# Patient Record
Sex: Female | Born: 1935 | Race: Black or African American | Hispanic: No | Smoking: Never smoker
Health system: Southern US, Community
[De-identification: ages and names within clinical notes are randomized; demographics above are authoritative.]

## PROBLEM LIST (undated history)

## (undated) DIAGNOSIS — G8929 Other chronic pain: Secondary | ICD-10-CM

## (undated) DIAGNOSIS — Z0000 Encounter for general adult medical examination without abnormal findings: Secondary | ICD-10-CM

## (undated) DIAGNOSIS — N189 Chronic kidney disease, unspecified: Secondary | ICD-10-CM

## (undated) DIAGNOSIS — F5104 Psychophysiologic insomnia: Secondary | ICD-10-CM

## (undated) DIAGNOSIS — M25569 Pain in unspecified knee: Secondary | ICD-10-CM

## (undated) DIAGNOSIS — E1142 Type 2 diabetes mellitus with diabetic polyneuropathy: Secondary | ICD-10-CM

## (undated) DIAGNOSIS — M79642 Pain in left hand: Secondary | ICD-10-CM

## (undated) DIAGNOSIS — I10 Essential (primary) hypertension: Secondary | ICD-10-CM

## (undated) DIAGNOSIS — K2970 Gastritis, unspecified, without bleeding: Secondary | ICD-10-CM

## (undated) DIAGNOSIS — R42 Dizziness and giddiness: Secondary | ICD-10-CM

## (undated) DIAGNOSIS — E785 Hyperlipidemia, unspecified: Secondary | ICD-10-CM

## (undated) DIAGNOSIS — M79641 Pain in right hand: Secondary | ICD-10-CM

## (undated) DIAGNOSIS — M25562 Pain in left knee: Secondary | ICD-10-CM

## (undated) DIAGNOSIS — D649 Anemia, unspecified: Secondary | ICD-10-CM

## (undated) DIAGNOSIS — Z8639 Personal history of other endocrine, nutritional and metabolic disease: Principal | ICD-10-CM

## (undated) DIAGNOSIS — M1990 Unspecified osteoarthritis, unspecified site: Secondary | ICD-10-CM

## (undated) HISTORY — DX: Essential (primary) hypertension: I10

## (undated) HISTORY — DX: Hyperlipidemia, unspecified: E78.5

## (undated) HISTORY — DX: Pain in left hand: M79.642

## (undated) HISTORY — DX: Anemia, unspecified: D64.9

## (undated) HISTORY — DX: Psychophysiologic insomnia: F51.04

## (undated) HISTORY — DX: Gastritis, unspecified, without bleeding: K29.70

## (undated) HISTORY — DX: Unspecified osteoarthritis, unspecified site: M19.90

## (undated) HISTORY — DX: Type 2 diabetes mellitus with diabetic polyneuropathy: E11.42

## (undated) HISTORY — DX: Personal history of other endocrine, nutritional and metabolic disease: Z86.39

## (undated) HISTORY — DX: Pain in unspecified knee: M25.569

## (undated) HISTORY — DX: Pain in left knee: M25.562

## (undated) HISTORY — DX: Pain in right hand: M79.641

## (undated) HISTORY — PX: COLONOSCOPY: SHX174

## (undated) HISTORY — DX: Dizziness and giddiness: R42

## (undated) HISTORY — DX: Chronic kidney disease, unspecified: N18.9

## (undated) HISTORY — DX: Other chronic pain: G89.29

## (undated) HISTORY — DX: Encounter for general adult medical examination without abnormal findings: Z00.00

## (undated) HISTORY — PX: ABDOMINAL HYSTERECTOMY: SHX81

---

## 1997-12-09 ENCOUNTER — Ambulatory Visit (HOSPITAL_BASED_OUTPATIENT_CLINIC_OR_DEPARTMENT_OTHER): Admission: RE | Admit: 1997-12-09 | Discharge: 1997-12-09 | Payer: Self-pay | Admitting: Orthopedic Surgery

## 1998-01-31 ENCOUNTER — Encounter: Admission: RE | Admit: 1998-01-31 | Discharge: 1998-01-31 | Payer: Self-pay | Admitting: Internal Medicine

## 1998-04-12 ENCOUNTER — Encounter: Admission: RE | Admit: 1998-04-12 | Discharge: 1998-04-12 | Payer: Self-pay | Admitting: Hematology and Oncology

## 1998-05-10 ENCOUNTER — Encounter: Admission: RE | Admit: 1998-05-10 | Discharge: 1998-05-10 | Payer: Self-pay | Admitting: Hematology and Oncology

## 1998-06-23 ENCOUNTER — Encounter: Admission: RE | Admit: 1998-06-23 | Discharge: 1998-06-23 | Payer: Self-pay | Admitting: Internal Medicine

## 1998-07-18 ENCOUNTER — Ambulatory Visit (HOSPITAL_COMMUNITY): Admission: RE | Admit: 1998-07-18 | Discharge: 1998-07-18 | Payer: Self-pay | Admitting: *Deleted

## 1998-07-24 ENCOUNTER — Ambulatory Visit (HOSPITAL_COMMUNITY): Admission: RE | Admit: 1998-07-24 | Discharge: 1998-07-24 | Payer: Self-pay

## 1998-11-21 ENCOUNTER — Emergency Department (HOSPITAL_COMMUNITY): Admission: EM | Admit: 1998-11-21 | Discharge: 1998-11-21 | Payer: Self-pay | Admitting: Emergency Medicine

## 1998-11-22 ENCOUNTER — Emergency Department (HOSPITAL_COMMUNITY): Admission: EM | Admit: 1998-11-22 | Discharge: 1998-11-22 | Payer: Self-pay | Admitting: Emergency Medicine

## 1999-07-24 ENCOUNTER — Encounter: Admission: RE | Admit: 1999-07-24 | Discharge: 1999-07-24 | Payer: Self-pay | Admitting: Internal Medicine

## 1999-07-27 ENCOUNTER — Encounter: Payer: Self-pay | Admitting: *Deleted

## 1999-07-27 ENCOUNTER — Ambulatory Visit (HOSPITAL_COMMUNITY): Admission: RE | Admit: 1999-07-27 | Discharge: 1999-07-27 | Payer: Self-pay | Admitting: *Deleted

## 1999-08-07 ENCOUNTER — Encounter: Admission: RE | Admit: 1999-08-07 | Discharge: 1999-08-07 | Payer: Self-pay | Admitting: Hematology and Oncology

## 2000-01-14 ENCOUNTER — Encounter: Admission: RE | Admit: 2000-01-14 | Discharge: 2000-01-14 | Payer: Self-pay | Admitting: Internal Medicine

## 2000-07-22 ENCOUNTER — Encounter: Admission: RE | Admit: 2000-07-22 | Discharge: 2000-07-22 | Payer: Self-pay | Admitting: Internal Medicine

## 2000-07-22 ENCOUNTER — Encounter: Payer: Self-pay | Admitting: Internal Medicine

## 2000-07-22 ENCOUNTER — Ambulatory Visit (HOSPITAL_COMMUNITY): Admission: RE | Admit: 2000-07-22 | Discharge: 2000-07-22 | Payer: Self-pay | Admitting: Internal Medicine

## 2000-08-04 ENCOUNTER — Encounter: Admission: RE | Admit: 2000-08-04 | Discharge: 2000-08-04 | Payer: Self-pay | Admitting: Internal Medicine

## 2001-01-05 ENCOUNTER — Emergency Department (HOSPITAL_COMMUNITY): Admission: EM | Admit: 2001-01-05 | Discharge: 2001-01-05 | Payer: Self-pay | Admitting: Emergency Medicine

## 2001-04-17 ENCOUNTER — Encounter: Admission: RE | Admit: 2001-04-17 | Discharge: 2001-04-17 | Payer: Self-pay | Admitting: Internal Medicine

## 2002-05-12 ENCOUNTER — Encounter: Admission: RE | Admit: 2002-05-12 | Discharge: 2002-05-12 | Payer: Self-pay | Admitting: Internal Medicine

## 2002-05-12 ENCOUNTER — Ambulatory Visit (HOSPITAL_COMMUNITY): Admission: RE | Admit: 2002-05-12 | Discharge: 2002-05-12 | Payer: Self-pay | Admitting: Internal Medicine

## 2002-05-31 ENCOUNTER — Encounter: Admission: RE | Admit: 2002-05-31 | Discharge: 2002-05-31 | Payer: Self-pay | Admitting: Internal Medicine

## 2002-06-21 ENCOUNTER — Encounter: Admission: RE | Admit: 2002-06-21 | Discharge: 2002-06-21 | Payer: Self-pay | Admitting: Internal Medicine

## 2002-07-16 ENCOUNTER — Encounter: Admission: RE | Admit: 2002-07-16 | Discharge: 2002-07-16 | Payer: Self-pay | Admitting: Internal Medicine

## 2002-11-15 ENCOUNTER — Encounter: Admission: RE | Admit: 2002-11-15 | Discharge: 2002-11-15 | Payer: Self-pay | Admitting: Internal Medicine

## 2003-01-24 ENCOUNTER — Encounter: Admission: RE | Admit: 2003-01-24 | Discharge: 2003-01-24 | Payer: Self-pay | Admitting: Internal Medicine

## 2003-02-07 ENCOUNTER — Encounter: Admission: RE | Admit: 2003-02-07 | Discharge: 2003-02-07 | Payer: Self-pay | Admitting: Internal Medicine

## 2003-02-17 ENCOUNTER — Encounter: Admission: RE | Admit: 2003-02-17 | Discharge: 2003-02-17 | Payer: Self-pay | Admitting: Internal Medicine

## 2004-02-07 ENCOUNTER — Ambulatory Visit: Payer: Self-pay | Admitting: Internal Medicine

## 2004-02-20 ENCOUNTER — Ambulatory Visit: Payer: Self-pay | Admitting: Internal Medicine

## 2004-02-20 ENCOUNTER — Ambulatory Visit (HOSPITAL_COMMUNITY): Admission: RE | Admit: 2004-02-20 | Discharge: 2004-02-20 | Payer: Self-pay | Admitting: Internal Medicine

## 2004-02-21 ENCOUNTER — Ambulatory Visit: Payer: Self-pay | Admitting: Internal Medicine

## 2004-03-05 ENCOUNTER — Ambulatory Visit: Payer: Self-pay | Admitting: Internal Medicine

## 2004-04-07 ENCOUNTER — Emergency Department (HOSPITAL_COMMUNITY): Admission: EM | Admit: 2004-04-07 | Discharge: 2004-04-08 | Payer: Self-pay

## 2004-07-09 ENCOUNTER — Ambulatory Visit: Payer: Self-pay | Admitting: Internal Medicine

## 2004-07-12 ENCOUNTER — Ambulatory Visit: Payer: Self-pay | Admitting: Internal Medicine

## 2004-10-11 ENCOUNTER — Ambulatory Visit: Payer: Self-pay | Admitting: Internal Medicine

## 2004-10-11 ENCOUNTER — Ambulatory Visit (HOSPITAL_COMMUNITY): Admission: RE | Admit: 2004-10-11 | Discharge: 2004-10-11 | Payer: Self-pay | Admitting: Internal Medicine

## 2004-10-22 ENCOUNTER — Ambulatory Visit: Payer: Self-pay | Admitting: Internal Medicine

## 2004-10-25 ENCOUNTER — Ambulatory Visit: Payer: Self-pay | Admitting: Internal Medicine

## 2004-11-12 ENCOUNTER — Ambulatory Visit: Payer: Self-pay | Admitting: Internal Medicine

## 2004-11-14 ENCOUNTER — Ambulatory Visit (HOSPITAL_COMMUNITY): Admission: RE | Admit: 2004-11-14 | Discharge: 2004-11-14 | Payer: Self-pay | Admitting: Internal Medicine

## 2004-11-20 ENCOUNTER — Ambulatory Visit: Payer: Self-pay | Admitting: Gastroenterology

## 2004-11-20 ENCOUNTER — Encounter (INDEPENDENT_AMBULATORY_CARE_PROVIDER_SITE_OTHER): Payer: Self-pay | Admitting: *Deleted

## 2004-11-26 ENCOUNTER — Ambulatory Visit: Payer: Self-pay | Admitting: Internal Medicine

## 2004-12-06 ENCOUNTER — Encounter (INDEPENDENT_AMBULATORY_CARE_PROVIDER_SITE_OTHER): Payer: Self-pay | Admitting: *Deleted

## 2004-12-06 ENCOUNTER — Ambulatory Visit: Payer: Self-pay | Admitting: Gastroenterology

## 2004-12-13 ENCOUNTER — Ambulatory Visit: Payer: Self-pay | Admitting: Internal Medicine

## 2004-12-27 ENCOUNTER — Ambulatory Visit (HOSPITAL_COMMUNITY): Admission: RE | Admit: 2004-12-27 | Discharge: 2004-12-27 | Payer: Self-pay | Admitting: Gastroenterology

## 2005-01-01 ENCOUNTER — Encounter (INDEPENDENT_AMBULATORY_CARE_PROVIDER_SITE_OTHER): Payer: Self-pay | Admitting: *Deleted

## 2005-05-23 ENCOUNTER — Ambulatory Visit: Payer: Self-pay | Admitting: Internal Medicine

## 2005-05-31 ENCOUNTER — Ambulatory Visit: Payer: Self-pay | Admitting: Internal Medicine

## 2005-09-22 ENCOUNTER — Emergency Department (HOSPITAL_COMMUNITY): Admission: EM | Admit: 2005-09-22 | Discharge: 2005-09-22 | Payer: Self-pay | Admitting: Emergency Medicine

## 2006-02-13 DIAGNOSIS — E785 Hyperlipidemia, unspecified: Secondary | ICD-10-CM | POA: Insufficient documentation

## 2006-02-13 DIAGNOSIS — R42 Dizziness and giddiness: Secondary | ICD-10-CM

## 2006-02-13 DIAGNOSIS — E1140 Type 2 diabetes mellitus with diabetic neuropathy, unspecified: Secondary | ICD-10-CM | POA: Insufficient documentation

## 2006-02-13 DIAGNOSIS — I10 Essential (primary) hypertension: Secondary | ICD-10-CM | POA: Insufficient documentation

## 2006-02-13 DIAGNOSIS — K2990 Gastroduodenitis, unspecified, without bleeding: Secondary | ICD-10-CM

## 2006-02-13 DIAGNOSIS — M1990 Unspecified osteoarthritis, unspecified site: Secondary | ICD-10-CM | POA: Insufficient documentation

## 2006-02-13 DIAGNOSIS — K2970 Gastritis, unspecified, without bleeding: Secondary | ICD-10-CM | POA: Insufficient documentation

## 2006-03-07 ENCOUNTER — Encounter (INDEPENDENT_AMBULATORY_CARE_PROVIDER_SITE_OTHER): Payer: Self-pay | Admitting: *Deleted

## 2006-03-07 ENCOUNTER — Ambulatory Visit: Payer: Self-pay | Admitting: Internal Medicine

## 2006-03-07 LAB — CONVERTED CEMR LAB
Cholesterol: 162 mg/dL (ref 0–200)
Microalb Creat Ratio: 7.1 mg/g (ref 0.0–30.0)
Total CHOL/HDL Ratio: 2.7
Urine Glucose: NEGATIVE mg/dL
WBC, UA: NONE SEEN cells/hpf (ref <3)
pH: 6.5 (ref 5.0–8.0)

## 2006-03-21 ENCOUNTER — Ambulatory Visit (HOSPITAL_COMMUNITY): Admission: RE | Admit: 2006-03-21 | Discharge: 2006-03-21 | Payer: Self-pay | Admitting: Internal Medicine

## 2006-03-24 ENCOUNTER — Ambulatory Visit: Payer: Self-pay | Admitting: Hospitalist

## 2006-03-24 ENCOUNTER — Encounter (INDEPENDENT_AMBULATORY_CARE_PROVIDER_SITE_OTHER): Payer: Self-pay | Admitting: *Deleted

## 2006-03-24 LAB — CONVERTED CEMR LAB
Platelets: 223 10*3/uL (ref 152–374)
WBC: 5.3 10*3/uL (ref 3.7–10.0)

## 2006-07-28 ENCOUNTER — Telehealth (INDEPENDENT_AMBULATORY_CARE_PROVIDER_SITE_OTHER): Payer: Self-pay | Admitting: *Deleted

## 2006-10-20 ENCOUNTER — Telehealth: Payer: Self-pay | Admitting: *Deleted

## 2006-10-20 DIAGNOSIS — D649 Anemia, unspecified: Secondary | ICD-10-CM | POA: Insufficient documentation

## 2006-11-14 ENCOUNTER — Ambulatory Visit: Payer: Self-pay | Admitting: Hospitalist

## 2006-11-14 ENCOUNTER — Encounter (INDEPENDENT_AMBULATORY_CARE_PROVIDER_SITE_OTHER): Payer: Self-pay | Admitting: *Deleted

## 2006-11-14 LAB — CONVERTED CEMR LAB
Bilirubin Urine: NEGATIVE
Glucose, Urine, Semiquant: NEGATIVE
Hgb A1c MFr Bld: 6.1 %
Specific Gravity, Urine: <1.005

## 2007-03-03 ENCOUNTER — Encounter (INDEPENDENT_AMBULATORY_CARE_PROVIDER_SITE_OTHER): Payer: Self-pay | Admitting: *Deleted

## 2007-03-03 ENCOUNTER — Ambulatory Visit: Payer: Self-pay | Admitting: Hospitalist

## 2007-03-03 LAB — CONVERTED CEMR LAB
ALT: 29 units/L (ref 0–35)
BUN: 20 mg/dL (ref 6–23)
Basophils Absolute: 0 10*3/uL (ref 0.0–0.1)
Basophils Relative: 1 % (ref 0–1)
Bilirubin Urine: NEGATIVE
CO2: 22 meq/L (ref 19–32)
Cholesterol: 151 mg/dL (ref 0–200)
Creatinine, Ser: 1.52 mg/dL — ABNORMAL HIGH (ref 0.40–1.20)
Eosinophils Relative: 5 % (ref 0–5)
HCT: 33.3 % — ABNORMAL LOW (ref 36.0–46.0)
HDL: 53 mg/dL (ref >39)
Hemoglobin, Urine: NEGATIVE
Hemoglobin: 10.9 g/dL — ABNORMAL LOW (ref 12.0–15.0)
Hgb A1c MFr Bld: 5.9 %
Ketones, ur: NEGATIVE mg/dL
Lymphocytes Relative: 29 % (ref 12–46)
MCHC: 32.7 g/dL (ref 30.0–36.0)
Monocytes Absolute: 0.5 10*3/uL (ref 0.2–0.7)
Protein, ur: NEGATIVE mg/dL
RDW: 13.7 % (ref 11.5–14.0)
Total Bilirubin: 0.5 mg/dL (ref 0.3–1.2)
Total CHOL/HDL Ratio: 2.8
Urine Glucose: NEGATIVE mg/dL
VLDL: 17 mg/dL (ref 0–40)

## 2007-03-06 ENCOUNTER — Ambulatory Visit (HOSPITAL_COMMUNITY): Admission: RE | Admit: 2007-03-06 | Discharge: 2007-03-06 | Payer: Self-pay | Admitting: *Deleted

## 2007-03-06 ENCOUNTER — Ambulatory Visit: Payer: Self-pay | Admitting: *Deleted

## 2007-03-06 LAB — CONVERTED CEMR LAB
Blood Glucose, Fingerstick: 103
Blood Glucose, Home Monitor: 1 mg/dL

## 2007-05-14 ENCOUNTER — Telehealth (INDEPENDENT_AMBULATORY_CARE_PROVIDER_SITE_OTHER): Payer: Self-pay | Admitting: *Deleted

## 2007-06-10 ENCOUNTER — Telehealth (INDEPENDENT_AMBULATORY_CARE_PROVIDER_SITE_OTHER): Payer: Self-pay | Admitting: *Deleted

## 2007-06-15 ENCOUNTER — Telehealth (INDEPENDENT_AMBULATORY_CARE_PROVIDER_SITE_OTHER): Payer: Self-pay | Admitting: *Deleted

## 2007-06-18 ENCOUNTER — Ambulatory Visit: Payer: Self-pay | Admitting: Internal Medicine

## 2007-06-18 ENCOUNTER — Encounter (INDEPENDENT_AMBULATORY_CARE_PROVIDER_SITE_OTHER): Payer: Self-pay | Admitting: *Deleted

## 2007-06-18 LAB — CONVERTED CEMR LAB
Blood Glucose, Fingerstick: 105
Hgb A1c MFr Bld: 5.6 %

## 2007-08-15 ENCOUNTER — Emergency Department (HOSPITAL_COMMUNITY): Admission: EM | Admit: 2007-08-15 | Discharge: 2007-08-15 | Payer: Self-pay | Admitting: Emergency Medicine

## 2007-10-12 ENCOUNTER — Telehealth (INDEPENDENT_AMBULATORY_CARE_PROVIDER_SITE_OTHER): Payer: Self-pay | Admitting: *Deleted

## 2007-11-30 ENCOUNTER — Ambulatory Visit (HOSPITAL_COMMUNITY): Admission: RE | Admit: 2007-11-30 | Discharge: 2007-11-30 | Payer: Self-pay | Admitting: Infectious Diseases

## 2007-11-30 ENCOUNTER — Ambulatory Visit: Payer: Self-pay | Admitting: Infectious Diseases

## 2007-11-30 ENCOUNTER — Encounter: Payer: Self-pay | Admitting: Internal Medicine

## 2007-11-30 DIAGNOSIS — M25569 Pain in unspecified knee: Secondary | ICD-10-CM | POA: Insufficient documentation

## 2007-11-30 LAB — CONVERTED CEMR LAB
Alkaline Phosphatase: 83 units/L (ref 39–117)
BUN: 28 mg/dL — ABNORMAL HIGH (ref 6–23)
CO2: 21 meq/L (ref 19–32)
Glucose, Bld: 99 mg/dL (ref 70–99)
Total Bilirubin: 0.4 mg/dL (ref 0.3–1.2)

## 2007-12-11 ENCOUNTER — Ambulatory Visit (HOSPITAL_COMMUNITY): Admission: RE | Admit: 2007-12-11 | Discharge: 2007-12-11 | Payer: Self-pay | Admitting: Internal Medicine

## 2008-03-17 ENCOUNTER — Telehealth (INDEPENDENT_AMBULATORY_CARE_PROVIDER_SITE_OTHER): Payer: Self-pay | Admitting: Internal Medicine

## 2008-07-18 ENCOUNTER — Telehealth: Payer: Self-pay | Admitting: Internal Medicine

## 2008-08-09 ENCOUNTER — Encounter: Payer: Self-pay | Admitting: Internal Medicine

## 2008-08-09 ENCOUNTER — Ambulatory Visit: Payer: Self-pay | Admitting: Internal Medicine

## 2008-08-09 LAB — CONVERTED CEMR LAB
Blood Glucose, AC Bkfst: 85 mg/dL
Hgb A1c MFr Bld: 5.8 %

## 2008-08-12 LAB — CONVERTED CEMR LAB
CO2: 22 meq/L (ref 19–32)
Calcium: 9.6 mg/dL (ref 8.4–10.5)
Chloride: 105 meq/L (ref 96–112)
Cholesterol: 167 mg/dL (ref 0–200)
Creatinine, Ser: 1.23 mg/dL — ABNORMAL HIGH (ref 0.40–1.20)
Creatinine, Urine: 82.4 mg/dL
GFR calc non Af Amer: 42 mL/min — ABNORMAL LOW (ref >60)
LDL Cholesterol: 89 mg/dL (ref 0–99)
Microalb, Ur: 0.5 mg/dL (ref 0.00–1.89)
Sodium: 140 meq/L (ref 135–145)
Triglycerides: 87 mg/dL (ref <150)
VLDL: 17 mg/dL (ref 0–40)

## 2008-08-26 ENCOUNTER — Ambulatory Visit: Payer: Self-pay | Admitting: Internal Medicine

## 2008-08-26 ENCOUNTER — Encounter (INDEPENDENT_AMBULATORY_CARE_PROVIDER_SITE_OTHER): Payer: Self-pay | Admitting: Internal Medicine

## 2008-08-29 DIAGNOSIS — N182 Chronic kidney disease, stage 2 (mild): Secondary | ICD-10-CM | POA: Insufficient documentation

## 2008-08-29 LAB — CONVERTED CEMR LAB
CO2: 25 meq/L (ref 19–32)
Chloride: 108 meq/L (ref 96–112)
Creatinine, Ser: 1.39 mg/dL — ABNORMAL HIGH (ref 0.40–1.20)
GFR calc non Af Amer: 37 mL/min — ABNORMAL LOW (ref >60)
Potassium: 4.7 meq/L (ref 3.5–5.3)

## 2008-08-30 ENCOUNTER — Encounter: Payer: Self-pay | Admitting: Internal Medicine

## 2008-08-30 ENCOUNTER — Telehealth (INDEPENDENT_AMBULATORY_CARE_PROVIDER_SITE_OTHER): Payer: Self-pay | Admitting: Internal Medicine

## 2008-09-02 ENCOUNTER — Ambulatory Visit: Payer: Self-pay | Admitting: Sports Medicine

## 2008-09-02 ENCOUNTER — Encounter (INDEPENDENT_AMBULATORY_CARE_PROVIDER_SITE_OTHER): Payer: Self-pay | Admitting: *Deleted

## 2008-09-28 ENCOUNTER — Telehealth (INDEPENDENT_AMBULATORY_CARE_PROVIDER_SITE_OTHER): Payer: Self-pay | Admitting: Internal Medicine

## 2008-10-03 ENCOUNTER — Encounter (INDEPENDENT_AMBULATORY_CARE_PROVIDER_SITE_OTHER): Payer: Self-pay | Admitting: Internal Medicine

## 2008-10-03 ENCOUNTER — Encounter: Payer: Self-pay | Admitting: Internal Medicine

## 2008-10-03 ENCOUNTER — Ambulatory Visit: Payer: Self-pay | Admitting: Internal Medicine

## 2008-10-04 LAB — CONVERTED CEMR LAB
BUN: 27 mg/dL — ABNORMAL HIGH
CO2: 22 meq/L
Calcium: 9.3 mg/dL
Chloride: 109 meq/L
Creatinine, Ser: 1.53 mg/dL — ABNORMAL HIGH
GFR calc Af Amer: 40 mL/min — ABNORMAL LOW
GFR calc non Af Amer: 33 mL/min — ABNORMAL LOW
Glucose, Bld: 99 mg/dL
Potassium: 4.4 meq/L
Sodium: 143 meq/L

## 2008-10-05 ENCOUNTER — Telehealth: Payer: Self-pay | Admitting: Internal Medicine

## 2008-10-06 ENCOUNTER — Telehealth: Payer: Self-pay | Admitting: *Deleted

## 2008-10-17 ENCOUNTER — Ambulatory Visit: Payer: Self-pay | Admitting: Internal Medicine

## 2008-10-17 ENCOUNTER — Encounter: Payer: Self-pay | Admitting: Internal Medicine

## 2008-10-17 LAB — CONVERTED CEMR LAB
BUN: 16 mg/dL (ref 6–23)
CO2: 26 meq/L (ref 19–32)
Chloride: 110 meq/L (ref 96–112)
Glucose, Bld: 98 mg/dL (ref 70–99)
Potassium: 3.8 meq/L (ref 3.5–5.3)
Sodium: 142 meq/L (ref 135–145)

## 2008-11-23 ENCOUNTER — Telehealth: Payer: Self-pay | Admitting: Internal Medicine

## 2008-11-25 ENCOUNTER — Ambulatory Visit (HOSPITAL_COMMUNITY): Admission: RE | Admit: 2008-11-25 | Discharge: 2008-11-25 | Payer: Self-pay | Admitting: Internal Medicine

## 2008-11-25 ENCOUNTER — Ambulatory Visit: Payer: Self-pay | Admitting: Internal Medicine

## 2008-11-25 ENCOUNTER — Ambulatory Visit: Payer: Self-pay | Admitting: *Deleted

## 2008-11-25 ENCOUNTER — Encounter: Payer: Self-pay | Admitting: Internal Medicine

## 2008-11-25 DIAGNOSIS — H60339 Swimmer's ear, unspecified ear: Secondary | ICD-10-CM | POA: Insufficient documentation

## 2008-11-25 DIAGNOSIS — R609 Edema, unspecified: Secondary | ICD-10-CM

## 2008-11-25 LAB — CONVERTED CEMR LAB: Blood Glucose, Fingerstick: 81

## 2008-11-28 ENCOUNTER — Ambulatory Visit: Payer: Self-pay | Admitting: Internal Medicine

## 2008-11-28 LAB — CONVERTED CEMR LAB
Blood Glucose, Fingerstick: 75
Hgb A1c MFr Bld: 5.9 %

## 2008-11-29 ENCOUNTER — Telehealth: Payer: Self-pay | Admitting: Internal Medicine

## 2008-11-30 ENCOUNTER — Encounter: Payer: Self-pay | Admitting: Internal Medicine

## 2009-01-05 ENCOUNTER — Ambulatory Visit (HOSPITAL_COMMUNITY): Admission: RE | Admit: 2009-01-05 | Discharge: 2009-01-05 | Payer: Self-pay | Admitting: Internal Medicine

## 2009-01-13 ENCOUNTER — Encounter: Admission: RE | Admit: 2009-01-13 | Discharge: 2009-01-13 | Payer: Self-pay | Admitting: Internal Medicine

## 2009-02-13 ENCOUNTER — Telehealth: Payer: Self-pay | Admitting: Internal Medicine

## 2009-02-17 ENCOUNTER — Encounter: Payer: Self-pay | Admitting: Internal Medicine

## 2009-03-13 ENCOUNTER — Telehealth: Payer: Self-pay | Admitting: Internal Medicine

## 2009-03-14 ENCOUNTER — Telehealth: Payer: Self-pay | Admitting: Internal Medicine

## 2009-04-17 ENCOUNTER — Telehealth: Payer: Self-pay | Admitting: Internal Medicine

## 2009-04-19 ENCOUNTER — Telehealth: Payer: Self-pay | Admitting: Internal Medicine

## 2009-05-17 ENCOUNTER — Telehealth: Payer: Self-pay | Admitting: Internal Medicine

## 2009-06-07 ENCOUNTER — Telehealth: Payer: Self-pay | Admitting: *Deleted

## 2009-06-13 ENCOUNTER — Ambulatory Visit: Payer: Self-pay | Admitting: Internal Medicine

## 2009-06-13 LAB — CONVERTED CEMR LAB
Bilirubin Urine: NEGATIVE
Glucose, Urine, Semiquant: NEGATIVE
Hgb A1c MFr Bld: 6.3 %
Protein, U semiquant: NEGATIVE
Specific Gravity, Urine: 1.005
WBC Urine, dipstick: NEGATIVE
pH: 5

## 2009-06-14 LAB — CONVERTED CEMR LAB
ALT: 13 units/L (ref 0–35)
AST: 18 units/L (ref 0–37)
Calcium: 9.5 mg/dL (ref 8.4–10.5)
Chloride: 104 meq/L (ref 96–112)
Creatinine, Ser: 1.33 mg/dL — ABNORMAL HIGH (ref 0.40–1.20)
HCT: 34.4 % — ABNORMAL LOW (ref 36.0–46.0)
Platelets: 254 10*3/uL (ref 150–400)
RDW: 13.9 % (ref 11.5–15.5)
Sodium: 141 meq/L (ref 135–145)
TSH: 2.611 microintl units/mL (ref 0.350–4.5)
Total Bilirubin: 0.4 mg/dL (ref 0.3–1.2)
Total CHOL/HDL Ratio: 2.7
Total Protein: 7.7 g/dL (ref 6.0–8.3)
VLDL: 22 mg/dL (ref 0–40)
WBC: 4.9 10*3/uL (ref 4.0–10.5)

## 2009-07-20 ENCOUNTER — Telehealth: Payer: Self-pay | Admitting: Internal Medicine

## 2009-08-14 ENCOUNTER — Telehealth: Payer: Self-pay | Admitting: Internal Medicine

## 2009-09-14 ENCOUNTER — Telehealth: Payer: Self-pay | Admitting: Internal Medicine

## 2009-09-15 ENCOUNTER — Telehealth: Payer: Self-pay | Admitting: Internal Medicine

## 2009-09-20 ENCOUNTER — Ambulatory Visit: Payer: Self-pay | Admitting: Internal Medicine

## 2009-09-20 LAB — CONVERTED CEMR LAB
Blood Glucose, AC Bkfst: 105 mg/dL
Hgb A1c MFr Bld: 6.3 %

## 2009-09-21 ENCOUNTER — Encounter: Payer: Self-pay | Admitting: Internal Medicine

## 2009-09-27 ENCOUNTER — Ambulatory Visit (HOSPITAL_COMMUNITY): Admission: RE | Admit: 2009-09-27 | Discharge: 2009-09-27 | Payer: Self-pay | Admitting: Internal Medicine

## 2009-09-28 ENCOUNTER — Encounter: Payer: Self-pay | Admitting: Internal Medicine

## 2009-09-29 ENCOUNTER — Telehealth: Payer: Self-pay | Admitting: *Deleted

## 2009-09-29 DIAGNOSIS — M810 Age-related osteoporosis without current pathological fracture: Secondary | ICD-10-CM | POA: Insufficient documentation

## 2009-10-06 ENCOUNTER — Telehealth: Payer: Self-pay | Admitting: Internal Medicine

## 2009-10-19 ENCOUNTER — Ambulatory Visit: Payer: Self-pay | Admitting: Internal Medicine

## 2009-10-19 DIAGNOSIS — E1149 Type 2 diabetes mellitus with other diabetic neurological complication: Secondary | ICD-10-CM | POA: Insufficient documentation

## 2009-10-25 ENCOUNTER — Telehealth: Payer: Self-pay | Admitting: Internal Medicine

## 2009-10-25 ENCOUNTER — Encounter (INDEPENDENT_AMBULATORY_CARE_PROVIDER_SITE_OTHER): Payer: Self-pay | Admitting: *Deleted

## 2009-10-26 ENCOUNTER — Ambulatory Visit: Payer: Self-pay | Admitting: Internal Medicine

## 2009-10-27 ENCOUNTER — Telehealth: Payer: Self-pay | Admitting: Internal Medicine

## 2009-10-27 LAB — CONVERTED CEMR LAB
Albumin: 4.3 g/dL (ref 3.5–5.2)
BUN: 23 mg/dL (ref 6–23)
Calcium: 9.5 mg/dL (ref 8.4–10.5)
Chloride: 104 meq/L (ref 96–112)
Glucose, Bld: 105 mg/dL — ABNORMAL HIGH (ref 70–99)
HDL: 48 mg/dL (ref >39)
Hemoglobin: 11.1 g/dL — ABNORMAL LOW (ref 12.0–15.0)
LDL Cholesterol: 73 mg/dL (ref 0–99)
Potassium: 4.3 meq/L (ref 3.5–5.3)
RBC: 3.55 M/uL — ABNORMAL LOW (ref 3.87–5.11)
Total CHOL/HDL Ratio: 3
Triglycerides: 111 mg/dL (ref <150)
VLDL: 22 mg/dL (ref 0–40)
WBC: 4.4 10*3/uL (ref 4.0–10.5)

## 2009-10-31 ENCOUNTER — Encounter: Payer: Self-pay | Admitting: Internal Medicine

## 2009-11-03 ENCOUNTER — Telehealth: Payer: Self-pay | Admitting: Internal Medicine

## 2009-11-08 ENCOUNTER — Telehealth: Payer: Self-pay | Admitting: *Deleted

## 2009-11-09 ENCOUNTER — Telehealth: Payer: Self-pay | Admitting: *Deleted

## 2009-11-13 ENCOUNTER — Encounter (INDEPENDENT_AMBULATORY_CARE_PROVIDER_SITE_OTHER): Payer: Self-pay | Admitting: *Deleted

## 2009-11-15 ENCOUNTER — Ambulatory Visit: Payer: Self-pay | Admitting: Gastroenterology

## 2009-11-15 ENCOUNTER — Telehealth: Payer: Self-pay | Admitting: Internal Medicine

## 2009-11-15 ENCOUNTER — Encounter (INDEPENDENT_AMBULATORY_CARE_PROVIDER_SITE_OTHER): Payer: Self-pay | Admitting: *Deleted

## 2009-11-29 ENCOUNTER — Ambulatory Visit: Payer: Self-pay | Admitting: Gastroenterology

## 2009-12-29 ENCOUNTER — Telehealth: Payer: Self-pay | Admitting: Internal Medicine

## 2010-02-20 ENCOUNTER — Ambulatory Visit (HOSPITAL_COMMUNITY): Admission: RE | Admit: 2010-02-20 | Discharge: 2010-02-20 | Payer: Self-pay | Admitting: Internal Medicine

## 2010-03-05 ENCOUNTER — Encounter: Payer: Self-pay | Admitting: Internal Medicine

## 2010-03-06 ENCOUNTER — Encounter: Admission: RE | Admit: 2010-03-06 | Discharge: 2010-03-06 | Payer: Self-pay | Admitting: Internal Medicine

## 2010-03-12 ENCOUNTER — Telehealth: Payer: Self-pay | Admitting: Internal Medicine

## 2010-03-12 ENCOUNTER — Encounter: Admission: RE | Admit: 2010-03-12 | Discharge: 2010-03-12 | Payer: Self-pay | Admitting: Internal Medicine

## 2010-03-12 ENCOUNTER — Encounter: Payer: Self-pay | Admitting: Internal Medicine

## 2010-03-13 ENCOUNTER — Telehealth: Payer: Self-pay | Admitting: Internal Medicine

## 2010-03-29 HISTORY — PX: OTHER SURGICAL HISTORY: SHX169

## 2010-04-16 ENCOUNTER — Encounter
Admission: RE | Admit: 2010-04-16 | Discharge: 2010-04-16 | Payer: Self-pay | Source: Home / Self Care | Attending: Surgery | Admitting: Surgery

## 2010-04-16 ENCOUNTER — Ambulatory Visit (HOSPITAL_COMMUNITY)
Admission: RE | Admit: 2010-04-16 | Discharge: 2010-04-16 | Payer: Self-pay | Source: Home / Self Care | Attending: Surgery | Admitting: Surgery

## 2010-05-03 ENCOUNTER — Ambulatory Visit: Admission: RE | Admit: 2010-05-03 | Discharge: 2010-05-03 | Payer: Self-pay | Source: Home / Self Care

## 2010-05-03 LAB — GLUCOSE, CAPILLARY: Glucose-Capillary: 112 mg/dL — ABNORMAL HIGH (ref 70–99)

## 2010-05-04 LAB — CONVERTED CEMR LAB
Alkaline Phosphatase: 54 units/L (ref 39–117)
BUN: 29 mg/dL — ABNORMAL HIGH (ref 6–23)
CO2: 25 meq/L (ref 19–32)
Cholesterol: 163 mg/dL (ref 0–200)
Creatinine, Ser: 1.36 mg/dL — ABNORMAL HIGH (ref 0.40–1.20)
Glucose, Bld: 114 mg/dL — ABNORMAL HIGH (ref 70–99)
HDL: 51 mg/dL (ref >39)
LDL Cholesterol: 92 mg/dL (ref 0–99)
Sodium: 142 meq/L (ref 135–145)
Total Bilirubin: 0.5 mg/dL (ref 0.3–1.2)
Total CHOL/HDL Ratio: 3.2
Triglycerides: 100 mg/dL (ref <150)
VLDL: 20 mg/dL (ref 0–40)

## 2010-05-23 ENCOUNTER — Telehealth: Payer: Self-pay | Admitting: Internal Medicine

## 2010-05-29 NOTE — Progress Notes (Signed)
Summary: med refill/gp  Phone Note Refill Request Message from:  Fax from Pharmacy on November 15, 2009 10:58 AM  Refills Requested: Medication #1:  GLUCOPHAGE 1000 MG  TABS Take 1 tablet by mouth two times a day   Last Refilled: 10/16/2009 Last appt. 6/23. Labsdone 6/30.   Method Requested: Electronic Initial call taken by: Chinita Pester RN,  November 15, 2009 10:58 AM    Prescriptions: GLUCOPHAGE 1000 MG  TABS (METFORMIN HCL) Take 1 tablet by mouth two times a day  #60 x 3   Entered and Authorized by:   Laren Everts MD   Signed by:   Laren Everts MD on 11/15/2009   Method used:   [facility]        [facility] [phone]* (retail)       [address]       [place], [place]  [address]       Ph: [phone] or [phone]       Fax: [phone]   RxID:   [id number]

## 2010-05-29 NOTE — Assessment & Plan Note (Signed)
Summary: (VEGA) ARM/SB.   Vital Signs:  Patient profile:   77 year old female Height:      63 inches (160.02 cm) Weight:      189.2 pounds (86.00 kg) BMI:     33.64 Temp:     97.7 degrees F oral Pulse rate:   70 / minute BP sitting:   166 / 80  (right arm)  Vitals Entered By: Chinita Pester RN (Sep 20, 2009 9:11 AM) CC: Right arm tingling also down right leg. Is Patient Diabetic? Yes Did you bring your meter with you today? No Pain Assessment Patient in pain? no      Nutritional Status BMI of > 30 = obese  Have you ever been in a relationship where you felt threatened, hurt or afraid?Unable to ask; someone w/pt.   Does patient need assistance? Functional Status Self care Ambulation Impaired:Risk for fall Comments uses a cane   Diabetic Foot Exam Last Podiatry Exam Date: 09/20/2009  Foot Inspection Is there a history of a foot ulcer?              No Is there a foot ulcer now?              No Can the patient see the bottom of their feet?          No Are the shoes appropriate in style and fit?          Yes Is there swelling or an abnormal foot shape?          No Are the toenails long?                No Are the toenails thick?                No Are the toenails ingrown?              No Is there heavy callous build-up?              No Is there pain in the calf muscle (Intermittent claudication) when walking?    No Diabetic Foot Care Education Patient educated on appropriate care of diabetic feet.  Pulse Check          Right Foot          Left Foot Dorsalis Pedis:        normal            normal    10-g (5.07) Semmes-Weinstein Monofilament Test Performed by: Chinita Pester RN          Right Foot          Left Foot Visual Inspection     normal         normal Test Control      normal         normal Site 1         normal         normal Site 2         normal         normal Site 3         normal         normal Site 4         normal         normal Site 5         normal          normal Site 6         normal  normal Site 7         normal         normal Site 8         normal         normal Site 9         normal         normal   Primary Care Provider:  Laren Everts MD  CC:  Right arm tingling also down right leg.Marland Kitchen  History of Present Illness: Mrs Bigos is a 77 yo woman with PMH as outlined in chart.  She is here for tingling in right arm and leg.  states they are both present simultaneously.  no weakness, weight loss, back/neck pain or trouble with urine/bowel movments.  Depression History:      The patient denies a depressed mood most of the day and a diminished interest in her usual daily activities.         Preventive Screening-Counseling & Management  Alcohol-Tobacco     Alcohol drinks/day: 0     Smoking Status: never  Caffeine-Diet-Exercise     Does Patient Exercise: yes     Type of exercise: ROM  / WALKING     Exercise (avg: min/session): VARIES     Times/week: 5-7  Current Medications (verified): 1)  Lipitor 10 Mg Tabs (Atorvastatin Calcium) .... Take 1 Tablet By Mouth Once Daily 2)  Glucophage 1000 Mg  Tabs (Metformin Hcl) .... Take 1 Tablet By Mouth Two Times A Day 3)  Maxzide-25 37.5-25 Mg Tabs (Triamterene-Hctz) .... Take 1 Tablet By Mouth Once Daily 4)  Omeprazole 20 Mg Cpdr (Omeprazole) .... Take One Capsue By Mouth Once Daily 5)  Fexofenadine Hcl 180 Mg Tabs (Fexofenadine Hcl) .... Take 1 Tablet By Mouth Once Daily 6)  Atenolol 50 Mg Tabs (Atenolol) .... Take 1 Tablet By Mouth Once Daily 7)  Ferrous Sulfate 324 Mg  Tbec (Ferrous Sulfate) .... Take 1 Tablet By Mouth Once A Day 8)  Neurontin 400 Mg Caps (Gabapentin) .... Take 1 Tablet By Mouth Three Times A Day. 9)  Vicodin 5-500 Mg Tabs (Hydrocodone-Acetaminophen) .... Every Six Hours As Needed. 10)  Onetouch Ultra Test  Strp (Glucose Blood) .... Test 3 Times Daily As Directed  Allergies (verified): 1)  ! Lisinopril (Lisinopril)  Past History:  Past Medical  History: Last updated: 03/03/2007 Diabetes mellitus, type II - dx 11/05 Hyperlipidemia Hypertension Anemia      Normal EGG, colonoscopy, and Sm. 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 10th grade level education  Review of Systems      See HPI  Physical Exam  General:  alert, well-developed, cooperative to examination, and overweight-appearing.   Neck:  supple and no carotid bruits.   Lungs:  normal respiratory effort, no accessory muscle use, normal breath sounds, no crackles, and no wheezes.   Heart:  normal rate, regular rhythm, no gallop, and no rub.    Normal S1 and S2.  Grade 2/6 systolic ejection murmur over outflow tract c/w aortic sclerosis/stenosis but concerning features. Abdomen:  soft and normal bowel sounds.   Extremities:  trace edema Neurologic:  alert & oriented X3, cranial nerves II-XII intact, and strength  normal in all extremities.    reports subjective change in right side to light touch vibratory sensation normal bilateral upper ext vibratory sensation decreased bilateral lower ext Psych:  Oriented X3, memory intact for recent and remote, and normally interactive.     Impression & Recommendations:  Problem # 1:  PARESTHESIA (ICD-782.0)  no concerning symptoms or signs to suggest need for imaging, especailly with pt's age and lack for need for intervention. based on history and findings, likely peripheral neuropathy. already on neurontin, adequate dose.  electrolytes recently wnl as was TSH Will check B12  Consider changing to lyrica if symptoms persist and insurance covers.  Orders: T-Vitamin B12  [phone]) T-Magnesium [phone]) T-Phosphorus [phone])  Problem # 2:  PREVENTIVE HEALTH CARE (ICD-V70.0) no need to PAP....s/p hysterectomy mammo up to date will check DEXA colon due later this year  Problem # 3:  HYPERTENSION (ICD-401.9) elevated today, but priors well controlled (goal < 160 due to age) no changes today unless it persists in futre Of note, pt reports possible angioedema to lisinopril (no ACE-I or ARB).  Her updated medication list for this problem includes:    Maxzide-25 37.5-25 Mg Tabs (Triamterene-hctz) .Marland Kitchen... Take 1 tablet by mouth once daily    Atenolol 50 Mg Tabs (Atenolol) .Marland Kitchen... Take 1 tablet by mouth once daily  BP today: 166/80 Prior BP: 144/83 (06/13/2009)  Labs Reviewed: K+: 4.2 (06/13/2009) Creat: : 1.33 (06/13/2009)   Chol: 155 (06/13/2009)   HDL: 58 (06/13/2009)   LDL: 75 (06/13/2009)   TG: 108 (06/13/2009)  Problem # 4:  HYPERLIPIDEMIA (ICD-272.4) at goal  Her updated medication list for this problem includes:    Lipitor 10 Mg Tabs (Atorvastatin calcium) .Marland Kitchen... Take 1 tablet by mouth once daily  Labs Reviewed: SGOT: 18 (06/13/2009)   SGPT: 13 (06/13/2009)   HDL:58 (06/13/2009), 61 (08/09/2008)  LDL:75 (06/13/2009), 89 (08/09/2008)  Chol:155 (06/13/2009), 167 (08/09/2008)  Trig:108 (06/13/2009), 87 (08/09/2008)  Problem # 5:  DIABETES MELLITUS, TYPE II (ICD-250.00)  at goal need to track GFR as pt's Cr slightly elevated but GFR likely less than normal due to age.  Her updated medication list for this problem includes:    Glucophage 1000 Mg Tabs (Metformin hcl) .Marland Kitchen... Take 1 tablet by mouth two times a day  Orders: T- Capillary Blood Glucose (57846) T-Hgb A1C (in-house) (96295MW)  Labs Reviewed: Creat: 1.33 (06/13/2009)    Reviewed HgBA1c results: 6.3 (09/20/2009)  6.3 (06/13/2009)  Complete Medication List: 1)  Lipitor 10 Mg Tabs (Atorvastatin calcium) .... Take 1 tablet by mouth once daily 2)  Glucophage 1000 Mg Tabs  (Metformin hcl) .... Take 1 tablet by mouth two times a day 3)  Maxzide-25 37.5-25 Mg Tabs (Triamterene-hctz) .... Take 1 tablet by mouth once daily 4)  Omeprazole 20 Mg Cpdr (Omeprazole) .... Take one capsue by mouth once daily 5)  Fexofenadine Hcl 180 Mg Tabs (Fexofenadine hcl) .... Take 1 tablet by mouth once daily 6)  Atenolol 50 Mg Tabs (Atenolol) .... Take 1 tablet by mouth once daily 7)  Ferrous Sulfate 324 Mg Tbec (Ferrous sulfate) .... Take 1 tablet by mouth once a day 8)  Neurontin 400 Mg Caps (Gabapentin) .... Take 1 tablet by mouth three times a day. 9)  Vicodin 5-500 Mg Tabs (Hydrocodone-acetaminophen) .... Every six hours as needed. 10)  Onetouch Ultra Test Strp (Glucose blood) .... Test 3 times daily as directed  Other Orders: Dexa scan (Dexa scan)  Prevention & Chronic Care Immunizations   Influenza vaccine: Fluvax  MCR  (06/13/2009)   Influenza vaccine deferral: Refused  (11/28/2008)    Tetanus booster: 11/28/2008: Td   Tetanus booster due: 11/26/2018    Pneumococcal vaccine: Pneumovax  (11/28/2008)   Pneumococcal vaccine due: 11/25/2013    H. zoster vaccine: Not documented   H. zoster vaccine deferral: Deferred  (11/25/2008)  Colorectal Screening   Hemoccult: Not documented    Colonoscopy: 5mm Sessile ascending colon polyp; internal hemmorhoids. Pathology report: Adenomatosis Exam by Dr. Rachael Fee   (12/06/2004)   Colonoscopy due: 11/2009  Other Screening   Pap smear: Not documented   Pap smear action/deferral: Not indicated S/P hysterectomy  (09/20/2009)    Mammogram: BI-RADS CATEGORY 1:  Negative.^MM DIGITAL DIAG LTD R  (01/13/2009)   Mammogram due: 11/2008    DXA bone density scan: Not documented   DXA bone density action/deferral: Ordered  (09/20/2009)  Reports requested:  Smoking status: never  (09/20/2009)  Diabetes Mellitus   HgbA1C: 6.3  (09/20/2009)   HgbA1C action/deferral: Ordered  (06/13/2009)   Hemoglobin A1C due: 06/03/2007     Eye exam: Not documented   Last eye exam report requested.    Foot exam: yes  (08/09/2008)   Foot exam action/deferral: Do today   High risk foot: No  (08/09/2008)   Foot care education: Done  (09/20/2009)    Urine microalbumin/creatinine ratio: 6.1  (08/09/2008)   Urine microalbumin action/deferral: Not indicated   Urine microalbumin/cr due: 03/02/2008    Diabetes flowsheet reviewed?: Yes   Progress toward A1C goal: At goal  Lipids   Total Cholesterol: 155  (06/13/2009)   Lipid panel action/deferral: Lipid Panel ordered   LDL: 75  (06/13/2009)   LDL Direct: Not documented   HDL: 58  (06/13/2009)   Triglycerides: 108  (06/13/2009)   Lipid panel due: 03/02/2008    SGOT (AST): 18  (06/13/2009)   BMP action: Ordered   SGPT (ALT): 13  (06/13/2009)   Alkaline phosphatase: 88  (06/13/2009)   Total bilirubin: 0.4  (06/13/2009)    Lipid flowsheet reviewed?: Yes   Progress toward LDL goal: At goal  Hypertension   Last Blood Pressure: 166 / 80  (09/20/2009)   Serum creatinine: 1.33  (06/13/2009)   BMP action: Ordered   Serum potassium 4.2  (06/13/2009)    Hypertension flowsheet reviewed?: Yes   Progress toward BP goal: Deteriorated  Self-Management Support :   Personal Goals (by the next clinic visit) :     Personal A1C goal: 6  (06/13/2009)     Personal blood pressure goal: 130/80  (06/13/2009)     Personal LDL goal: 100  (06/13/2009)    Patient will work on the following items until the next clinic visit to reach self-care goals:     Medications and monitoring: bring all of my medications to every visit, examine my feet every day  (09/20/2009)     Eating: drink diet soda or water instead of juice or soda, eat more vegetables, use fresh or frozen vegetables, eat foods that are low in salt, eat baked foods instead of fried foods, eat fruit for snacks and desserts  (09/20/2009)     Activity: take a 30 minute walk every day  (06/13/2009)    Diabetes self-management support:  Resources for patients handout, Written self-care plan  (09/20/2009)   Diabetes care plan printed   Last diabetes self-management training by diabetes educator: 03/06/2007    Hypertension self-management support: Resources for patients handout, Written self-care plan  (09/20/2009)   Hypertension self-care  plan printed.    Lipid self-management support: Resources for patients handout, Written self-care plan  (09/20/2009)   Lipid self-care plan printed.      Resource handout printed.   Nursing Instructions: Diabetic foot exam today Schedule screening DXA bone density scan (see order) Request report of last diabetic eye exam   Process Orders Check Orders Results:     [facility]: Check successful Tests Sent for requisitioning (Sep 20, 2009 10:16 AM):     09/20/2009: [facility] -- T-Vitamin B12 [[id number]] (signed)     09/20/2009: [facility] -- T-Magnesium [[id number]] (signed)     09/20/2009: [facility] -- T-Phosphorus [[id number]] (signed)    Laboratory Results   Blood Tests   Date/Time Received: Sep 20, 2009 9:27 AM  Date/Time Reported: Burke Keels  Sep 20, 2009 9:27 AM   HGBA1C: 6.3%   (Normal Range: Non-Diabetic - 3-6%   Control Diabetic - 6-8%) CBG Fasting:: 105mg /dL     Appended Document: (VEGA) ARM/SB.    Clinical Lists Changes  Allergies: Changed allergy or adverse reaction from LISINOPRIL (LISINOPRIL) to LISINOPRIL (LISINOPRIL) Observations: Added new observation of INSTRUCTIONS: Please schedule an appointment with your primary doctor in next available. Will check some basic blood tests as discussed and call in any problem. Will check bone density scan to look for osteoporosis. I do not think this is anything serious as we discussed but if you have any new symptoms or worsening symptoms, call clinic. (09/20/2009 10:17)       Patient Instructions: 1)  Please schedule an  appointment with your primary doctor in next available. 2)  Will check some basic blood tests as discussed and call in any problem. 3)  Will check bone density scan to look for osteoporosis. 4)  I do not think this is anything serious as we discussed but if you have any new symptoms or worsening symptoms, call clinic.

## 2010-05-29 NOTE — Progress Notes (Signed)
Summary: med refill/gp  Phone Note Refill Request Message from:  Fax from Pharmacy on Sep 15, 2009 2:14 PM  Refills Requested: Medication #1:  LIPITOR 10 MG TABS Take 1 tablet by mouth once daily   Last Refilled: 08/13/2009  Medication #2:  MAXZIDE-25 37.5-25 MG TABS Take 1 tablet by mouth once daily   Last Refilled: 08/13/2009  Medication #3:  OMEPRAZOLE 20 MG CPDR Take one capsue by mouth once daily   Last Refilled: 08/13/2009  Medication #4:  ATENOLOL 50 MG TABS Take 1 tablet by mouth once daily   Last Refilled: 08/13/2009                      #5 [facility] Iron 65mg  tablet - take 1 tablet by mouth once a day. Qty. 30 Last refilled 08/13/09.   Method Requested: Electronic Initial call taken by: Chinita Pester RN,  Sep 15, 2009 2:14 PM    Prescriptions already refilled.

## 2010-05-29 NOTE — Progress Notes (Signed)
Summary: Page 2 med refill/gp  Phone Note Refill Request Message from:  Fax from Pharmacy on March 13, 2010 11:23 AM  Refills Requested: Medication #1:  ATENOLOL 50 MG TABS Take 1 tablet by mouth once daily   Last Refilled: 02/13/2010  Medication #2:  FERROUS SULFATE 324 MG  TBEC Take 1 tablet by mouth once a day   Last Refilled: 02/13/2010  Method Requested: Electronic Initial call taken by: Chinita Pester RN,  March 13, 2010 11:23 AM

## 2010-05-29 NOTE — Letter (Signed)
[facility]   Imported By: Margie Billet 03/26/2010 11:00:07  _____________________________________________________________________  External Attachment:    Type:   Image     Comment:   External Document

## 2010-05-29 NOTE — Progress Notes (Signed)
**Note De-Identified  Obfuscation** Summary: refill/ hla  Phone Note Refill Request Message from:  Fax from Pharmacy on March 12, 2010 1:53 PM  Refills Requested: Medication #1:  VICODIN 5-500 MG TABS Every six hours as needed.   Dosage confirmed as above?Dosage Confirmed   Last Refilled: 9/6 last visit 6/23  Initial call taken by: Marin Roberts RN,  March 12, 2010 1:53 PM  Follow-up for Phone Call        No narc contract but was able to determine that narc is 2/2 chronic L knee pain. Was last seen 6/11 and no RTC was sch. Sent flag to Ms Lissa Hoard to sch appt. Will refill.\par  Needs contract and assessment of pain. Follow-up by: Blanch Media MD,  March 12, 2010 3:10 PM  Additional Follow-up for Phone Call Additional follow up Details #1::        Rx faxed to pharmacy Additional Follow-up by: Angelina Ok RN,  March 19, 2010 9:45 AM    Prescriptions: VICODIN 5-500 MG TABS (HYDROCODONE-ACETAMINOPHEN) Every six hours as needed.  #90 x 0   Entered and Authorized by:   Blanch Media MD   Signed by:   Blanch Media MD on 03/12/2010   Method used:   Telephoned to ...       [facility] [place] [phone]* (retail)       [phone] [address]       [place], [place]  [address]       Ph: [phone] or [phone]       Fax: [phone]   RxID:   [id number]

## 2010-05-29 NOTE — Procedures (Signed)
Summary: Colonoscopy  Patient: Lennyn Gange Note: All result statuses are Final unless otherwise noted.  Tests: (1) Colonoscopy (COL)   COL Colonoscopy           DONE     [facility]     [address]     [place], [place]  [address]           COLONOSCOPY PROCEDURE REPORT           PATIENT:  Wanda Mclaughlin, Wanda Mclaughlin  MR#:  [id number]     BIRTHDATE:  Jan 15, 1929, 80 yrs. old  GENDER:  female     ENDOSCOPIST:  Rachael Fee, MD     PROCEDURE DATE:  11/29/2009     PROCEDURE:  Diagnostic Colonoscopy     ASA CLASS:  Class II     INDICATIONS:  history of pre-cancerous (adenomatous) colon polyps           MEDICATIONS:   Fentanyl 25 mcg IV, Versed 4 mg IV           DESCRIPTION OF PROCEDURE:   After the risks benefits and     alternatives of the procedure were thoroughly explained, informed     consent was obtained.  Digital rectal exam was performed and     revealed no rectal masses.   The LB [id number]-[id number] endoscope     was introduced through the anus and advanced to the cecum, which     was identified by both the appendix and ileocecal valve, without     limitations.  The quality of the prep was adequate, using     MoviPrep.  The instrument was then slowly withdrawn as the colon     was fully examined.     <<PROCEDUREIMAGES>>           FINDINGS:  A normal appearing cecum, ileocecal valve, and     appendiceal orifice were identified. The ascending, hepatic     flexure, transverse, splenic flexure, descending, sigmoid colon,     and rectum appeared unremarkable (see image2, image3, and image4).     Retroflexed views in the rectum revealed no abnormalities.    The     scope was then withdrawn from the patient and the procedure     completed.           COMPLICATIONS:  None           ENDOSCOPIC IMPRESSION:     1) Normal colon     2) No polyps or cancers           RECOMMENDATIONS:     1) Given your age, you will not need another colonoscopy for     colon cancer screening or  polyp surveillance. These types of tests     usually stop around the age 79.           ______________________________     Rachael Fee, MD           n.     eSIGNED:   Rachael Fee at 11/29/2009 09:50 AM           Winfred Burn, [id number]  Note: An exclamation mark (!) indicates a result that was not dispersed into the flowsheet. Document Creation Date: 11/29/2009 9:51 AM _______________________________________________________________________  (1) Order result status: Final Collection or observation date-time: 11/29/2009 09:48 Requested date-time:  Receipt date-time:  Reported date-time:  Referring Physician:   Ordering Physician: Rob Bunting [id number]) Specimen Source:  Source: Launa Grill Order  Number: [id number] Lab site:

## 2010-05-29 NOTE — Progress Notes (Signed)
Summary: Page 1  med refills/gp  Phone Note Refill Request Message from:  Fax from Pharmacy on March 13, 2010 11:20 AM  Refills Requested: Medication #1:  LIPITOR 10 MG TABS Take 1 tablet by mouth once daily   Last Refilled: 02/13/2010  Medication #2:  GLUCOPHAGE 1000 MG  TABS Take 1 tablet by mouth two times a day   Last Refilled: 02/13/2010  Medication #3:  MAXZIDE-25 37.5-25 MG TABS Take 1 tablet by mouth once daily   Last Refilled: 02/13/2010  Medication #4:  OMEPRAZOLE 20 MG CPDR Take one capsue by mouth once daily   Last Refilled: 02/13/2010 Last appt. 10/19/09; labs 10/26/09.   Method Requested: Electronic Initial call taken by: Chinita Pester RN,  March 13, 2010 11:22 AM  Follow-up for Phone Call        Gave three month supply. Have already sent flag Ms Lissa Hoard to sch appt. Follow-up by: Blanch Media MD,  March 13, 2010 2:02 PM    Prescriptions: FERROUS SULFATE 324 MG  TBEC (FERROUS SULFATE) Take 1 tablet by mouth once a day  #30 x 2   Entered and Authorized by:   Blanch Media MD   Signed by:   Blanch Media MD on 03/13/2010   Method used:   [facility] to        [facility] [phone]* (retail)       [address] [place]  [address]       Ph: [phone] or [phone]       Fax: [phone]   RxID:   [id number] ATENOLOL 50 MG TABS (ATENOLOL) Take 1 tablet by mouth once daily  #30 x 2   Entered and Authorized by:   Blanch Media MD   Signed by:   Blanch Media MD on 03/13/2010   Method used:   Electronically to        [facility]. [phone]* (retail)       [address] [place]  [address]       Ph: [phone] or [phone]       Fax: [phone]   RxID:   [id number] OMEPRAZOLE 20 MG CPDR (OMEPRAZOLE) Take one capsue by mouth once daily  #30 x 2   Entered and Authorized by:   Blanch Media MD   Signed by:   Blanch Media MD on 03/13/2010   Method used:   Electronically to   [facility] [phone]* (retail)       [address] [place]  [address]       Ph: [phone] or [phone]       Fax: [phone]   RxID:   [id number] VELFYBO-17 37.5-25 MG TABS (TRIAMTERENE-HCTZ) Take 1 tablet by mouth once daily  #30 x 2   Entered and Authorized by:   Blanch Media MD   Signed by:   Blanch Media MD on 03/13/2010   Method used:   Electronically to        [facility]. [phone]* (retail)       [address]       [place], [place]  [address]       Ph: [phone] or [phone]       Fax: [phone]   RxID:   [id number] GLUCOPHAGE 1000 MG  TABS (METFORMIN HCL) Take 1 tablet by mouth two times  a day  #60 x 2   Entered and Authorized by:   Blanch Media MD   Signed by:   Blanch Media MD on 03/13/2010   Method used:   Electronically to        [facility]. [phone]* (retail)       [address], [place]  [address]       Ph: [phone] or [phone]       Fax: [phone]   RxID:   [id number] LIPITOR 10 MG TABS (ATORVASTATIN CALCIUM) Take 1 tablet by mouth once daily  #30 x 2   Entered and Authorized by:   Blanch Media MD   Signed by:   Blanch Media MD on 03/13/2010   Method used:   Electronically to        [facility]. [phone]* (retail)       [address]       [place], [place]  [address]       Ph: [phone] or [phone]       Fax: [phone]   RxID:   [id number]

## 2010-05-29 NOTE — Progress Notes (Signed)
Summary: refill/gg  Phone Note Refill Request  on Sep 14, 2009 4:41 PM  Refills Requested: Medication #1:  FERROUS SULFATE 324 MG  TBEC Take 1 tablet by mouth once a day   Last Refilled: 08/13/2009  Medication #2:  OMEPRAZOLE 20 MG CPDR Take one capsue by mouth once daily   Last Refilled: 08/13/2009  Medication #3:  ATENOLOL 50 MG TABS Take 1 tablet by mouth once daily   Last Refilled: 08/13/2009  Medication #4:  LIPITOR 10 MG TABS Take 1 tablet by mouth once daily   Last Refilled: 08/13/2009  Method Requested: Electronic Initial call taken by: Merrie Roof RN,  Sep 14, 2009 4:41 PM    Prescriptions: ATENOLOL 50 MG TABS (ATENOLOL) Take 1 tablet by mouth once daily  #30 x 5   Entered and Authorized by:   Laren Everts MD   Signed by:   Laren Everts MD on 09/16/2009   Method used:   [facility]        [facility] [phone]* (retail)       [address] [place]  [address]       Ph: [phone] or [phone]       Fax: [phone]   RxID:   [id number] LIPITOR 10 MG TABS (ATORVASTATIN CALCIUM) Take 1 tablet by mouth once daily  #30 x 5   Entered and Authorized by:   Laren Everts MD   Signed by:   Laren Everts MD on 09/16/2009   Method used:   [facility] to        [facility] [phone]* (retail)       [address], [place]  [address]       Ph: [phone] or [phone]       Fax: [phone]   RxID:   [id number] OMEPRAZOLE 20 MG CPDR (OMEPRAZOLE) Take one capsue by mouth once daily  #30 x 5   Entered and Authorized by:   Laren Everts MD   Signed by:   Laren Everts MD on 09/16/2009   Method used:   [facility] to        [facility] [phone]* (retail)       [address] [place]  [address]       Ph: [phone] or [phone]       Fax: [phone]   RxID:   [id number] FERROUS SULFATE 324 MG  TBEC (FERROUS SULFATE) Take 1 tablet by  mouth once a day  #30 x 5   Entered and Authorized by:   Laren Everts MD   Signed by:   Laren Everts MD on 09/16/2009   Method used:   [facility] to        [facility] [phone]* (retail)       [address]       [place], [place]  [address]       Ph: [phone] or [phone]       Fax: [phone]   RxID:   [id number]

## 2010-05-29 NOTE — Progress Notes (Signed)
Summary: Bone density results and rxs//kg  Phone Note Outgoing Call   Call placed by: Cynda Familia Duncan Dull),  September 29, 2009 11:03 AM Call placed to: Patient Summary of Call: Message left on pt's recorder to contact the clinic regarding new prescriptions written by Dr Onalee Hua.Cynda Familia [facility])  September 29, 2009 11:04 AM Message left on pt's recorder to contact the clinic.Cynda Familia [facility])  October 03, 2009 11:42 AM  Message left on pt's recorder and letter mailed.Cynda Familia [facility])  October 06, 2009 4:36 PM

## 2010-05-29 NOTE — Assessment & Plan Note (Signed)
Summary: EST-CK-FU/MEDS/CFB   Vital Signs:  Patient profile:   77 year old female Height:      63 inches (160.02 cm) Weight:      180.0 pounds (81.82 kg) BMI:     32.00 Temp:     98.3 degrees F (36.83 degrees C) oral Pulse rate:   68 / minute BP sitting:   144 / 83  (right arm)  Vitals Entered By: Blenda Mounts (June 13, 2009 11:01 AM) CC: check up, both feet and hand numbness Is Patient Diabetic? Yes Did you bring your meter with you today? Yes Pain Assessment Patient in pain? no      Nutritional Status BMI of > 30 = obese  Have you ever been in a relationship where you felt threatened, hurt or afraid?No   Does patient need assistance? Functional Status Self care Ambulation Normal   Primary Care Provider:  Laren Everts MD  CC:  check up and both feet and hand numbness.  History of Present Illness: 77 yr old woman with pmhx as described below comes to the clinic for regular check up. Patient reports to have pain in her feet in the plantar aspect. Experienced especially first thing in the morning. Feels sharp. Denies focal weakness.  Patient reports that she continues to have knee pain which is chronic and has not changed in intensity.   Preventive Screening-Counseling & Management  Alcohol-Tobacco     Alcohol drinks/day: 0     Smoking Status: never  Caffeine-Diet-Exercise     Does Patient Exercise: yes     Type of exercise: ROM  / WALKING     Exercise (avg: min/session): VARIES     Times/week: 5-7  Problems Prior to Update: 1)  Preventive Health Care  (ICD-V70.0) 2)  Otitis Externa, Acute, Right  (ICD-380.12) 3)  Ankle Edema  (ICD-782.3) 4)  Renal Disease, Chronic, Mild  (ICD-585.2) 5)  Systolic Murmur  (ICD-785.2) 6)  Knee Pain, Left, Chronic  (ICD-719.46) 7)  Thigh Pain  (ICD-729.5) 8)  Paresthesia  (ICD-782.0) 9)  Encounter For Therapeutic Drug Monitoring  (ICD-V58.83) 10)  Aftercare, Long-term Use, Medications Nec  (ICD-V58.69) 11)   Anemia Nos  (ICD-285.9) 12)  Degenerative Joint Disease  (ICD-715.90) 13)  Weight Loss, Abnormal  (ICD-783.21) 14)  Duodenitis  (ICD-535.60) 15)  Gastritis  (ICD-535.50) 16)  Vertigo  (ICD-780.4) 17)  Hypertension  (ICD-401.9) 18)  Hyperlipidemia  (ICD-272.4) 19)  Diabetes Mellitus, Type II  (ICD-250.00)  Medications Prior to Update: 1)  Lipitor 10 Mg Tabs (Atorvastatin Calcium) .... Take 1 Tablet By Mouth Once Daily 2)  Glucophage 1000 Mg  Tabs (Metformin Hcl) .... Take 1 Tablet By Mouth Two Times A Day 3)  Maxzide-25 37.5-25 Mg Tabs (Triamterene-Hctz) .... Take 1 Tablet By Mouth Once Daily 4)  Omeprazole 20 Mg Cpdr (Omeprazole) .... Take One Capsue By Mouth Once Daily 5)  Fexofenadine Hcl 180 Mg Tabs (Fexofenadine Hcl) .... Take 1 Tablet By Mouth Once Daily 6)  Atenolol 50 Mg Tabs (Atenolol) .... Take 1 Tablet By Mouth Once Daily 7)  Ferrous Sulfate 324 Mg  Tbec (Ferrous Sulfate) .... Take 1 Tablet By Mouth Once A Day 8)  Neurontin 400 Mg Caps (Gabapentin) .... Take 1 Tablet By Mouth Three Times A Day. 9)  Vicodin 5-500 Mg Tabs (Hydrocodone-Acetaminophen) .... Every Six Hours As Needed. 10)  Neomycin-Polymyxin-Hc 3.5-10000-1 Soln (Neomycin-Polymyxin-Hc) .... Apply 1 Drop To Left Ear Three Times A Day For 1 Week.  Current Medications (verified): 1)  Lipitor 10  Mg Tabs (Atorvastatin Calcium) .... Take 1 Tablet By Mouth Once Daily 2)  Glucophage 1000 Mg  Tabs (Metformin Hcl) .... Take 1 Tablet By Mouth Two Times A Day 3)  Maxzide-25 37.5-25 Mg Tabs (Triamterene-Hctz) .... Take 1 Tablet By Mouth Once Daily 4)  Omeprazole 20 Mg Cpdr (Omeprazole) .... Take One Capsue By Mouth Once Daily 5)  Fexofenadine Hcl 180 Mg Tabs (Fexofenadine Hcl) .... Take 1 Tablet By Mouth Once Daily 6)  Atenolol 50 Mg Tabs (Atenolol) .... Take 1 Tablet By Mouth Once Daily 7)  Ferrous Sulfate 324 Mg  Tbec (Ferrous Sulfate) .... Take 1 Tablet By Mouth Once A Day 8)  Neurontin 400 Mg Caps (Gabapentin) .... Take 1  Tablet By Mouth Three Times A Day. 9)  Vicodin 5-500 Mg Tabs (Hydrocodone-Acetaminophen) .... Every Six Hours As Needed.  Allergies: 1)  ! Lisinopril (Lisinopril)  Past History:  Past Medical History: Last updated: 03/03/2007 Diabetes mellitus, type II - dx 11/05 Hyperlipidemia Hypertension Anemia      Normal EGG, colonoscopy, and Sm. 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 10th grade level education  Risk Factors: Alcohol Use: 0 (06/13/2009) Exercise: yes (06/13/2009)  Risk Factors: Smoking Status: never (06/13/2009)  Family History: Reviewed history from 11/25/2008 and no changes required. 2 Sister in [place] 2 Brothers in [place] 1 sister is [place] --DM  Social History: Reviewed history from 11/25/2008 and no changes required. 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  The patient denies fever, chest pain, dyspnea on exertion, peripheral edema, headaches, hemoptysis, abdominal pain, melena, hematochezia, hematuria, and muscle weakness.    Physical Exam  General:  alert and cooperative to examination.   Mouth:  pharynx pink and moist.  No lesions; no petechiae. Uvula midline. Neck:  supple and no masses.   Lungs:  normal respiratory effort and no accessory muscle use.   Heart:  normal rate and regular rhythm.  Systolic murmur at Right upper sternal border --consistent with AS; no palsus tardus; no JVD bilateraly  Abdomen:  soft and normal bowel sounds.   Msk:   crepitation noted left knee, ttp medial aspect, no significant swelling. no joint swelling, no joint warmth, no redness over joints, no joint deformities, and no joint instability.   Pulses:  2+ dp pulses Extremities:  ttp plantar aspect of feet bilaterally Neurologic:  Nonfocal Psych:  normally interactive.     Impression & Recommendations:  Problem # 1:  FOOT PAIN, BILATERAL (ICD-729.5) Symptoms suggestive of plantar fasciitis. Educated on exercises patient can do in the morning to alleviate some pain. Instructed to always wear comfortable shoes.  Problem # 2:  KNEE PAIN, LEFT, CHRONIC (ICD-719.46) Stable. Continue current regimen.  Her updated medication list for this problem includes:    Vicodin 5-500 Mg Tabs (Hydrocodone-acetaminophen) ..... Every six hours as needed.  Problem # 3:  HYPERTENSION (ICD-401.9) Borderline elevated. Instructed to work on diet, limit salt intake. Recheck on follow up.  Her updated medication list for this  problem includes:    Maxzide-25 37.5-25 Mg Tabs (Triamterene-hctz) .Marland Kitchen... Take 1 tablet by mouth once daily    Atenolol 50 Mg Tabs (Atenolol) .Marland Kitchen... Take 1 tablet by mouth once daily  BP today: 144/83 Prior BP: 143/78 (11/28/2008)  Labs Reviewed: K+: 3.8 (10/17/2008) Creat: : 1.21 (10/17/2008)   Chol: 167 (08/09/2008)   HDL: 61 (08/09/2008)   LDL: 89 (08/09/2008)   TG: 87 (08/09/2008)  Problem # 4:  RENAL DISEASE, CHRONIC, MILD (ICD-585.2) Review labs and reasses.   Problem # 5:  HYPERLIPIDEMIA (ICD-272.4) Recheck FLP, cmet and reasses.   Her updated medication list for this problem includes:    Lipitor 10 Mg Tabs (Atorvastatin calcium) .Marland Kitchen... Take 1 tablet by mouth once daily  Orders: T-Lipid Profile [phone] [id number])  Labs Reviewed: SGOT: 16 (11/30/2007)   SGPT: 15 (11/30/2007)   HDL:61 (08/09/2008), 53 (03/03/2007)  LDL:89 (08/09/2008), 81 (03/03/2007)  Chol:167 (08/09/2008), 151 (03/03/2007)  Trig:87 (08/09/2008), 85  (03/03/2007)  Problem # 6:  DIABETES MELLITUS, TYPE II (ICD-250.00) At goal. Continue current regimen. Hands and feet pain may be due to diabetic neuropathy. Will consider increasing neurontin on follow up.  Her updated medication list for this problem includes:    Glucophage 1000 Mg Tabs (Metformin hcl) .Marland Kitchen... Take 1 tablet by mouth two times a day  Orders: T-Hgb A1C (in-house) ([id number]) T-Urinalysis Dipstick only ([id number])  Labs Reviewed: Creat: 1.21 (10/17/2008)    Reviewed HgBA1c results: 6.3 (06/13/2009)  5.9 (11/28/2008)  Problem # 7:  ANEMIA NOS (ICD-285.9) check labs and reasses.  Her updated medication list for this problem includes:    Ferrous Sulfate 324 Mg Tbec (Ferrous sulfate) .Marland Kitchen... Take 1 tablet by mouth once a day  Hgb: 10.9 (03/03/2007)   Hct: 33.3 (03/03/2007)   Platelets: 233 (03/03/2007) RBC: 3.41 (03/03/2007)   RDW: 13.7 (03/03/2007)   WBC: 5.4 (03/03/2007) MCV: 97.7 (03/03/2007)   MCHC: 32.7 (03/03/2007) Ferritin: 489 (03/03/2007) TSH: 3.176 (03/07/2006)  Problem # 8:  Preventive Health Care (ICD-V70.0) Flu shot given today. Will screen for thyroid disorder.  Orders: Influenza Vaccine MCR ([id number])  Complete Medication List: 1)  Lipitor 10 Mg Tabs (Atorvastatin calcium) .... Take 1 tablet by mouth once daily 2)  Glucophage 1000 Mg Tabs (Metformin hcl) .... Take 1 tablet by mouth two times a day 3)  Maxzide-25 37.5-25 Mg Tabs (Triamterene-hctz) .... Take 1 tablet by mouth once daily 4)  Omeprazole 20 Mg Cpdr (Omeprazole) .... Take one capsue by mouth once daily 5)  Fexofenadine Hcl 180 Mg Tabs (Fexofenadine hcl) .... Take 1 tablet by mouth once daily 6)  Atenolol 50 Mg Tabs (Atenolol) .... Take 1 tablet by mouth once daily 7)  Ferrous Sulfate 324 Mg Tbec (Ferrous sulfate) .... Take 1 tablet by mouth once a day 8)  Neurontin 400 Mg Caps (Gabapentin) .... Take 1 tablet by mouth three times a day. 9)  Vicodin 5-500 Mg Tabs (Hydrocodone-acetaminophen) ....  Every six hours as needed. 10)  Onetouch Ultra Test Strp (Glucose blood) .... Test 3 times daily as directed  Other Orders: T-Comprehensive Metabolic Panel ([id number]) T-CBC No Diff ([id number]) T-TSH ([id number])  Patient Instructions: 1)  Please schedule a follow-up appointment in 3 months. 2)  Do stretching exercises as discussed in clinic for foot pain. 3)  Continue to take all medication as prescribed.  4)  You will be called with any abnormalities in the tests scheduled or performed today.  If you don't hear from [place] within a week from when the test was performed,  you can assume that your test was normal.  Prescriptions: VICODIN 5-500 MG TABS (HYDROCODONE-ACETAMINOPHEN) Every six hours as needed.  #90 x 0   Entered and Authorized by:   Laren Everts MD   Signed by:   Laren Everts MD on 06/13/2009   Method used:   Print then Give to Patient   RxID:   [id number] ONETOUCH ULTRA TEST  STRP (GLUCOSE BLOOD) test 3 times daily as directed  #90 x 11   Entered and Authorized by:   Laren Everts MD   Signed by:   Laren Everts MD on 06/13/2009   Method used:   Print then Give to Patient   RxID:   [id number]   Prevention & Chronic Care Immunizations   Influenza vaccine: Fluvax MCR  (06/13/2009)   Influenza vaccine deferral: Refused  (11/28/2008)    Tetanus booster: 11/28/2008: Td   Tetanus booster due: 11/26/2018    Pneumococcal vaccine: Pneumovax  (11/28/2008)   Pneumococcal vaccine due: 11/25/2013    H. zoster vaccine: Not documented   H. zoster vaccine deferral: Deferred  (11/25/2008)  Colorectal Screening   Hemoccult: Not documented    Colonoscopy: 5mm Sessile ascending colon polyp; internal hemmorhoids. Pathology report: Adenomatosis Exam by Dr. Rachael Fee   (12/06/2004)   Colonoscopy due: 11/2009  Other Screening   Pap smear: Not documented    Mammogram: BI-RADS CATEGORY 1:  Negative.^MM DIGITAL DIAG LTD R   (01/13/2009)   Mammogram due: 11/2008    DXA bone density scan: Not documented   Smoking status: never  (06/13/2009)  Diabetes Mellitus   HgbA1C: 6.3  (06/13/2009)   HgbA1C action/deferral: Ordered  (06/13/2009)   Hemoglobin A1C due: 06/03/2007    Eye exam: Not documented    Foot exam: yes  (08/09/2008)   High risk foot: No  (08/09/2008)   Foot care education: Not documented    Urine microalbumin/creatinine ratio: 6.1  (08/09/2008)   Urine microalbumin/cr due: 03/02/2008    Diabetes flowsheet reviewed?: Yes   Progress toward A1C goal: At goal  Lipids   Total Cholesterol: 167  (08/09/2008)   Lipid panel action/deferral: Lipid Panel ordered   LDL: 89  (08/09/2008)   LDL Direct: Not documented   HDL: 61  (08/09/2008)   Triglycerides: 87  (08/09/2008)   Lipid panel due: 03/02/2008    SGOT (AST): 16  (11/30/2007)   BMP action: Ordered   SGPT (ALT): 15  (11/30/2007) CMP ordered    Alkaline phosphatase: 83  (11/30/2007)   Total bilirubin: 0.4  (11/30/2007)    Lipid flowsheet reviewed?: Yes   Progress toward LDL goal: At goal  Hypertension   Last Blood Pressure: 144 / 83  (06/13/2009)   Serum creatinine: 1.21  (10/17/2008)   BMP action: Ordered   Serum potassium 3.8  (10/17/2008) CMP ordered     Hypertension flowsheet reviewed?: Yes   Progress toward BP goal: Deteriorated  Self-Management Support :   Personal Goals (by the next clinic visit) :     Personal A1C goal: 6  (06/13/2009)     Personal blood pressure goal: 130/80  (06/13/2009)     Personal LDL goal: 100  (06/13/2009)    Patient will work on the following items until the next clinic visit to reach self-care goals:     Medications and monitoring: take my medicines every day, check my blood sugar, bring all of my medications to every visit, examine my feet every day  (06/13/2009)     Eating: drink diet soda  or water instead of juice or soda, eat more vegetables, eat foods that are low in salt, eat baked  foods instead of fried foods, eat fruit for snacks and desserts  (06/13/2009)     Activity: take a 30 minute walk every day  (06/13/2009)    Diabetes self-management support: Copy of home glucose meter record, Written self-care plan  (06/13/2009)   Diabetes care plan printed   Last diabetes self-management training by diabetes educator: 03/06/2007    Hypertension self-management support: Written self-care plan  (06/13/2009)   Hypertension self-care plan printed.    Lipid self-management support: Written self-care plan  (06/13/2009)   Lipid self-care plan printed.   Nursing Instructions: HgbA1C today (see order) Give Flu vaccine today   Process Orders Check Orders Results:     [facility]: Check successful Tests Sent for requisitioning (June 13, 2009 11:16 PM):     06/13/2009: [facility] -- T-Lipid Profile [id number] (signed)     06/13/2009: [facility] -- T-Comprehensive Metabolic Panel [[id number]] (signed)     06/13/2009: [facility] -- T-CBC No Diff [[id number]] (signed)     06/13/2009: [facility] -- T-TSH [phone] (signed)    Laboratory Results   Urine Tests  Date/Time Received: 06/13/2009 11:00am Date/Time Reported: 06/13/2009 12:32pm Shapale Watlington  June 13, 2009 1:41 PM   Routine Urinalysis   Color: yellow Appearance: Hazy Glucose: negative   (Normal Range: Negative) Bilirubin: negative   (Normal Range: Negative) Ketone: negative   (Normal Range: Negative) Spec. Gravity: 1.005   (Normal Range: 1.003-1.035) Blood: trace-intact   (Normal Range: Negative) pH: 5.0   (Normal Range: 5.0-8.0) Protein: negative   (Normal Range: Negative) Urobilinogen: 0.2   (Normal Range: 0-1) Nitrite: negative   (Normal Range: Negative) Leukocyte Esterace: negative   (Normal Range: Negative)     Blood Tests   Date/Time Received: June 13, 2009 12:44 PM  Date/Time Reported:  Burke Keels  June 13, 2009 12:44 PM   HGBA1C: 6.3%   (Normal Range: Non-Diabetic - 3-6%   Control Diabetic - 6-8%)       Immunizations Administered:  Influenza Vaccine # 1:    Vaccine Type: Fluvax MCR    Site: left deltoid    Mfr: novartis    Dose: 0.5 ml    Route: IM    Given by: Angelina Ok RN    Exp. Date: 07/2009    Lot #: [id number]  Flu Vaccine Consent Questions:    Do you have a history of severe allergic reactions to this vaccine? no    Any prior history of allergic reactions to egg and/or gelatin? no    Do you have a sensitivity to the preservative Thimersol? no    Do you have a past history of Guillan-Barre Syndrome? no    Do you currently have an acute febrile illness? no    Have you ever had a severe reaction to latex? no    Vaccine information given and explained to patient? yes    Are you currently pregnant? no

## 2010-05-29 NOTE — Progress Notes (Signed)
Summary: Refill/gh  Phone Note Refill Request Message from:  Fax from Pharmacy on October 25, 2009 3:28 PM  Refills Requested: Medication #1:  FEXOFENADINE HCL 180 MG TABS Take 1 tablet by mouth once daily   Last Refilled: 09/22/2009  Method Requested: Electronic Initial call taken by: Angelina Ok RN,  October 25, 2009 3:32 PM    Prescriptions: FEXOFENADINE HCL 180 MG TABS (FEXOFENADINE HCL) Take 1 tablet by mouth once daily  #30 Tablet x 4   Entered and Authorized by:   Laren Everts MD   Signed by:   Laren Everts MD on 10/27/2009   Method used:   [facility]        [facility] [phone]* (retail)       [address]       [place], [place]  [address]       Ph: [phone] or [phone]       Fax: [phone]   RxID:   [id number]

## 2010-05-29 NOTE — Progress Notes (Signed)
  Phone Note Outgoing Call   Call placed by: Theotis Barrio NT II,  November 08, 2009 3:03 PM Call placed to: Patient Details for Reason: WHERE DID SHE HAVE HER EYE EXAM DONE. Summary of Call: CALLED PTIENT AND LEFT VOICE MESSAGE FOR PATIENT TO CALL AND LET ME KNOW WHERE SHE HAD HER EYE EXAM  /  HER APPT WAS ON JULY 5, 011.

## 2010-05-29 NOTE — Progress Notes (Signed)
Summary: med refill/gp  Phone Note Refill Request Message from:  Fax from Pharmacy on August 14, 2009 4:13 PM  Refills Requested: Medication #1:  VICODIN 5-500 MG TABS Every six hours as needed.   Last Refilled: 06/17/2009  Method Requested: Telephone to Pharmacy Initial call taken by: Chinita Pester RN,  August 14, 2009 4:13 PM    Prescriptions: VICODIN 5-500 MG TABS (HYDROCODONE-ACETAMINOPHEN) Every six hours as needed.  #90 x 0   Entered and Authorized by:   Laren Everts MD   Signed by:   Laren Everts MD on 08/15/2009   Method used:   [facility]phoned to ...       [facility] [place] [phone]* (retail)       [phone] [address]       [place], [place]  [address]       Ph: [phone] or [phone]       Fax: [phone]   RxID:   [id number]   Appended Document: med refill/gp [facility]in [facility] [address]

## 2010-05-29 NOTE — Progress Notes (Signed)
Summary: refill/ hla  Phone Note Refill Request Message from:  Fax from Pharmacy on June 07, 2009 8:54 AM  Refills Requested: Medication #1:  GLUCOPHAGE 1000 MG  TABS Take 1 tablet by mouth two times a day   Dosage confirmed as above?Dosage Confirmed   Last Refilled: 1/10 Initial call taken by: Marin Roberts RN,  June 07, 2009 8:55 AM    Prescriptions: GLUCOPHAGE 1000 MG  TABS (METFORMIN HCL) Take 1 tablet by mouth two times a day  #60 x 0   Entered and Authorized by:   Zoila Shutter MD   Signed by:   Zoila Shutter MD on 06/07/2009   Method used:   [facility] to        [facility] [phone]* (retail)       [address]       [place], [place]  [address]       Ph: [phone] or [phone]       Fax: [phone]   RxID:   [id number]

## 2010-05-29 NOTE — Assessment & Plan Note (Signed)
Summary: NOT HFU/F/U VISIT PER ALV[facility]Z/[facility]   Vital Signs:  Patient profile:   77 year old female Height:      63 inches (160.02 cm) Weight:      189.2 pounds (86.00 kg) BMI:     33.64 Temp:     98.1 degrees F (36.72 degrees C) oral Pulse rate:   63 / minute BP sitting:   143 / 87  (left arm) Cuff size:   large  Vitals Entered By: Theotis Barrio NT II (October 19, 2009 1:50 PM) CC: MEDICATION REFILL /  DM /   LEFT LEG PAIN Is Patient Diabetic? Yes Did you bring your meter with you today? Yes Pain Assessment Patient in pain? yes     Location: LEFT LEG Intensity:   10 Type: DULL / ACHE Onset of pain  Chronic Nutritional Status BMI of > 30 = obese CBG Result 92  Have you ever been in a relationship where you felt threatened, hurt or afraid?No   Does patient need assistance? Functional Status Self care Ambulation Normal Comments WALKS WITH THE ASSIST OF A CANE   Primary Care Provider:  Laren Everts MD  CC:  MEDICATION REFILL /  DM /   LEFT LEG PAIN.  History of Present Illness: 77 yr old woman with pmhx as described below comes to the clinic for follow up to discuss results of Bone Scan and new medications she was started on. Reports that tingling/pain in legs are a little better since starting neurontin but would like more relief.  No new weakness, bowel or bladder incontinence, or focal deficit. Denies chest pain, palpitations, diaphoresis, shortness of breath, abdominal pain, or fever/chills.  Preventive Screening-Counseling & Management  Alcohol-Tobacco     Alcohol drinks/day: 0     Smoking Status: never  Caffeine-Diet-Exercise     Does Patient Exercise: yes     Type of exercise: ROM  / WALKING     Exercise (avg: min/session): VARIES     Times/week: 5-7  Problems Prior to Update: 1)  Osteoporosis  (ICD-733.00) 2)  Preventive Health Care  (ICD-V70.0) 3)  Otitis Externa, Acute, Right  (ICD-380.12) 4)  Ankle Edema  (ICD-782.3) 5)  Renal Disease, Chronic,  Mild  (ICD-585.2) 6)  Systolic Murmur  (ICD-785.2) 7)  Knee Pain, Left, Chronic  (ICD-719.46) 8)  Thigh Pain  (ICD-729.5) 9)  Paresthesia  (ICD-782.0) 10)  Encounter For Therapeutic Drug Monitoring  (ICD-V58.83) 11)  Aftercare, Long-term Use, Medications Nec  (ICD-V58.69) 12)  Anemia Nos  (ICD-285.9) 13)  Degenerative Joint Disease  (ICD-715.90) 14)  Weight Loss, Abnormal  (ICD-783.21) 15)  Duodenitis  (ICD-535.60) 16)  Gastritis  (ICD-535.50) 17)  Vertigo  (ICD-780.4) 18)  Hypertension  (ICD-401.9) 19)  Hyperlipidemia  (ICD-272.4) 20)  Diabetes Mellitus, Type II  (ICD-250.00)  Medications Prior to Update: 1)  Lipitor 10 Mg Tabs (Atorvastatin Calcium) .... Take 1 Tablet By Mouth Once Daily 2)  Glucophage 1000 Mg  Tabs (Metformin Hcl) .... Take 1 Tablet By Mouth Two Times A Day 3)  Maxzide-25 37.5-25 Mg Tabs (Triamterene-Hctz) .... Take 1 Tablet By Mouth Once Daily 4)  Omeprazole 20 Mg Cpdr (Omeprazole) .... Take One Capsue By Mouth Once Daily 5)  Fexofenadine Hcl 180 Mg Tabs (Fexofenadine Hcl) .... Take 1 Tablet By Mouth Once Daily 6)  Atenolol 50 Mg Tabs (Atenolol) .... Take 1 Tablet By Mouth Once Daily 7)  Ferrous Sulfate 324 Mg  Tbec (Ferrous Sulfate) .... Take 1 Tablet By Mouth Once A Day 8)  Neurontin 400 Mg Caps (Gabapentin) .... Take 1 Tablet By Mouth Three Times A Day. 9)  Vicodin 5-500 Mg Tabs (Hydrocodone-Acetaminophen) .... Every Six Hours As Needed. 10)  Onetouch Ultra Test  Strp (Glucose Blood) .... Test 3 Times Daily As Directed 11)  Oscal 500/200 D-3 500-200 Mg-Unit Tabs (Calcium-Vitamin D) .... Take 1 Tablet By Mouth Three Times A Day 12)  Alendronate Sodium 70 Mg Tabs (Alendronate Sodium) .... Take 1 Tablet Weekly.  Take On An Empty Stomach and Stay Sitting or Standing For At Least 30 Minutes After  Current Medications (verified): 1)  Lipitor 10 Mg Tabs (Atorvastatin Calcium) .... Take 1 Tablet By Mouth Once Daily 2)  Glucophage 1000 Mg  Tabs (Metformin Hcl) ....  Take 1 Tablet By Mouth Two Times A Day 3)  Maxzide-25 37.5-25 Mg Tabs (Triamterene-Hctz) .... Take 1 Tablet By Mouth Once Daily 4)  Omeprazole 20 Mg Cpdr (Omeprazole) .... Take One Capsue By Mouth Once Daily 5)  Fexofenadine Hcl 180 Mg Tabs (Fexofenadine Hcl) .... Take 1 Tablet By Mouth Once Daily 6)  Atenolol 50 Mg Tabs (Atenolol) .... Take 1 Tablet By Mouth Once Daily 7)  Ferrous Sulfate 324 Mg  Tbec (Ferrous Sulfate) .... Take 1 Tablet By Mouth Once A Day 8)  Neurontin 400 Mg Caps (Gabapentin) .... Take 1 Tablet By Mouth Three Times A Day. 9)  Vicodin 5-500 Mg Tabs (Hydrocodone-Acetaminophen) .... Every Six Hours As Needed. 10)  Onetouch Ultra Test  Strp (Glucose Blood) .... Test 3 Times Daily As Directed 11)  Oscal 500/200 D-3 500-200 Mg-Unit Tabs (Calcium-Vitamin D) .... Take 1 Tablet By Mouth Three Times A Day 12)  Alendronate Sodium 70 Mg Tabs (Alendronate Sodium) .... Take 1 Tablet Weekly.  Take On An Empty Stomach and Stay Sitting or Standing For At Least 30 Minutes After  Allergies: 1)  ! Lisinopril (Lisinopril)  Past History:  Past Medical History: Last updated: 09/29/2009 Diabetes mellitus, type II - dx 11/05 Hyperlipidemia Hypertension Anemia      Normal EGG, colonoscopy, and Sm. Bowel F/T 2006 Osteoporosis      09/2009:  T score -2.8  Past Surgical History: Last updated: 11/25/2008 Hysterctomy in 1980's (Fibroids)  Family History: Last updated: 11/25/2008 2 Sister in [place] 2 Brothers in 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 10th grade level education  Risk Factors: Alcohol Use: 0 (10/19/2009) Exercise: yes (10/19/2009)  Risk Factors: Smoking Status: never (10/19/2009)  Family History: Reviewed history from 11/25/2008 and no changes required. 2 Sister in [place] 2 Brothers in [place] 1 sister [place] --[place]  Social History: Reviewed history from 11/25/2008 and no changes required. 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    Physical Exam  General:  alert, well-developed, cooperative to examination, and overweight-appearing.   Mouth:  MMM Neck:  supple and no carotid bruits.   Lungs:  normal respiratory effort, no accessory muscle use, normal breath sounds, no crackles, and no wheezes.   Heart:  Normal S1 and S2.  Grade 2/6 systolic ejection murmur over outflow tract  Abdomen:  soft  and normal bowel sounds.   Msk:   no joint swelling, no joint warmth, no redness over joints, no joint deformities, and no joint instability.   Pulses:  2+ dp pulses Extremities:  trace edema Neurologic:  alert & oriented X3, cranial nerves II-XII intact, and strength normal in all extremities.   vibratory sensation decreased bilateral lower ext    Impression & Recommendations:  Problem # 1:  OSTEOPOROSIS (ICD-733.00) Explained to patient results of Dexa Scan and why it is important for her to take Alendronate and Oscal. Will consider repeat Dexa Scan in two years to reasses.    Her updated medication list for this problem includes:    Alendronate Sodium 70 Mg Tabs (Alendronate sodium) .Marland Kitchen... Take 1 tablet weekly.  take on an empty stomach and stay sitting or standing for at least 30 minutes after  Problem # 2:  HYPERTENSION (ICD-401.9) Controlled for age. Continue current regimen.  Her updated medication list for this problem includes:    Maxzide-25 37.5-25 Mg Tabs (Triamterene-hctz) .Marland Kitchen... Take 1 tablet by mouth once daily    Atenolol 50 Mg Tabs (Atenolol) .Marland Kitchen... Take 1 tablet by  mouth once daily  BP today: 143/87 Prior BP: 166/80 (09/20/2009)  Labs Reviewed: K+: 4.2 (06/13/2009) Creat: : 1.33 (06/13/2009)   Chol: 155 (06/13/2009)   HDL: 58 (06/13/2009)   LDL: 75 (06/13/2009)   TG: 108 (06/13/2009)  Problem # 3:  HYPERLIPIDEMIA (ICD-272.4) Recheck FLP and reasses.  Her updated medication list for this problem includes:    Lipitor 10 Mg Tabs (Atorvastatin calcium) .Marland Kitchen... Take 1 tablet by mouth once daily  Future Orders: T-Lipid Profile ([id number]) ... 10/20/2009  Problem # 4:  DIABETES MELLITUS, TYPE II (ICD-250.00) At goal. Continue current regimen. Diabetic Eye exam ordered.  Her updated medication list for this problem includes:    Glucophage 1000 Mg Tabs (Metformin hcl) .Marland Kitchen... Take 1 tablet by mouth two times a day  Orders: Capillary Blood Glucose/CBG [phone]) Ophthalmology Referral (Ophthalmology)  Labs Reviewed: Creat: 1.33 (06/13/2009)    Reviewed HgBA1c results: 6.3 (09/20/2009)  6.3 (06/13/2009)  Problem # 5:  DIABETIC PERIPHERAL NEUROPATHY (ICD-250.60) Neurontin was increased to 600mg  po tid. Will reasses on follow up.   Her updated medication list for this problem includes:    Glucophage 1000 Mg Tabs (Metformin hcl) .Marland Kitchen... Take 1 tablet by mouth two times a day  Problem # 6:  Preventive Health Care (ICD-V70.0) Patient due for colonoscopy. Will order referral. Will screen for lipoid disorder.   Complete Medication List: 1)  Lipitor 10 Mg Tabs (Atorvastatin calcium) .... Take 1 tablet by mouth once daily 2)  Glucophage 1000 Mg Tabs (Metformin hcl) .... Take 1 tablet by mouth two times a day 3)  Maxzide-25 37.5-25 Mg Tabs (Triamterene-hctz) .... Take 1 tablet by mouth once daily 4)  Omeprazole 20 Mg Cpdr (Omeprazole) .... Take one capsue by mouth once daily 5)  Fexofenadine Hcl 180 Mg Tabs (Fexofenadine hcl) .... Take 1 tablet by mouth once daily 6)  Atenolol 50 Mg Tabs (Atenolol) .... Take 1 tablet by mouth once daily 7)  Ferrous Sulfate  324 Mg Tbec (Ferrous sulfate) .... Take 1 tablet by mouth once a day 8)  Neurontin 400 Mg Caps (Gabapentin) .... Take 1 tablet and a half by mouth three times a day. 9)  Vicodin 5-500 Mg Tabs (Hydrocodone-acetaminophen) .... Every six hours as needed. 10)  Onetouch Ultra Test Strp (Glucose blood) .... Test 3 times daily as directed 11)  Oscal 500/200  D-3 500-200 Mg-unit Tabs (Calcium-vitamin d) .... Take 1 tablet by mouth three times a day 12)  Alendronate Sodium 70 Mg Tabs (Alendronate sodium) .... Take 1 tablet weekly.  take on an empty stomach and stay sitting or standing for at least 30 minutes after  Other Orders: Gastroenterology Referral (GI) Future Orders: T-CMP with Estimated GFR ([id number]) ... 10/20/2009 T-CBC No Diff ([id number]) ... 10/20/2009 T-TSH [phone]) ... 10/20/2009  Patient Instructions: 1)  Please schedule a follow-up appointment in 3 months. 2)  Take all medication as directed. 3)  Go get Colonoscopy. 4)  Return in the morning while fasting to get blood work.  5)  You will be called with any abnormalities in the tests scheduled or performed today.  If you don't hear from [place] within a week from when the test was performed, you can assume that your test was normal.  Process Orders Check Orders Results:     [facility]: Check successful Tests Sent for requisitioning (October 24, 2009 4:47 PM):     10/20/2009: [facility] -- T-CMP with Estimated GFR [[id number]] (signed)     10/20/2009: [facility] -- T-Lipid Profile [phone] (signed)     10/20/2009: [facility] -- T-CBC No Diff [[id number]] (signed)     10/20/2009: [facility] -- T-TSH [phone] (signed)     Prevention & Chronic Care Immunizations   Influenza vaccine: Fluvax MCR  (06/13/2009)   Influenza vaccine deferral: Refused  (11/28/2008)    Tetanus booster: 11/28/2008: Td   Tetanus booster due: 11/26/2018     Pneumococcal vaccine: Pneumovax  (11/28/2008)   Pneumococcal vaccine due: 11/25/2013    H. zoster vaccine: Not documented   H. zoster vaccine deferral: Deferred  (11/25/2008)  Colorectal Screening   Hemoccult: Not documented    Colonoscopy: 5mm Sessile ascending colon polyp; internal hemmorhoids. Pathology report: Adenomatosis Exam by Dr. Rachael Fee   (12/06/2004)   Colonoscopy due: 11/2009  Other Screening   Pap smear: Not documented   Pap smear action/deferral: Not indicated S/P hysterectomy  (09/20/2009)    Mammogram: BI-RADS CATEGORY 1:  Negative.^MM DIGITAL DIAG LTD R  (01/13/2009)   Mammogram due: 11/2008    DXA bone density scan: Hip Total: T Score < -2.5 Hip.  (-2.8)   (09/27/2009)   DXA bone density action/deferral: Ordered  (09/20/2009)   DXA scan due: 09/2010    Smoking status: never  (10/19/2009)  Diabetes Mellitus   HgbA1C: 6.3  (09/20/2009)   HgbA1C action/deferral: Ordered  (06/13/2009)   Hemoglobin A1C due: 06/03/2007    Eye exam: Not documented   Diabetic eye exam action/deferral: Ophthalmology referral  (10/19/2009)    Foot exam: yes  (08/09/2008)   Foot exam action/deferral: Do today   High risk foot: No  (08/09/2008)   Foot care education: Done  (09/20/2009)    Urine microalbumin/creatinine ratio: 6.1  (08/09/2008)   Urine microalbumin action/deferral: Not indicated   Urine microalbumin/cr due: 03/02/2008    Diabetes flowsheet reviewed?: Yes   Progress toward A1C goal: At goal  Lipids   Total Cholesterol: 155  (06/13/2009)   Lipid panel action/deferral: Lipid Panel ordered   LDL: 75  (06/13/2009)   LDL Direct: Not documented   HDL: 58  (06/13/2009)   Triglycerides: 108  (06/13/2009)   Lipid panel due: 03/02/2008    SGOT (AST): 18  (06/13/2009)   BMP action: Ordered   SGPT (ALT): 13  (06/13/2009)   Alkaline phosphatase: 88  (06/13/2009)   Total  bilirubin: 0.4  (06/13/2009)    Lipid flowsheet reviewed?: Yes   Progress toward LDL  goal: At goal  Hypertension   Last Blood Pressure: 143 / 87  (10/19/2009)   Serum creatinine: 1.33  (06/13/2009)   BMP action: Ordered   Serum potassium 4.2  (06/13/2009)    Hypertension flowsheet reviewed?: Yes   Progress toward BP goal: At goal  Self-Management Support :   Personal Goals (by the next clinic visit) :     Personal A1C goal: 6  (06/13/2009)     Personal blood pressure goal: 130/80  (06/13/2009)     Personal LDL goal: 100  (06/13/2009)    Patient will work on the following items until the next clinic visit to reach self-care goals:     Medications and monitoring: take my medicines every day, check my blood sugar, bring all of my medications to every visit, examine my feet every day  (10/19/2009)     Eating: drink diet soda or water instead of juice or soda, eat more vegetables, use fresh or frozen vegetables, eat foods that are low in salt, eat baked foods instead of fried foods, eat fruit for snacks and desserts, limit or avoid alcohol  (10/19/2009)     Activity: take a 30 minute walk every day  (10/19/2009)    Diabetes self-management support: Resources for patients handout, Written self-care plan  (10/19/2009)   Diabetes care plan printed   Last diabetes self-management training by diabetes educator: 03/06/2007    Hypertension self-management support: Resources for patients handout, Written self-care plan  (10/19/2009)   Hypertension self-care plan printed.    Lipid self-management support: Resources for patients handout, Written self-care plan  (10/19/2009)   Lipid self-care plan printed.      Resource handout printed.   Nursing Instructions: Refer for screening diabetic eye exam (see order)

## 2010-05-29 NOTE — Letter (Signed)
Summary: [facility] Instructions  [facility]  [address] [place], [place] [address]   Phone: [phone]  Fax: [phone]       Wanda Mclaughlin    1929-03-25    MRN: [id number]        Procedure Day Dorna Bloom:  Wednesday 11/29/2009     Arrival Time: 8:30 am      Procedure Time: 9:30 am     Location of Procedure:                    _ x_  [facility] (4th Floor)                        PREPARATION FOR COLONOSCOPY WITH MOVIPREP   Starting 5 days prior to your procedure Friday 7/29 do not eat nuts, seeds, popcorn, corn, beans, peas,  salads, or any raw vegetables.  Do not take any fiber supplements (e.g. Metamucil, Citrucel, and Benefiber).  THE DAY BEFORE YOUR PROCEDURE         DATE: Tuesday 8/2 1.  Drink clear liquids the entire day-NO SOLID FOOD  2.  Do not drink anything colored red or purple.  Avoid juices with pulp.  No orange juice.  3.  Drink at least 64 oz. (8 glasses) of fluid/clear liquids during the day to prevent dehydration and help the prep work efficiently.  CLEAR LIQUIDS INCLUDE: Water Jello Ice Popsicles Tea (sugar ok, no milk/cream) Powdered fruit flavored drinks Coffee (sugar ok, no milk/cream) Gatorade Juice: apple, white grape, white cranberry  Lemonade Clear bullion, consomm, broth Carbonated beverages (any kind) Strained chicken noodle soup Hard Candy                             4.  In the morning, mix first dose of MoviPrep solution:    Empty 1 Pouch A and 1 Pouch B into the disposable container    Add lukewarm drinking water to the top line of the container. Mix to dissolve    Refrigerate (mixed solution should be used within 24 hrs)  5.  Begin drinking the prep at 5:00 p.m. The MoviPrep container is divided by 4 marks.   Every 15 minutes drink the solution down to the next mark (approximately 8 oz) until the full liter is complete.   6.  Follow completed prep with 16 oz of clear liquid of your choice (Nothing red  or purple).  Continue to drink clear liquids until bedtime.  7.  Before going to bed, mix second dose of MoviPrep solution:    Empty 1 Pouch A and 1 Pouch B into the disposable container    Add lukewarm drinking water to the top line of the container. Mix to dissolve    Refrigerate  THE DAY OF YOUR PROCEDURE      DATE: Wednesday 8/3  Beginning at 4:30 a.m. (5 hours before procedure):         1. Every 15 minutes, drink the solution down to the next mark (approx 8 oz) until the full liter is complete.  2. Follow completed prep with 16 oz. of clear liquid of your choice.    3. You may drink clear liquids until 7:30 am (2 HOURS BEFORE PROCEDURE).   MEDICATION INSTRUCTIONS  Unless otherwise instructed, you should take regular prescription medications with a small sip of water   as early as possible the morning of your  procedure.  Diabetic patients - see separate instructions.   Additional medication instructions:  Stop taking Ferrous Sulfate 5 days before procedure.                                                            Do not take Maxzide day of procedure.       OTHER INSTRUCTIONS  You will need a responsible adult at least 75 years of age to accompany you and drive you home.   This person must remain in the waiting room during your procedure.  Wear loose fitting clothing that is easily removed.  Leave jewelry and other valuables at home.  However, you may wish to bring a book to read or  an iPod/MP3 player to listen to music as you wait for your procedure to start.  Remove all body piercing jewelry and leave at home.  Total time from sign-in until discharge is approximately 2-3 hours.  You should go home directly after your procedure and rest.  You can resume normal activities the  day after your procedure.  The day of your procedure you should not:   Drive   Make legal decisions   Operate machinery   Drink alcohol   Return to work  You will receive  specific instructions about eating, activities and medications before you leave.    The above instructions have been reviewed and explained to me by   Ezra Sites RN  November 15, 2009 11:05 AM    I fully understand and can verbalize these instructions _____________________________ Date _________

## 2010-05-29 NOTE — Progress Notes (Signed)
Summary: med refill/gp  Phone Note Refill Request Message from:  Fax from Pharmacy on July 20, 2009 2:03 PM  Refills Requested: Medication #1:  GLUCOPHAGE 1000 MG  TABS Take 1 tablet by mouth two times a day   Last Refilled: 11/04/2008  Method Requested: Electronic Initial call taken by: Chinita Pester RN,  July 20, 2009 2:03 PM    Prescriptions: GLUCOPHAGE 1000 MG  TABS (METFORMIN HCL) Take 1 tablet by mouth two times a day  #60 x 3   Entered and Authorized by:   Laren Everts MD   Signed by:   Laren Everts MD on 07/21/2009   Method used:   [facility] to        [facility] [phone]* (retail)       [address]       [place], [place]  [address]       Ph: [phone] or [phone]       Fax: [phone]   RxID:   [id number]

## 2010-05-29 NOTE — Progress Notes (Signed)
Summary: REfill/gh  Phone Note Refill Request Message from:  Fax from Pharmacy on May 17, 2009 10:20 AM  Refills Requested: Medication #1:  FEXOFENADINE HCL 180 MG TABS Take 1 tablet by mouth once daily   Last Refilled: 04/17/2009 Last visit was 11/28/2008 and last labs were 11/28/2008.   Method Requested: Electronic Initial call taken by: Angelina Ok RN,  May 17, 2009 10:21 AM    Prescriptions: FEXOFENADINE HCL 180 MG TABS (FEXOFENADINE HCL) Take 1 tablet by mouth once daily  #30 Tablet x 4   Entered and Authorized by:   Laren Everts MD   Signed by:   Laren Everts MD on 05/17/2009   Method used:   [facility]        [facility] [phone]* (retail)       [address]       [place], [place]  [address]       Ph: [phone] or [phone]       Fax: [phone]   RxID:   [id number]

## 2010-05-29 NOTE — Letter (Signed)
Summary: Diabetic Instructions  [facility]  [address] [place], [place] [address]   Phone: [phone]  Fax: [phone]    JANN MILKOVICH 22-Nov-1928 MRN: [id number]   _  _   ORAL DIABETIC MEDICATION INSTRUCTIONS  The day before your procedure:   Take your diabetic pill as you do normally  The day of your procedure:   Do not take your diabetic pill    We will check your blood sugar levels during the admission process and again in Recovery before discharging you home  ________________________________________________________________________

## 2010-05-29 NOTE — Miscellaneous (Signed)
Summary: [facility]  Clinical Lists Changes  Medications: Added new medication of MOVIPREP 100 GM  SOLR (PEG-KCL-NACL-NASULF-NA ASC-C) As per prep instructions. - Signed Rx of MOVIPREP 100 GM  SOLR (PEG-KCL-NACL-NASULF-NA ASC-C) As per prep instructions.;  #1 x 0;  Signed;  Entered by: Ezra Sites RN;  Authorized by: Rachael Fee MD;  Method used: [facility] [phone]*, [address] [place], [place]  [address], Ph: [phone] or [phone], Fax: [phone] Observations: Added new observation of ALLERGY REV: Done (11/15/2009 10:22)    Prescriptions: MOVIPREP 100 GM  SOLR (PEG-KCL-NACL-NASULF-NA ASC-C) As per prep instructions.  #1 x 0   Entered by:   Ezra Sites RN   Authorized by:   Rachael Fee MD   Signed by:   Ezra Sites RN on 11/15/2009   Method used:   [facility]ronically to        [facility] [phone]* (retail)       [address]       [place], [place]  [address]       Ph: [phone] or [phone]       Fax: [phone]   RxID:   [id number]

## 2010-05-29 NOTE — Letter (Signed)
Summary: Previsit letter  [facility]  [address] [place], [place] [address]   Phone: [phone]  Fax: [phone]       10/25/2009 MRN: [id number]  [facility] [address] [place], [place]  [address]  Dear Ms. Teehan,  Welcome to the Gastroenterology Division at [facility].    You are scheduled to see a nurse for your pre-procedure visit on 11-15-09 at 10:30a.m. on the 3rd floor at [facility], [address]  We ask that you try to arrive at our office 15 minutes prior to your appointment time to allow for check-in.  Your nurse visit will consist of discussing your medical and surgical history, your immediate family medical history, and your medications.    Please bring a complete list of all your medications or, if you prefer, bring the medication bottles and we will list them.  We will need to be aware of both prescribed and over the counter drugs.  We will need to know exact dosage information as well.  If you are on blood thinners (Coumadin, Plavix, Aggrenox, Ticlid, etc.) please call our office today/prior to your appointment, as we need to consult with your physician about holding your medication.   Please be prepared to read and sign documents such as consent forms, a financial agreement, and acknowledgement forms.  If necessary, and with your consent, a friend or relative is welcome to sit-in on the nurse visit with you.  Please bring your insurance card so that we may make a copy of it.  If your insurance requires a referral to see a specialist, please bring your referral form from your primary care physician.  No co-pay is required for this nurse visit.     If you cannot keep your appointment, please call [phone] to cancel or reschedule prior to your appointment date.  This allows [place] the opportunity to schedule an appointment for another patient in need of care.    Thank you for choosing [facility] for your medical  needs.  We appreciate the opportunity to care for you.  Please visit [place] at our website  to learn more about our practice.                     Sincerely.                                                                                                                   The Gastroenterology Division

## 2010-05-29 NOTE — Progress Notes (Signed)
Summary: Reflll/gh  Phone Note Refill Request Message from:  Fax from Pharmacy on October 06, 2009 9:33 AM  Refills Requested: Medication #1:  VICODIN 5-500 MG TABS Every six hours as needed.   Last Refilled: 08/17/2009  Method Requested: Electronic Initial call taken by: Angelina Ok RN,  October 06, 2009 9:34 AM    Prescriptions: VICODIN 5-500 MG TABS (HYDROCODONE-ACETAMINOPHEN) Every six hours as needed.  #90 x 0   Entered and Authorized by:   Laren Everts MD   Signed by:   Laren Everts MD on 10/06/2009   Method used:   Telephoned to ...       [facility] [place] [phone]* (retail)       [id number]       [place], [place]  [address]       Ph: [phone] or [phone]       Fax: [phone]   RxID:   [id number]

## 2010-05-29 NOTE — Letter (Signed)
Summary: DR. Nadyne Coombes  DR. Nadyne Coombes   Imported By: Margie Billet 10/06/2009 08:38:52  _____________________________________________________________________  External Attachment:    Type:   Image     Comment:   External Document  Appended Document: DR. Nadyne Coombes   Diabetic Eye Exam  Procedure date:  09/04/2009  Findings:      No diabetic retinopathy.   Cataract.  'immature right eye'   Procedures Next Due Date:    Diabetic Eye Exam: 12/2009   Diabetic Eye Exam  Procedure date:  09/04/2009  Findings:      No diabetic retinopathy.   Cataract.  'immature right eye'   Procedures Next Due Date:    Diabetic Eye Exam: 12/2009

## 2010-05-29 NOTE — Consult Note (Signed)
Summary: DR C DAVID MILLER  DR C DAVID MILLER   Imported By: Louretta Parma 11/14/2009 11:05:32  _____________________________________________________________________  External Attachment:    Type:   Image     Comment:   External Document  Appended Document: DR C DAVID MILLER   Diabetic Eye Exam  Procedure date:  10/31/2009  Findings:      No diabetic retinopathy.   Cataract.  referred to Dr. Vonna Kotyk for cataract surgery   Procedures Next Due Date:    Diabetic Eye Exam: 10/2010   Diabetic Eye Exam  Procedure date:  10/31/2009  Findings:      No diabetic retinopathy.   Cataract.  referred to Dr. Vonna Kotyk for cataract surgery   Procedures Next Due Date:    Diabetic Eye Exam: 10/2010

## 2010-05-29 NOTE — Progress Notes (Signed)
Summary: Refill/gh  Phone Note Refill Request Message from:  Fax from Pharmacy on December 29, 2009 2:39 PM  Refills Requested: Medication #1:  VICODIN 5-500 MG TABS Every six hours as needed.   Last Refilled: 11/03/2009  Method Requested: Electronic Initial call taken by: Angelina Ok RN,  December 29, 2009 2:39 PM    Prescriptions: VICODIN 5-500 MG TABS (HYDROCODONE-ACETAMINOPHEN) Every six hours as needed.  #90 x 0   Entered and Authorized by:   Laren Everts MD   Signed by:   Laren Everts MD on 01/02/2010   Method used:   Telephoned to ...       [facility] [place] [phone][facility] ([facility]ail)       [phone]       [place], [place]  [address]       Ph: [phone] or [phone]       Fax: [phone]   RxID:   [id number]

## 2010-05-29 NOTE — Progress Notes (Signed)
  Phone Note Outgoing Call   Call placed by: Theotis Barrio NT II,  November 09, 2009 2:23 PM Details for Reason: FOOLOW UP APPT EYE Summary of Call: CALLED PATIENT , LEFT VOICE MESSAGE FOR PATIENT TO CALL THE CLINIC/ APPT IN, BUT DID NOT ENTER DR. INFO.Lela Sturdivant NT II  November 09, 2009 2:24 PM

## 2010-05-29 NOTE — Progress Notes (Signed)
Summary: refill/gg  Phone Note Refill Request  on Sep 14, 2009 4:43 PM  Refills Requested: Medication #1:  MAXZIDE-25 37.5-25 MG TABS Take 1 tablet by mouth once daily   Last Refilled: 08/13/2009  Method Requested: Electronic Initial call taken by: Merrie Roof RN,  Sep 14, 2009 4:43 PM    Prescriptions: MAXZIDE-25 37.5-25 MG TABS (TRIAMTERENE-HCTZ) Take 1 tablet by mouth once daily  #30 x 5   Entered and Authorized by:   Laren Everts MD   Signed by:   Laren Everts MD on 09/16/2009   Method used:   Electronically to        [facility] [phone]* (retail)       [address]       [place], [place]  [address]       Ph: [phone] or [phone]       Fax: [phone]   RxID:   [id number]

## 2010-05-29 NOTE — Progress Notes (Signed)
Summary: refill/gg  Phone Note Refill Request  on October 27, 2009 3:32 PM  Refills Requested: Medication #1:  VICODIN 5-500 MG TABS Every six hours as needed.   Last Refilled: 10/06/2009  Method Requested: Fax to Local Pharmacy Initial call taken by: Merrie Roof RN,  October 27, 2009 3:33 PM    Not due for refill.

## 2010-05-29 NOTE — Progress Notes (Signed)
Summary: refill/gg  Phone Note Refill Request  on November 03, 2009 8:46 AM  Refills Requested: Medication #1:  VICODIN 5-500 MG TABS Every six hours as needed.   Last Refilled: 10/06/2009 Refill is due on sunday, will you refill today?   Method Requested: Fax to Local Pharmacy Initial call taken by: Gayle Gambaccini RN,  November 03, 2009 8:46 AM    Prescriptions: VICODIN 5-500 MG TABS (HYDROCODONE-ACETAMINOPHEN) Every six hours as needed.  #90 x 0   Entered and Authorized by:    V[place] MD   Signed by:    Vega-Casas[place] MD on 11/03/2009   Method used:   Telephoned to ...       [facility]  [address] [phone])       [address], [place]  [address]       Ph: [phone] or [phone]       Fax: [phone]   RxID:   [id number]   Appended Document: refill/gg Rx faxed in

## 2010-05-31 NOTE — Assessment & Plan Note (Signed)
Summary: EST-CK/FU/MEDS/CFB   Vital Signs:  Patient profile:   77 year old female Height:      63 inches (160.02 cm) Weight:      185.03 pounds (84.10 kg) BMI:     32.90 Temp:     97.5 degrees F (36.39 degrees C) oral Pulse rate:   63 / minute BP sitting:   168 / 79  (right arm) Cuff size:   large  Vitals Entered By: Angelina Ok RN (May 03, 2010 3:18 PM) CC: Depression Is Patient Diabetic? Yes Did you bring your meter with you today? No Pain Assessment Patient in pain? yes     Location: knee left Intensity: 4 Type: aching Onset of pain  Constant Nutritional Status BMI of > 30 = obese CBG Result 112  Have you ever been in a relationship where you felt threatened, hurt or afraid?No   Does patient need assistance? Functional Status Self care Ambulation Impaired:Risk for fall Comments Numbness in feet sometimes.  Needs refills on meds.     Primary Care Provider:  Laren Everts MD  CC:  Depression.  History of Present Illness: 77 yr old woman with pmhx as described below comes to the clinic for hypertension management. Patient has no complians. Reports that she is taking all medicaiton as directed. Needs a refill on her medications. Patient does report to be taking all medication as directed.  Depression History:      The patient denies a depressed mood most of the day and a diminished interest in her usual daily activities.         Preventive Screening-Counseling & Management  Alcohol-Tobacco     Alcohol drinks/day: 0     Smoking Status: never  Problems Prior to Update: 1)  Diabetic Peripheral Neuropathy  (ICD-250.60) 2)  Osteoporosis  (ICD-733.00) 3)  Preventive Health Care  (ICD-V70.0) 4)  Otitis Externa, Acute, Right  (ICD-380.12) 5)  Ankle Edema  (ICD-782.3) 6)  Renal Disease, Chronic, Mild  (ICD-585.2) 7)  Systolic Murmur  (ICD-785.2) 8)  Knee Pain, Left, Chronic  (ICD-719.46) 9)  Thigh Pain  (ICD-729.5) 10)  Paresthesia  (ICD-782.0) 11)   Encounter For Therapeutic Drug Monitoring  (ICD-V58.83) 12)  Aftercare, Long-term Use, Medications Nec  (ICD-V58.69) 13)  Anemia Nos  (ICD-285.9) 14)  Degenerative Joint Disease  (ICD-715.90) 15)  Weight Loss, Abnormal  (ICD-783.21) 16)  Duodenitis  (ICD-535.60) 17)  Gastritis  (ICD-535.50) 18)  Vertigo  (ICD-780.4) 19)  Hypertension  (ICD-401.9) 20)  Hyperlipidemia  (ICD-272.4) 21)  Diabetes Mellitus, Type II  (ICD-250.00)  Medications Prior to Update: 1)  Lipitor 10 Mg Tabs (Atorvastatin Calcium) .... Take 1 Tablet By Mouth Once Daily 2)  Glucophage 1000 Mg  Tabs (Metformin Hcl) .... Take 1 Tablet By Mouth Two Times A Day 3)  Maxzide-25 37.5-25 Mg Tabs (Triamterene-Hctz) .... Take 1 Tablet By Mouth Once Daily 4)  Omeprazole 20 Mg Cpdr (Omeprazole) .... Take One Capsue By Mouth Once Daily 5)  Fexofenadine Hcl 180 Mg Tabs (Fexofenadine Hcl) .... Take 1 Tablet By Mouth Once Daily 6)  Atenolol 50 Mg Tabs (Atenolol) .... Take 1 Tablet By Mouth Once Daily 7)  Ferrous Sulfate 324 Mg  Tbec (Ferrous Sulfate) .... Take 1 Tablet By Mouth Once A Day 8)  Neurontin 400 Mg Caps (Gabapentin) .... Take 1 Tablet and A Half By Mouth Three Times A Day. 9)  Vicodin 5-500 Mg Tabs (Hydrocodone-Acetaminophen) .... Every Six Hours As Needed. 10)  Onetouch Ultra Test  Strp (Glucose  Blood) .... Test 3 Times Daily As Directed 11)  Oscal 500/200 D-3 500-200 Mg-Unit Tabs (Calcium-Vitamin D) .... Take 1 Tablet By Mouth Three Times A Day 12)  Alendronate Sodium 70 Mg Tabs (Alendronate Sodium) .... Take 1 Tablet Weekly.  Take On An Empty Stomach and Stay Sitting or Standing For At Least 30 Minutes After  Current Medications (verified): 1)  Lipitor 10 Mg Tabs (Atorvastatin Calcium) .... Take 1 Tablet By Mouth Once Daily 2)  Glucophage 1000 Mg  Tabs (Metformin Hcl) .... Take 1 Tablet By Mouth Two Times A Day 3)  Maxzide-25 37.5-25 Mg Tabs (Triamterene-Hctz) .... Take 1 Tablet By Mouth Once Daily 4)  Omeprazole 20 Mg  Cpdr (Omeprazole) .... Take One Capsue By Mouth Once Daily 5)  Fexofenadine Hcl 180 Mg Tabs (Fexofenadine Hcl) .... Take 1 Tablet By Mouth Once Daily 6)  Atenolol 50 Mg Tabs (Atenolol) .... Take 1 Tablet By Mouth Once Daily 7)  Ferrous Sulfate 324 Mg  Tbec (Ferrous Sulfate) .... Take 1 Tablet By Mouth Once A Day 8)  Neurontin 400 Mg Caps (Gabapentin) .... Take 1 Tablet and A Half By Mouth Three Times A Day. 9)  Vicodin 5-500 Mg Tabs (Hydrocodone-Acetaminophen) .... Every Six Hours As Needed. 10)  Onetouch Ultra Test  Strp (Glucose Blood) .... Test 3 Times Daily As Directed 11)  Oscal 500/200 D-3 500-200 Mg-Unit Tabs (Calcium-Vitamin D) .... Take 1 Tablet By Mouth Three Times A Day 12)  Alendronate Sodium 70 Mg Tabs (Alendronate Sodium) .... Take 1 Tablet Weekly.  Take On An Empty Stomach and Stay Sitting or Standing For At Least 30 Minutes After 13)  Norvasc 5 Mg Tabs (Amlodipine Besylate) .... Take 1 Tablet By Mouth Once A Day  Allergies: 1)  ! Lisinopril (Lisinopril)  Past History:  Past Medical History: Last updated: 09/29/2009 Diabetes mellitus, type II - dx 11/05 Hyperlipidemia Hypertension Anemia      Normal EGG, colonoscopy, and Sm. 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 10th grade level education  Risk Factors: Alcohol Use: 0 (05/03/2010) Exercise: yes (10/19/2009)  Risk Factors: Smoking Status: never (05/03/2010)  Family History: Reviewed history from 11/25/2008 and no changes required. 2 Sister in [place] 2 Brothers in [place] 1 sister [place] --[place]  Social History: Reviewed history from  11/25/2008 and no changes required. 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    Physical Exam  General:  NAD, vitals reviewed Mouth:  MMM Neck:  supple and no carotid bruits.   Lungs:  normal respiratory effort, no accessory muscle use, normal breath sounds, no crackles, and no wheezes.   Heart:  Normal S1 and S2.  Grade 2/6 systolic ejection murmur over outflow tract  Abdomen:  soft and normal bowel sounds.   Msk:   no joint swelling, no joint  warmth, no redness over joints, no joint deformities, and no joint instability.   Extremities:  trace edema Neurologic:  alert & oriented X3, cranial nerves II-XII intact, and strength normal in all extremities.   vibratory sensation decreased bilateral lower ext   Diabetes Management Exam:    Foot Exam (with socks and/or shoes not present):       Sensory-Monofilament:          Left foot: normal          Right foot: normal   Impression & Recommendations:  Problem # 1:  HYPERTENSION (ICD-401.9) Uncontrolled. Will start patient on norvasc and recheck blood pressure on follow up.  Her updated medication list for this problem includes:    Maxzide-25 37.5-25 Mg Tabs (Triamterene-hctz) .Marland Kitchen... Take 1 tablet by mouth once daily    Atenolol 50 Mg Tabs (Atenolol) .Marland Kitchen... Take 1 tablet by mouth once daily    Norvasc 5 Mg Tabs (Amlodipine besylate) .Marland Kitchen... Take 1 tablet by mouth once a day  BP today: 168/79 Prior BP: 143/87 (10/19/2009)  Labs Reviewed: K+: 4.3 (10/26/2009) Creat: : 1.42 (10/26/2009)   Chol: 143 (10/26/2009)   HDL: 48 (10/26/2009)   LDL: 73  (10/26/2009)   TG: 111 (10/26/2009)  Problem # 2:  DIABETIC PERIPHERAL NEUROPATHY (ICD-250.60) Improved. Will continue current regimen.  Her updated medication list for this problem includes:    Glucophage 1000 Mg Tabs (Metformin hcl) .Marland Kitchen... Take 1 tablet by mouth two times a day  Problem # 3:  RENAL DISEASE, CHRONIC, MILD (ICD-585.2) Recheck renal function and reassess.  Problem # 4:  DIABETES MELLITUS, TYPE II (ICD-250.00) At goal. Continue current regimen.  Her updated medication list for this problem includes:    Glucophage 1000 Mg Tabs (Metformin hcl) .Marland Kitchen... Take 1 tablet by mouth two times a day  Orders: T- Capillary Blood Glucose (16109) T-Hgb A1C (in-house) (60454UJ)  Labs Reviewed: Creat: 1.42 (10/26/2009)     Last Eye Exam: No diabetic retinopathy.   Cataract.  referred to Dr. Vonna Kotyk for cataract surgery  (10/31/2009) Reviewed HgBA1c results: 6.2 (05/03/2010)  6.3 (09/20/2009)  Problem # 5:  HYPERLIPIDEMIA (ICD-272.4) At goal. Recheck FLP and cmet today.  Her updated medication list for this problem includes:    Lipitor 10 Mg Tabs (Atorvastatin calcium) .Marland Kitchen... Take 1 tablet by mouth once daily  Orders: T-CMP with Estimated GFR ([address]) T-Lipid Profile (95621-30865)  Labs Reviewed: SGOT: 13 (10/26/2009)   SGPT: 11 (10/26/2009)   HDL:48 (10/26/2009), 58 (06/13/2009)  LDL:73 (10/26/2009), 75 (06/13/2009)  Chol:143 (10/26/2009), 155 (06/13/2009)  Trig:111 (10/26/2009), 108 (06/13/2009)  Complete Medication List: 1)  Lipitor 10 Mg Tabs (Atorvastatin calcium) .... Take 1 tablet by mouth once daily 2)  Glucophage 1000 Mg Tabs (Metformin hcl) .... Take 1 tablet by mouth two times a day 3)  Maxzide-25 37.5-25 Mg Tabs (Triamterene-hctz) .... Take 1 tablet by mouth once daily 4)  Omeprazole 20 Mg Cpdr (Omeprazole) .... Take one capsue by mouth once daily 5)  Fexofenadine Hcl 180 Mg Tabs (Fexofenadine hcl) .... Take 1 tablet by mouth once daily 6)  Atenolol 50 Mg Tabs  (Atenolol) .... Take 1 tablet by mouth once daily 7)  Ferrous Sulfate 324 Mg Tbec (Ferrous sulfate) .... Take 1 tablet by mouth once a day 8)  Neurontin 400 Mg Caps (Gabapentin) .... Take 1 tablet and a half by mouth three times a day. 9)  Vicodin 5-500 Mg Tabs (Hydrocodone-acetaminophen) .... Every six hours as needed. 10)  Onetouch Ultra Test Strp (  Glucose blood) .... Test 3 times daily as directed 11)  Oscal 500/200 D-3 500-200 Mg-unit Tabs (Calcium-vitamin d) .... Take 1 tablet by mouth three times a day 12)  Alendronate Sodium 70 Mg Tabs (Alendronate sodium) .... Take 1 tablet weekly.  take on an empty stomach and stay sitting or standing for at least 30 minutes after 13)  Norvasc 5 Mg Tabs (Amlodipine besylate) .... Take 1 tablet by mouth once a day  Patient Instructions: 1)  Please schedule a follow-up appointment in 2 months recheck Blood pressure. 2)  Start taking Norvasc as indicated for Hypertension. 3)  You will be called with any abnormalities in the tests scheduled or performed today.  If you don't hear from [place] within a week from when the test was performed, you can assume that your test was normal.  Prescriptions: VICODIN 5-500 MG TABS (HYDROCODONE-ACETAMINOPHEN) Every six hours as needed.  #90 x 0   Entered and Authorized by:   Laren Everts MD   Signed by:   Laren Everts MD on 05/03/2010   Method used:   Print then Give to Patient   RxID:   [id number] NORVASC 5 MG TABS (AMLODIPINE BESYLATE) Take 1 tablet by mouth once a day  #30 x 2   Entered and Authorized by:   Laren Everts MD   Signed by:   Laren Everts MD on 05/03/2010   Method used:   Electronically to        [facility] [phone]* (retail)       [address], [place]  [address]       Ph: [phone] or [phone]       Fax: [phone]   RxID:   [id number] NEURONTIN 400 MG CAPS (GABAPENTIN) Take 1 tablet and a half by mouth three times a day.  #90.0  Capsule x 2   Entered and Authorized by:   Laren Everts MD   Signed by:   Laren Everts MD on 05/03/2010   Method used:   Electronically to        [facility] [phone]* (retail)       [address] [place]  [address]       Ph: [phone] or [phone]       Fax: [phone]   RxID:   [id number] FERROUS SULFATE 324 MG  TBEC (FERROUS SULFATE) Take 1 tablet by mouth once a day  #30 x 2   Entered and Authorized by:   Laren Everts MD   Signed by:   Laren Everts MD on 05/03/2010   Method used:   [facility] to        [facility] [phone]* (retail)       [address], [place]  [address]       Ph: [phone] or [phone]       Fax: [phone]   RxID:   [id number] ATENOLOL 50 MG TABS (ATENOLOL) Take 1 tablet by mouth once daily  #30 x 2   Entered and Authorized by:   Laren Everts MD   Signed by:   Laren Everts MD on 05/03/2010   Method used:   Electronically to        [facility]. [phone]* (retail)       [address]       [place], [place]  [address]       Ph: [phone] or [phone]       Fax: [phone]   RxID:   [id number] OMEPRAZOLE 20 MG CPDR (OMEPRAZOLE) Take one capsue by mouth once daily  #30 x 2   Entered and Authorized by:   Laren Everts MD   Signed by:   Laren Everts MD on 05/03/2010   Method used:   [facility]ronically to        [facility] [phone]* (retail)       [address] [place]  [address]       Ph: [phone] or [phone]       Fax: [phone]   RxID:   [id number] MAXZIDE-25 37.5-25 MG TABS (TRIAMTERENE-HCTZ) Take 1 tablet by mouth once daily  #30 x 2   Entered and Authorized by:   Laren Everts MD   Signed by:   Laren Everts MD on 05/03/2010   Method used:   Electronically to        [facility]. [phone]* (retail)       [address], [place]  [address]        Ph: [phone] or [phone]       Fax: [phone]   RxID:   [id number] GLUCOPHAGE 1000 MG  TABS (METFORMIN HCL) Take 1 tablet by mouth two times a day  #60 x 2   Entered and Authorized by:   Laren Everts MD   Signed by:   Laren Everts MD on 05/03/2010   Method used:   [facility]ronically to        [facility] [phone]* (retail)       [address], [place]  [address]       Ph: [phone] or [phone]       Fax: [phone]   RxID:   [id number] LIPITOR 10 MG TABS (ATORVASTATIN CALCIUM) Take 1 tablet by mouth once daily  #30 x 2   Entered and Authorized by:   Laren Everts MD   Signed by:   Laren Everts MD on 05/03/2010   Method used:   [facility] to        [facility] [phone]* (retail)       [address] [place]  [address]       Ph: [phone] or [phone]       Fax: [phone]   RxID:   [id number]    Orders Added: 1)  T- Capillary Blood Glucose [82948] 2)  T-Hgb A1C (in-house) [83036QW] 3)  T-CMP with Estimated GFR [[address]] 4)  T-Lipid Profile [80061-22930] 5)  Est. Patient Level III [17408]     Vital Signs:  Patient profile:   77 year old female Height:      63 inches (160.02 cm) Weight:      185.03 pounds (84.10 kg) BMI:     32.90 Temp:     97.5 degrees F (36.39 degrees C) oral Pulse rate:   63 / minute BP sitting:   168 / 79  (right arm) Cuff size:   large  Vitals Entered By: Angelina Ok RN (May 03, 2010 3:18 PM)    Last LDL:  73 (10/26/2009 8:42:00 PM)        Diabetic Foot Exam Last Podiatry Exam Date: 09/20/2009 Foot Inspection Is there a history of a foot ulcer?              No Is there a foot ulcer now?              No Can the patient see the bottom of their feet?          Yes Are the shoes appropriate in style and fit?          Yes Is there swelling or an abnormal foot shape?          No Are the  toenails long?                No Are the toenails thick?                No Are the toenails ingrown?              No Is there heavy callous build-up?              No Is there a claw toe deformity?                          No Is there elevated skin temperature?            No Is there limited ankle dorsiflexion?            No Is there foot or ankle muscle weakness?            No Do you have pain in calf while walking?           No      Pulse Check          Right Foot          Left Foot Posterior Tibial:        3+            3+ Dorsalis Pedis:        3+            3+  High Risk Feet? No   10-g (5.07) Semmes-Weinstein Monofilament Test Performed by: Angelina Ok RN          Right Foot          Left Foot Visual Inspection     normal          Test Control      normal         normal Site 1         normal         normal Site 2         normal         normal Site 3         normal         normal Site 4         normal         normal Site 5         normal         normal Site 6         normal         normal Site 7         normal         normal Site 8         normal  normal Site 9         normal         normal Site 10         normal         normal  Impression      normal         normal   Prevention & Chronic Care Immunizations   Influenza vaccine: Fluvax MCR  (06/13/2009)   Influenza vaccine deferral: Refused  (11/28/2008)    Tetanus booster: 11/28/2008: Td   Tetanus booster due: 11/26/2018    Pneumococcal vaccine: Pneumovax  (11/28/2008)   Pneumococcal vaccine due: 11/25/2013    H. zoster vaccine: Not documented   H. zoster vaccine deferral: Deferred  (11/25/2008)  Colorectal Screening   Hemoccult: Not documented    Colonoscopy: DONE  (11/29/2009)   Colonoscopy due: 11/2009  Other Screening   Pap smear: Not documented   Pap smear action/deferral: Not indicated S/P hysterectomy  (09/20/2009)    Mammogram: 41324.1^MM DIGITAL DIAGNOSTIC UNILAT R  (03/12/2010)    Mammogram due: 11/2008    DXA bone density scan: Hip Total: T Score < -2.5 Hip.  (-2.8)   (09/27/2009)   DXA bone density action/deferral: Ordered  (09/20/2009)   DXA scan due: 09/2010    Smoking status: never  (05/03/2010)  Diabetes Mellitus   HgbA1C: 6.2  (05/03/2010)   HgbA1C action/deferral: Ordered  (06/13/2009)   Hemoglobin A1C due: 06/03/2007    Eye exam: No diabetic retinopathy.   Cataract.  referred to Dr. Vonna Kotyk for cataract surgery   (10/31/2009)   Diabetic eye exam action/deferral: Ophthalmology referral  (10/19/2009)   Eye exam due: 10/2010    Foot exam: yes  (05/03/2010)   Foot exam action/deferral: Do today   High risk foot: No  (05/03/2010)   Foot care education: Done  (09/20/2009)    Urine microalbumin/creatinine ratio: 6.1  (08/09/2008)   Urine microalbumin action/deferral: Not indicated   Urine microalbumin/cr due: 03/02/2008    Diabetes flowsheet reviewed?: Yes   Progress toward A1C goal: At goal  Lipids   Total Cholesterol: 143  (10/26/2009)   Lipid panel action/deferral: Lipid Panel ordered   LDL: 73  (10/26/2009)   LDL Direct: Not documented   HDL: 48  (10/26/2009)   Triglycerides: 111  (10/26/2009)   Lipid panel due: 03/02/2008    SGOT (AST): 13  (10/26/2009)   BMP action: Ordered   SGPT (ALT): 11  (10/26/2009)   Alkaline phosphatase: 67  (10/26/2009)   Total bilirubin: 0.5  (10/26/2009)    Lipid flowsheet reviewed?: Yes   Progress toward LDL goal: At goal  Hypertension   Last Blood Pressure: 168 / 79  (05/03/2010)   Serum creatinine: 1.42  (10/26/2009)   BMP action: Ordered   Serum potassium 4.3  (10/26/2009)    Hypertension flowsheet reviewed?: Yes   Progress toward BP goal: At goal  Self-Management Support :   Personal Goals (by the next clinic visit) :     Personal A1C goal: 6  (06/13/2009)     Personal blood pressure goal: 130/80  (06/13/2009)     Personal LDL goal: 100  (06/13/2009)    Patient will work on the following  items until the next clinic visit to reach self-care goals:     Medications and monitoring: take my medicines every day, check my blood sugar, bring all of my medications to every visit, examine my feet every day  (05/03/2010)     Eating: drink diet soda or water  instead of juice or soda, eat more vegetables, use fresh or frozen vegetables, eat foods that are low in salt, eat baked foods instead of fried foods, eat fruit for snacks and desserts, limit or avoid alcohol  (05/03/2010)     Activity: take a 30 minute walk every day  (05/03/2010)    Diabetes self-management support: Written self-care plan, Resources for patients handout  (05/03/2010)   Diabetes care plan printed   Last diabetes self-management training by diabetes educator: 03/06/2007    Hypertension self-management support: Written self-care plan, Education handout, Pre-printed educational material, Resources for patients handout  (05/03/2010)   Hypertension self-care plan printed.   Hypertension education handout printed    Lipid self-management support: Written self-care plan, Education handout, Pre-printed educational material, Resources for patients handout  (05/03/2010)   Lipid self-care plan printed.   Lipid education handout printed      Resource handout printed.      Laboratory Results   Blood Tests   Date/Time Received: May 03, 2010 3:53 PM Date/Time Reported: Alric Quan  May 03, 2010 3:53 PM   HGBA1C: 6.2%   (Normal Range: Non-Diabetic - 3-6%   Control Diabetic - 6-8%) CBG Random:: 112mg /dL     Process Orders Check Orders Results:     [facility]: Check successful Tests Sent for requisitioning (May 04, 2010 12:54 PM):     05/03/2010: [facility] -- T-CMP with Estimated GFR [[address]] (signed)     05/03/2010: [facility] -- T-Lipid Profile [phone] (signed)

## 2010-06-14 NOTE — Progress Notes (Signed)
Summary: Medication  Phone Note Refill Request Message from:  Pharmacy on May 23, 2010 1:36 PM  Refills Requested: Medication #1:  NEURONTIN 400 MG CAPS Take 1 tablet and a half by mouth three times a day. Call from pharmacy.  They will be unable to have pt break the capsule.  Suggestion to give pt 600 mg tablets instead to get the currect dosing.   Method Requested: Electronic Initial call taken by: Angelina Ok RN,  May 23, 2010 1:37 PM    New/Updated Medications: NEURONTIN 600 MG TABS (GABAPENTIN) Take 1 tablet by mouth three times a day Prescriptions: NEURONTIN 600 MG TABS (GABAPENTIN) Take 1 tablet by mouth three times a day  #90 x 1   Entered and Authorized by:   Laren Everts MD   Signed by:   Laren Everts MD on 06/06/2010   Method used:   [facility] to        [facility] [phone]* (retail)       [address]       [place], [place]  [address]       Ph: [phone] or [phone]       Fax: [phone]   RxID:   [id number]

## 2010-06-28 ENCOUNTER — Other Ambulatory Visit: Payer: Self-pay | Admitting: *Deleted

## 2010-06-28 MED ORDER — HYDROCODONE-ACETAMINOPHEN 5-500 MG PO TABS: 1.0000 | ORAL_TABLET | Freq: Four times a day (QID) | ORAL | Status: DC | PRN

## 2010-07-05 ENCOUNTER — Ambulatory Visit (INDEPENDENT_AMBULATORY_CARE_PROVIDER_SITE_OTHER): Payer: PRIVATE HEALTH INSURANCE | Admitting: Internal Medicine

## 2010-07-05 ENCOUNTER — Encounter: Payer: Self-pay | Admitting: Internal Medicine

## 2010-07-05 DIAGNOSIS — I10 Essential (primary) hypertension: Secondary | ICD-10-CM

## 2010-07-05 DIAGNOSIS — Z0000 Encounter for general adult medical examination without abnormal findings: Secondary | ICD-10-CM

## 2010-07-05 DIAGNOSIS — K5900 Constipation, unspecified: Secondary | ICD-10-CM

## 2010-07-05 DIAGNOSIS — E119 Type 2 diabetes mellitus without complications: Secondary | ICD-10-CM

## 2010-07-05 MED ORDER — ACCU-CHEK ADVANTAGE DIABETES KIT: PACK | Status: DC

## 2010-07-05 MED ORDER — GLUCOSE BLOOD VI STRP: ORAL_STRIP | Status: DC

## 2010-07-05 MED ORDER — SENNOSIDES 8.6 MG PO TABS: ORAL_TABLET | ORAL | Status: DC

## 2010-07-05 NOTE — Progress Notes (Signed)
  Subjective:    Patient ID: [name], female    DOB: 1929/04/15, 77 y.o.   MRN: [id number]  HPI  77 yr old woman with  Past Medical History  Diagnosis Date  . Diabetic peripheral neuropathy   . Chronic renal failure   . Chronic knee pain   . Degenerative joint disease   . Hypertension   . Hyperlipidemia   . Diabetes mellitus   . Vertigo   . Duodenitis   . Gastritis   . Anemia     Normal EGG, colonoscopy, and Sm. Bowel F/T 2006  . Osteoporosis      09/2009:  T score -2.8  . Health maintenance examination     Mammogram 10/11: Possible mass, right breast. No evidence of malignancy in left breast. Needle biopsy of left breast 12/11: No evidence of  carcinoma. Patient needs follow-up right breast diagnostic mammogram with ultrasound in 6 months.   comes to the clinic for follow up of Hypertension. Patient reports to be taking all medication as prescribed. Denies chest pain, palpitations, diaphoresis, or shortness of breath.  Reports that since she started to take a lot of her medications. She has constipation.  Review of Systems  All other systems reviewed and are negative.       Objective:   Physical Exam  General:  NAD, vitals reviewed Mouth:  MMM Neck:  supple and no carotid bruits.   Lungs:  normal respiratory effort, no accessory muscle use, normal breath sounds, no crackles, and no wheezes.   Heart:  Normal S1 and S2.  Grade 2/6 systolic ejection murmur over outflow tract  Abdomen:  soft and normal bowel sounds.   Msk:   no joint swelling, no joint warmth, no redness over joints, no joint deformities, and no joint instability.   Extremities:  trace edema Neurologic:  alert & oriented X3, cranial nerves II-XII intact, and strength normal in all extremities.   vibratory sensation decreased bilateral lower ext    Assessment & Plan:

## 2010-07-05 NOTE — Assessment & Plan Note (Signed)
2/2 to meds. Will start patient on Senokot and follow up.

## 2010-07-05 NOTE — Patient Instructions (Signed)
Make follow up appointment in 1 month. Start taking Senokot for constipation. Continue taking all other medication as prescribed.

## 2010-07-05 NOTE — Assessment & Plan Note (Signed)
Repeat BP at goal. Will continue current regimen. Follow up in one month.

## 2010-07-05 NOTE — Assessment & Plan Note (Signed)
Patient needs follow-up right breast diagnostic mammogram with ultrasound in 6 months (6/12). Will order.

## 2010-07-09 LAB — BASIC METABOLIC PANEL
BUN: 18 mg/dL (ref 6–23)
Chloride: 106 mEq/L (ref 96–112)
Creatinine, Ser: 1.31 mg/dL — ABNORMAL HIGH (ref 0.4–1.2)
GFR calc non Af Amer: 39 mL/min — ABNORMAL LOW (ref >60)
Glucose, Bld: 96 mg/dL (ref 70–99)
Potassium: 4.4 mEq/L (ref 3.5–5.1)

## 2010-07-09 LAB — CBC
HCT: 35.5 % — ABNORMAL LOW (ref 36.0–46.0)
MCHC: 33 g/dL (ref 30.0–36.0)
MCV: 96.2 fL (ref 78.0–100.0)
Platelets: 222 10*3/uL (ref 150–400)
RDW: 13.6 % (ref 11.5–15.5)
WBC: 5.3 10*3/uL (ref 4.0–10.5)

## 2010-07-09 LAB — DIFFERENTIAL
Basophils Absolute: 0 10*3/uL (ref 0.0–0.1)
Basophils Relative: 0 % (ref 0–1)
Eosinophils Relative: 5 % (ref 0–5)
Lymphocytes Relative: 31 % (ref 12–46)
Monocytes Absolute: 0.4 10*3/uL (ref 0.1–1.0)

## 2010-07-09 LAB — GLUCOSE, CAPILLARY: Glucose-Capillary: 99 mg/dL (ref 70–99)

## 2010-07-09 LAB — SURGICAL PCR SCREEN: Staphylococcus aureus: POSITIVE — AB

## 2010-07-13 LAB — GLUCOSE, CAPILLARY: Glucose-Capillary: 125 mg/dL — ABNORMAL HIGH (ref 70–99)

## 2010-08-05 LAB — GLUCOSE, CAPILLARY: Glucose-Capillary: 81 mg/dL (ref 70–99)

## 2010-08-07 ENCOUNTER — Encounter: Payer: Self-pay | Admitting: Ophthalmology

## 2010-08-07 ENCOUNTER — Ambulatory Visit (INDEPENDENT_AMBULATORY_CARE_PROVIDER_SITE_OTHER): Payer: PRIVATE HEALTH INSURANCE | Admitting: Ophthalmology

## 2010-08-07 DIAGNOSIS — I10 Essential (primary) hypertension: Secondary | ICD-10-CM

## 2010-08-07 DIAGNOSIS — E119 Type 2 diabetes mellitus without complications: Secondary | ICD-10-CM

## 2010-08-07 DIAGNOSIS — K2970 Gastritis, unspecified, without bleeding: Secondary | ICD-10-CM

## 2010-08-07 DIAGNOSIS — E785 Hyperlipidemia, unspecified: Secondary | ICD-10-CM

## 2010-08-07 DIAGNOSIS — K5900 Constipation, unspecified: Secondary | ICD-10-CM

## 2010-08-07 LAB — POCT GLYCOSYLATED HEMOGLOBIN (HGB A1C): Hemoglobin A1C: 6

## 2010-08-07 LAB — GLUCOSE, CAPILLARY: Glucose-Capillary: 122 mg/dL — ABNORMAL HIGH (ref 70–99)

## 2010-08-07 MED ORDER — FERROUS SULFATE 324 (65 FE) MG PO TBEC: 1.0000 | DELAYED_RELEASE_TABLET | Freq: Every day | ORAL | Status: DC

## 2010-08-07 MED ORDER — AMLODIPINE BESYLATE 5 MG PO TABS: 5.0000 mg | ORAL_TABLET | Freq: Every day | ORAL | Status: DC

## 2010-08-07 MED ORDER — HYDROCODONE-ACETAMINOPHEN 5-500 MG PO TABS: 1.0000 | ORAL_TABLET | Freq: Four times a day (QID) | ORAL | Status: DC | PRN

## 2010-08-07 MED ORDER — ATENOLOL 50 MG PO TABS: 50.0000 mg | ORAL_TABLET | Freq: Every day | ORAL | Status: DC

## 2010-08-07 MED ORDER — OMEPRAZOLE 20 MG PO CPDR: 20.0000 mg | DELAYED_RELEASE_CAPSULE | Freq: Every day | ORAL | Status: DC

## 2010-08-07 MED ORDER — ATORVASTATIN CALCIUM 10 MG PO TABS: 10.0000 mg | ORAL_TABLET | Freq: Every day | ORAL | Status: DC

## 2010-08-07 NOTE — Assessment & Plan Note (Signed)
This is resolved, and the patient has had mild diarrhea since her viral illness. I did tell the patient to hold her Senokot until her stool returns to normal, as I suspect that the Senokot may be related to the patient's diarrhea. I will continue to monitor at this time.

## 2010-08-07 NOTE — Assessment & Plan Note (Signed)
The patients blood pressure was within reasonable control today (BP: 138/78 mmHg ) and I will not make any adjustments to the patients anti-hypertensive regimen. I will continue to monitor and titrate the patients medications as needed at future visits.

## 2010-08-07 NOTE — Patient Instructions (Signed)
Please schedule appointment in 3 months with your regular physician. Please come sooner if needed.

## 2010-08-07 NOTE — Progress Notes (Signed)
Subjective:    Patient ID: Wanda Mclaughlin, female    DOB: 06-22-1928, 77 y.o.   MRN: [id number]  HPI  This is a 77 year old female with a history of hypertension, diabetes, and hyperlipidemia, who presents for one-month followup after seeing Dr. Cena Benton on March 8. At that time, the patient's major concern is constipation for which Senokot was initiated. The patient has been taking this regularly. Approximately one week ago the patient developed a virus, associated with cough, sore throat, and mild occasional diarrhea. This is gradually improved over the last week and the patient has no significant concerns or complaints today. The patient has continued to have some mild diarrhea, though she is continue her Senokot. The patient denies fevers or chills, chest pain, or other systemic symptoms.  Review of Systems  Constitutional: Negative for fever and chills.  Respiratory: Negative for cough and shortness of breath.   Cardiovascular: Negative for chest pain and palpitations.  Gastrointestinal: Negative for vomiting and constipation.       Objective:   Physical Exam  Constitutional: She appears well-developed and well-nourished.  HENT:  Head: Normocephalic and atraumatic.  Eyes: Pupils are equal, round, and reactive to light.  Cardiovascular: Normal rate, regular rhythm and intact distal pulses.  Exam reveals no gallop and no friction rub.   No murmur heard. Pulmonary/Chest: Effort normal and breath sounds normal. She has no wheezes. She has no rales.  Abdominal: Soft. Bowel sounds are normal. She exhibits no distension. There is no tenderness.  Musculoskeletal: Normal range of motion. She exhibits edema.       Trace edema bilaterally in lower extremities.  Neurological: She is alert. No cranial nerve deficit.  Skin: No rash noted.       Current Outpatient Prescriptions on File Prior to Visit  Medication Sig Dispense Refill  . alendronate (FOSAMAX) 70 MG tablet Take 70 mg by mouth every  7 (seven) days. Take with a full glass of water on an empty stomach. Stay sitting or standing for at least 30 minutes after taking the medicine       . Blood Glucose Monitoring Suppl (ACCU-CHEK ADVANTAGE DIABETES) kit Use as instructed  1 each  0  . calcium-vitamin D (OSCAL 500/200 D-3) 500-200 MG-UNIT per tablet Take 1 tablet by mouth 3 (three) times daily.        . fexofenadine (ALLEGRA) 180 MG tablet Take 180 mg by mouth daily.        Marland Kitchen gabapentin (NEURONTIN) 600 MG tablet Take 600 mg by mouth 3 (three) times daily.        Marland Kitchen glucose blood (ACCU-CHEK ADVANTAGE TEST) test strip Use as instructed  100 each  12  . glucose blood (ONE TOUCH TEST STRIPS) test strip Use to test blood sugar 3 times a day as directed       . metFORMIN (GLUCOPHAGE) 1000 MG tablet Take 1,000 mg by mouth 2 (two) times daily.        Marland Kitchen senna (SENOKOT) 8.6 MG tablet Take 2-4 tablets by mouth at bedtime as needed for constipation.  30 tablet  5  . triamterene-hydrochlorothiazide (MAXZIDE-25) 37.5-25 MG per tablet Take 1 tablet by mouth daily.        Marland Kitchen DISCONTD: amLODipine (NORVASC) 5 MG tablet Take 5 mg by mouth daily.        Marland Kitchen DISCONTD: atenolol (TENORMIN) 50 MG tablet Take 50 mg by mouth daily.        Marland Kitchen DISCONTD: atorvastatin (LIPITOR) 10 MG tablet Take  10 mg by mouth daily.        Marland Kitchen DISCONTD: ferrous sulfate 324 (65 FE) MG TBEC Take 1 tablet by mouth daily.        Marland Kitchen DISCONTD: HYDROcodone-acetaminophen (VICODIN) 5-500 MG per tablet Take 1 tablet by mouth every 6 (six) hours as needed.  30 tablet  0  . DISCONTD: omeprazole (PRILOSEC) 20 MG capsule Take 20 mg by mouth daily.          Past Medical History  Diagnosis Date  . Diabetic peripheral neuropathy   . Chronic renal failure   . Chronic knee pain   . Degenerative joint disease   . Hypertension   . Hyperlipidemia   . Diabetes mellitus   . Vertigo   . Duodenitis   . Gastritis   . Anemia     Normal EGG, colonoscopy, and Sm. Bowel F/T 2006  . Osteoporosis       09/2009:  T score -2.8  . Health maintenance examination     Mammogram 10/11: Possible mass, right breast. No evidence of malignancy in left breast. Needle biopsy of left breast 12/11: No evidence of  carcinoma. Patient needs follow-up right breast diagnostic mammogram with ultrasound in 6 months.     Assessment & Plan:

## 2010-08-07 NOTE — Assessment & Plan Note (Signed)
At this point, the patients diabetes is under reasonable control and has meet her goal of A1C <7.  I will make no adjustments in the patients medications today.   I did  Encourage the patient to continue regularly checking their post prandial CBG's, as well as to continue checking their feet for lesions daily.  I also recommended that the patient continue to watch their diet and avoid high glycemic index foods.  The patient denies any barriers to these recommendations.   Lab Results  Component Value Date   HGBA1C 6.0 08/07/2010   HGBA1C 6.2 05/03/2010   HGBA1C 6.3 09/20/2009   Lab Results  Component Value Date   MICROALBUR 0.50 08/09/2008   LDLCALC 92 05/03/2010   CREATININE 1.36* 05/03/2010

## 2010-08-08 LAB — GLUCOSE, CAPILLARY: Glucose-Capillary: 85 mg/dL (ref 70–99)

## 2010-08-24 ENCOUNTER — Other Ambulatory Visit: Payer: Self-pay | Admitting: *Deleted

## 2010-08-24 NOTE — Telephone Encounter (Signed)
Last vicodin refill 4/10 # 30

## 2010-08-27 MED ORDER — HYDROCODONE-ACETAMINOPHEN 5-500 MG PO TABS: 1.0000 | ORAL_TABLET | Freq: Four times a day (QID) | ORAL | Status: DC | PRN

## 2010-08-27 MED ORDER — GABAPENTIN 600 MG PO TABS: 600.0000 mg | ORAL_TABLET | Freq: Three times a day (TID) | ORAL | Status: DC

## 2010-08-28 NOTE — Telephone Encounter (Signed)
Rx faxed in.

## 2010-08-29 ENCOUNTER — Other Ambulatory Visit: Payer: Self-pay | Admitting: Internal Medicine

## 2010-09-08 ENCOUNTER — Encounter: Payer: Self-pay | Admitting: Internal Medicine

## 2010-09-16 ENCOUNTER — Other Ambulatory Visit: Payer: Self-pay | Admitting: Internal Medicine

## 2010-09-20 ENCOUNTER — Other Ambulatory Visit: Payer: Self-pay | Admitting: Internal Medicine

## 2010-09-20 MED ORDER — HYDROCODONE-ACETAMINOPHEN 5-500 MG PO TABS: 1.0000 | ORAL_TABLET | Freq: Four times a day (QID) | ORAL | Status: DC | PRN

## 2010-09-21 NOTE — Telephone Encounter (Signed)
Hydrocodone-Acet 5/500 called to [facility].

## 2010-10-22 ENCOUNTER — Other Ambulatory Visit: Payer: Self-pay | Admitting: *Deleted

## 2010-10-25 MED ORDER — HYDROCODONE-ACETAMINOPHEN 5-500 MG PO TABS: 1.0000 | ORAL_TABLET | Freq: Four times a day (QID) | ORAL | Status: DC | PRN

## 2010-10-25 NOTE — Telephone Encounter (Signed)
Vicodin rx called to [facility] pharmacy. 

## 2010-11-16 ENCOUNTER — Encounter: Payer: Self-pay | Admitting: Internal Medicine

## 2010-11-16 ENCOUNTER — Ambulatory Visit (INDEPENDENT_AMBULATORY_CARE_PROVIDER_SITE_OTHER): Payer: PRIVATE HEALTH INSURANCE | Admitting: Internal Medicine

## 2010-11-16 DIAGNOSIS — Z9889 Other specified postprocedural states: Secondary | ICD-10-CM

## 2010-11-16 DIAGNOSIS — D649 Anemia, unspecified: Secondary | ICD-10-CM

## 2010-11-16 DIAGNOSIS — R609 Edema, unspecified: Secondary | ICD-10-CM

## 2010-11-16 DIAGNOSIS — E785 Hyperlipidemia, unspecified: Secondary | ICD-10-CM

## 2010-11-16 DIAGNOSIS — Z0000 Encounter for general adult medical examination without abnormal findings: Secondary | ICD-10-CM

## 2010-11-16 DIAGNOSIS — I10 Essential (primary) hypertension: Secondary | ICD-10-CM

## 2010-11-16 DIAGNOSIS — E119 Type 2 diabetes mellitus without complications: Secondary | ICD-10-CM

## 2010-11-16 LAB — CBC WITH DIFFERENTIAL/PLATELET
Basophils Relative: 1 % (ref 0–1)
Eosinophils Absolute: 0.2 10*3/uL (ref 0.0–0.7)
MCH: 31.2 pg (ref 26.0–34.0)
MCHC: 32.4 g/dL (ref 30.0–36.0)
Neutrophils Relative %: 59 % (ref 43–77)
Platelets: 218 10*3/uL (ref 150–400)
RDW: 14.3 % (ref 11.5–15.5)

## 2010-11-16 LAB — POCT GLYCOSYLATED HEMOGLOBIN (HGB A1C): Hemoglobin A1C: 6.1

## 2010-11-16 MED ORDER — ASPIRIN EC 81 MG PO TBEC: 81.0000 mg | DELAYED_RELEASE_TABLET | Freq: Every day | ORAL | Status: DC

## 2010-11-16 MED ORDER — AMLODIPINE BESYLATE 10 MG PO TABS: 10.0000 mg | ORAL_TABLET | Freq: Every day | ORAL | Status: DC

## 2010-11-16 NOTE — Patient Instructions (Signed)
1. Will change your medication   Increase Amlodipine to 10 mg po daily starting today.  2. Will check you CBC, CMP, Urine today. 3. Low salt diet and try to lose 5 lbs of your weight. 4. Continue your other medications as scheduled.

## 2010-11-17 LAB — COMPLETE METABOLIC PANEL WITH GFR
ALT: 10 U/L (ref 0–35)
Alkaline Phosphatase: 61 U/L (ref 39–117)
Creat: 1.38 mg/dL — ABNORMAL HIGH (ref 0.50–1.10)
GFR, Est Non African American: 37 mL/min — ABNORMAL LOW (ref >60)
Glucose, Bld: 94 mg/dL (ref 70–99)
Sodium: 139 mEq/L (ref 135–145)
Total Bilirubin: 0.4 mg/dL (ref 0.3–1.2)
Total Protein: 8 g/dL (ref 6.0–8.3)

## 2010-11-17 LAB — MICROALBUMIN / CREATININE URINE RATIO
Creatinine, Urine: 98.5 mg/dL
Microalb Creat Ratio: 10.2 mg/g (ref 0.0–30.0)

## 2010-11-17 LAB — FERRITIN: Ferritin: 424 ng/mL — ABNORMAL HIGH (ref 10–291)

## 2010-11-18 NOTE — Assessment & Plan Note (Signed)
Pt is all uptodate on her health maintenance.  - she needs follow up right breast diagnostic mammogram with ultrasound.

## 2010-11-18 NOTE — Assessment & Plan Note (Signed)
Lipid is fairly well controlled. - continue the current regimen.

## 2010-11-18 NOTE — Assessment & Plan Note (Addendum)
Pt BP 150s/80s. Pt is currently on Norvasc, Atenolol, Maxzide for BP control I have discussed with pt about her salt control and weight loss. -will increase Norvasc dosage to 10 mg daily -pt is instructed that Norvasc side effects, and pt understand that her ankle swelling may get worse. If she is unable to tolerate higher dose of Norvasc.  I will consider to add ACEI lisinopril with close monitoring of her renal function and K level.

## 2010-11-18 NOTE — Progress Notes (Signed)
  Subjective:    Patient ID: Wanda Mclaughlin, female    DOB: 05-20-1928, 77 y.o.   MRN: [id number]  HPI This is a 77 yo pleasant lady with PMH of DMII, HTN, mild CRI and chronic ankle edema presents to the clinic with c/o worsening of ankle edema.  Pt states that she has noticed increased ankle edema earlier this week. Denies fever, leg pain or redness. Denies recent travel. No c/o SOB or CP. Pt has a chronic h/o trace ankle edema. She states that she usually cooks for herself, however, she has been eating canned soup and TV dinner this week. Pt reports that her ankle edema has subsided past couple of days without any additional treatment.  Pt is very compliant with her DM management. She bought her meter today.   Review of Systems  No headache, fever, or sore throat. No shortness of breath or dyspnea on exertion. No chest pain, chest pressure or palpitation.  No nausea, vomiting, or abdominal pain. No melena, diarrhea or incontinence. No muscle weakness.                    Denies depression. No appetite or weight changes.      Objective:   Physical Exam General: alert, well-developed, and cooperative to examination.  Mouth: pharynx pink and moist, no erythema, and no exudates.  Lungs: normal respiratory effort, no accessory muscle use, normal breath sounds, no crackles, and no wheezes. Heart: normal rate, regular rhythm, no murmur, no gallop, and no rub.  Abdomen: soft, non-tender, normal bowel sounds, no distention, no guarding, no rebound tenderness, no hepatomegaly, and no splenomegaly.  Msk: no joint swelling, no joint warmth, and no redness over joints.  Pulses: 2+ DP/PT pulses bilaterally Extremities: No cyanosis or clubbing. Bilateral lower extremities trace edema. Neurologic: alert & oriented X3, cranial nerves II-XII intact, strength normal in all extremities, sensation intact to light touch, and gait normal.  Skin: turgor normal and no rashes.  Psych: Oriented X3, memory  intact for recent and remote, normally interactive, good eye contact, not anxious appearing, and not depressed appearing.         Assessment & Plan:

## 2010-11-18 NOTE — Assessment & Plan Note (Signed)
No acute c/o - will check her CBC and ferritin

## 2010-11-18 NOTE — Assessment & Plan Note (Addendum)
Pt's DM is well controlled with HbA1C 6.1 and BG 97 at the clinic today. She brought her meter with average BG 114 (ranging between 97-159).  Pt has had her ophthalmology appointment last month with Dr. Hyacinth Meeker, [facility] Most. And she was told everything was fine. Pt states that she continues to check her feet daily. - will request the medical records from Dr. Rondel Baton office. - Will monitor her renal function since she is on Metformin. Will check CMP. -Will check her urine microalbumin/Cr ratio

## 2010-11-18 NOTE — Assessment & Plan Note (Signed)
Chronic h/o ankle edema. Pt states that she has had increased ankle edema earlier this week which is better now. She reports that she has been eating canned soup and TV dinner this week. Trace edema noted on bilateral lower leg. No ankle edema noted at present. - instructed her decrease salt intake and avoid canned soup and TV dinners. - will monitor her ankle edema.

## 2010-11-19 ENCOUNTER — Telehealth: Payer: Self-pay | Admitting: Internal Medicine

## 2010-11-19 NOTE — Telephone Encounter (Signed)
I have ordered follow up right breast diagnostic mammogram and ultrasound for her.  I called pt and left a message telling her that she would have this test ordered and the clinic nurse will notify her the appointment date and time.

## 2010-11-19 NOTE — Progress Notes (Signed)
Addended by: Maura Crandall on: 11/19/2010 09:23 AM   Modules accepted: Orders

## 2010-11-19 NOTE — Progress Notes (Signed)
I saw patient and discussed her care with resident Dr. Li.  I agree with the clinical findings and plans as outlined in her note.  

## 2010-11-19 NOTE — Progress Notes (Signed)
Addended byDierdre Searles, Egidio Lofgren on: 11/19/2010 09:44 AM   Modules accepted: Orders

## 2010-11-26 ENCOUNTER — Ambulatory Visit
Admission: RE | Admit: 2010-11-26 | Discharge: 2010-11-26 | Disposition: A | Discharge status: Home / Self Care | Payer: PRIVATE HEALTH INSURANCE | Source: Ambulatory Visit | Attending: Internal Medicine | Admitting: Internal Medicine

## 2010-11-26 DIAGNOSIS — Z9889 Other specified postprocedural states: Secondary | ICD-10-CM

## 2010-11-29 ENCOUNTER — Other Ambulatory Visit: Payer: Self-pay | Admitting: Internal Medicine

## 2010-11-29 ENCOUNTER — Other Ambulatory Visit: Payer: Self-pay | Admitting: *Deleted

## 2010-11-29 NOTE — Telephone Encounter (Signed)
Pt informed through pharmacy 

## 2010-11-29 NOTE — Telephone Encounter (Signed)
Last filled 6/28

## 2010-12-10 ENCOUNTER — Ambulatory Visit (INDEPENDENT_AMBULATORY_CARE_PROVIDER_SITE_OTHER): Payer: PRIVATE HEALTH INSURANCE | Admitting: Internal Medicine

## 2010-12-10 ENCOUNTER — Encounter: Payer: Self-pay | Admitting: Internal Medicine

## 2010-12-10 VITALS — BP 140/74 | HR 65 | Temp 98.0°F | Wt 189.6 lb

## 2010-12-10 DIAGNOSIS — I10 Essential (primary) hypertension: Secondary | ICD-10-CM

## 2010-12-10 MED ORDER — OXYCODONE-ACETAMINOPHEN 7.5-500 MG PO TABS: 1.0000 | ORAL_TABLET | ORAL | Status: DC | PRN

## 2010-12-10 NOTE — Progress Notes (Signed)
  Subjective:    Patient ID: Wanda Mclaughlin, female    DOB: 1928/12/14, 77 y.o.   MRN: [id number]  HPI Patient is 77 year old female with past medical history outlined below who presents to clinic for followup on blood pressure. Approximately 3 weeks ago she was here in the clinic and blood pressure reading was greater than 160/100 and she was advised to come back in 2 weeks. She reports compliance with recommended medication, diet, exercise, and reports also checking blood pressure regularly at home. She reports that numbers are usually less than 140/90. She denies recent sicknesses or hospitalizations, no abscesses has been shortness of breath, no abdominal urinary concerns.   Review of Systems Constitutional: Denies fever, chills, diaphoresis, appetite change and fatigue.  HEENT: Denies photophobia, eye pain, redness, hearing loss, ear pain, congestion, sore throat, rhinorrhea, sneezing, mouth sores, trouble swallowing, neck pain, neck stiffness and tinnitus.   Respiratory: Denies SOB, DOE, cough, chest tightness,  and wheezing.   Cardiovascular: Denies chest pain, palpitations and leg swelling.  Gastrointestinal: Denies nausea, vomiting, abdominal pain, diarrhea, constipation, blood in stool and abdominal distention.  Genitourinary: Denies dysuria, urgency, frequency, hematuria, flank pain and difficulty urinating.  Musculoskeletal: Denies myalgias, back pain, joint swelling, arthralgias and gait problem.  Skin: Denies pallor, rash and wound.  Neurological: Denies dizziness, seizures, syncope, weakness, light-headedness, numbness and headaches.  Hematological: Denies adenopathy. Easy bruising, personal or family bleeding history  Psychiatric/Behavioral: Denies suicidal ideation, mood changes, confusion, nervousness, sleep disturbance and agitation      Objective:   Physical Exam Constitutional: Vital signs reviewed.  Patient is a well-developed and well-nourished in no acute distress and  cooperative with exam. Alert and oriented x3.  Cardiovascular: RRR, S1 normal, S2 normal, no MRG, pulses symmetric and intact bilaterally Pulmonary/Chest: CTAB, no wheezes, rales, or rhonchi Abdominal: Soft. Non-tender, non-distended, bowel sounds are normal, no masses, organomegaly, or guarding present.  Psychiatric: Normal mood and affect. speech and behavior is normal. Judgment and thought content normal. Cognition and memory are normal.          Assessment & Plan:

## 2010-12-10 NOTE — Assessment & Plan Note (Signed)
Blood pressure improved compared to last visit. I have rechecked blood pressure during the visit and second reading was 127/72. I have encouraged continued compliance with medications as well as recommended diet and exercise. Electrodes were checked on the last visit and were at patient's baseline.

## 2010-12-19 ENCOUNTER — Other Ambulatory Visit: Payer: Self-pay | Admitting: Internal Medicine

## 2010-12-20 ENCOUNTER — Encounter: Payer: PRIVATE HEALTH INSURANCE | Admitting: Internal Medicine

## 2011-01-05 ENCOUNTER — Other Ambulatory Visit: Payer: Self-pay | Admitting: Internal Medicine

## 2011-01-10 ENCOUNTER — Encounter: Payer: PRIVATE HEALTH INSURANCE | Admitting: Internal Medicine

## 2011-01-16 ENCOUNTER — Other Ambulatory Visit: Payer: Self-pay | Admitting: Internal Medicine

## 2011-01-16 NOTE — Telephone Encounter (Signed)
Pls sch appt Dr Bosie Clos around Nov. Thanks

## 2011-01-16 NOTE — Telephone Encounter (Signed)
Message sent to front desk for an appt. 

## 2011-01-22 LAB — POCT I-STAT, CHEM 8
BUN: 21
Calcium, Ion: 1.16
Chloride: 109
Creatinine, Ser: 1.4 — ABNORMAL HIGH
Glucose, Bld: 126 — ABNORMAL HIGH
HCT: 34 — ABNORMAL LOW
Hemoglobin: 11.6 — ABNORMAL LOW
Potassium: 4.1
Sodium: 141
TCO2: 22

## 2011-01-22 LAB — CBC
HCT: 32.8 — ABNORMAL LOW
Platelets: 177
RDW: 12.9

## 2011-01-22 LAB — DIFFERENTIAL
Basophils Absolute: 0
Eosinophils Relative: 4
Lymphocytes Relative: 21
Neutro Abs: 3.6
Neutrophils Relative %: 69

## 2011-02-06 ENCOUNTER — Other Ambulatory Visit: Payer: Self-pay | Admitting: *Deleted

## 2011-02-06 ENCOUNTER — Ambulatory Visit (INDEPENDENT_AMBULATORY_CARE_PROVIDER_SITE_OTHER): Payer: PRIVATE HEALTH INSURANCE | Admitting: Internal Medicine

## 2011-02-06 ENCOUNTER — Encounter: Payer: Self-pay | Admitting: Internal Medicine

## 2011-02-06 ENCOUNTER — Ambulatory Visit (HOSPITAL_COMMUNITY)
Admission: RE | Admit: 2011-02-06 | Discharge: 2011-02-06 | Disposition: A | Discharge status: Home / Self Care | Payer: PRIVATE HEALTH INSURANCE | Source: Ambulatory Visit | Attending: Internal Medicine | Admitting: Internal Medicine

## 2011-02-06 ENCOUNTER — Other Ambulatory Visit: Payer: Self-pay

## 2011-02-06 DIAGNOSIS — Z23 Encounter for immunization: Secondary | ICD-10-CM

## 2011-02-06 DIAGNOSIS — I10 Essential (primary) hypertension: Secondary | ICD-10-CM

## 2011-02-06 DIAGNOSIS — R002 Palpitations: Secondary | ICD-10-CM

## 2011-02-06 DIAGNOSIS — E119 Type 2 diabetes mellitus without complications: Secondary | ICD-10-CM

## 2011-02-06 DIAGNOSIS — R9431 Abnormal electrocardiogram [ECG] [EKG]: Secondary | ICD-10-CM | POA: Insufficient documentation

## 2011-02-06 DIAGNOSIS — D649 Anemia, unspecified: Secondary | ICD-10-CM

## 2011-02-06 DIAGNOSIS — I517 Cardiomegaly: Secondary | ICD-10-CM | POA: Insufficient documentation

## 2011-02-06 DIAGNOSIS — R0602 Shortness of breath: Secondary | ICD-10-CM | POA: Insufficient documentation

## 2011-02-06 LAB — GLUCOSE, CAPILLARY: Glucose-Capillary: 91 mg/dL (ref 70–99)

## 2011-02-06 LAB — POCT GLYCOSYLATED HEMOGLOBIN (HGB A1C): Hemoglobin A1C: 6.3

## 2011-02-06 MED ORDER — OXYCODONE-ACETAMINOPHEN 7.5-500 MG PO TABS: 1.0000 | ORAL_TABLET | ORAL | Status: DC | PRN

## 2011-02-06 NOTE — Assessment & Plan Note (Signed)
Hgb and ferritin check in July 2012 stable.  No complaints of bleeds, light-headness or weakness.  Recent colonoscopy normal without evidence of GI bleed to account for shortness of breath or palpitations.  Will consider repeat CBC at next visit.

## 2011-02-06 NOTE — Assessment & Plan Note (Signed)
Very short duration of palpitations which patient reports as "ping" which lasts for seconds less worrisome for myocardial ischemia. In addition, the EKG was without significant changes from prior in Dec 2011.  She denies any history of thyroid problems, anxiety, leg pain or long trips to suggest a PE.  Her Hgb was at her baseline at last visit in July (hgb=11) and ferritin demonstrated adequate supplementation (>300).  We will continue to monitor with cautionary instructions given to patients about symptoms.  She will keep her scheduled appointment Nov 5th.  If symptoms persist, will consider further workup including repeat CBC and TSH.

## 2011-02-06 NOTE — Telephone Encounter (Signed)
Rx given to pt. 

## 2011-02-06 NOTE — Assessment & Plan Note (Signed)
Well controlled with HgbA1C 6.1 and capillary glucose 121 today.  Will continue current therapy and f/u as scheduled.

## 2011-02-06 NOTE — Progress Notes (Signed)
[name] history and physical examination were reviewed with Dr. Bosie Clos and the assessment and plan were formulated together.  Chest pain lasting only seconds is very unlikely to represent myocardial ischemia.  Agree with observing given negative ECG and examination.  Follow-up with Dr. Bosie Clos in less than 1 month as already scheduled.

## 2011-02-06 NOTE — Patient Instructions (Signed)
Wanda Mclaughlin, it was nice to meet you and your daughter today.  If the shortness of breath doesn't improve with rest and the "pings" in your chest start to last for longer periods of time or you start to feel chest pressure, become sweaty, light-headed or anything that makes you concerned, please call the clinic for assistance at [phone].  The EKG of your heart does not show much change from the last one that you had and does not show signs that you are having irregular heart beats or a heart attack.  Based on your physical exam today, you also do not appear to have heart failure.  We will continue your medications as usual and I will see you on November 5th.   Dr. Kristie Cowman

## 2011-02-06 NOTE — Telephone Encounter (Signed)
Last received 8/13

## 2011-02-06 NOTE — Assessment & Plan Note (Signed)
Etiology is unclear.  On exam patient appeared comfortable, speaking in full sentences without distress.  The has no history of CHF and lungs are clear with only trace lower extremity edema.  She denies sick contacts, cold like symptoms,fever, sob while at rest or sustained chest pain to suggest pneumonia or pulmonary embolism.  Patient advised to monitor her symptoms and if worsens with frequency, duration or intensity to call the clinic for further guidance or go to the ED.

## 2011-02-06 NOTE — Assessment & Plan Note (Signed)
Wanda Mclaughlin has had elevated bp at her most recent office visit requiring a recheck.  She is currently on Norvasc 10 mg, Atenolol 50 mg qd and Maxzide 37.5/25.  There have been issues with compliance but patient reports that she is taking all listed medications. Today bp is 150/78.  I will defer medicine changes at this time and re-assess at her f/u Nov 5th with me.

## 2011-02-06 NOTE — Progress Notes (Signed)
  Subjective:    Patient ID: Wanda Mclaughlin, female    DOB: 1928/07/19, 77 y.o.   MRN: [id number]  HPI  Wanda Mclaughlin presents today with her daughter with complaints of shortness of breath when walking and intermittent chest palpitations for the past 2-3 days. This happened most recently last night.  She describes it as a "pinging" pain that lasts for seconds, it does not radiate, not associated with diaphoresis, nausea, vomiting or abdominal pain. She reports that she has been taking her all medications as prescribed and denies history thyroid problems, increased leg edema, prior heart attacks or strokes, recent travel, sick contacts or stress.  Review of Systems As per HPI, otherwise negative    Objective:   Physical Exam  Vitals: reviewed and noted Gen: well-nourished, well-developed, elderly lady in no acute distress,  with daughter at bedside HEENT: normocephalic, atraumatic, PERRLA, EOMI, neck supple, no JVD, no carotid bruits, TMs visualized and normal bilaterally,  no rhinorrhea, oropharynx without exudates or erythema, no cervical lymphadenopathy Chest: heart regular rate and rhythm, normal S1 and S2, lungs clear to auscultation bilaterally from apices to bases Abdomen: obese, normoactive bowel sounds, nontender, no masses or hepatosplenomegaly appreciated Extremities: distal pulses intact, trace lower extremity edema Neuro: alert and oriented x3, nonfocal Psych: no abnormalities of mood or thought, conversive, normal judgement and insight      Assessment & Plan:  Please see problem list

## 2011-03-04 ENCOUNTER — Ambulatory Visit (INDEPENDENT_AMBULATORY_CARE_PROVIDER_SITE_OTHER): Payer: PRIVATE HEALTH INSURANCE | Admitting: Internal Medicine

## 2011-03-04 ENCOUNTER — Encounter: Payer: Self-pay | Admitting: Internal Medicine

## 2011-03-04 VITALS — BP 125/66 | HR 66 | Temp 97.6°F | Ht 63.0 in | Wt 188.0 lb

## 2011-03-04 DIAGNOSIS — N182 Chronic kidney disease, stage 2 (mild): Secondary | ICD-10-CM

## 2011-03-04 DIAGNOSIS — E119 Type 2 diabetes mellitus without complications: Secondary | ICD-10-CM

## 2011-03-04 DIAGNOSIS — E785 Hyperlipidemia, unspecified: Secondary | ICD-10-CM

## 2011-03-04 DIAGNOSIS — M1990 Unspecified osteoarthritis, unspecified site: Secondary | ICD-10-CM

## 2011-03-04 DIAGNOSIS — M25569 Pain in unspecified knee: Secondary | ICD-10-CM

## 2011-03-04 DIAGNOSIS — D649 Anemia, unspecified: Secondary | ICD-10-CM

## 2011-03-04 DIAGNOSIS — I10 Essential (primary) hypertension: Secondary | ICD-10-CM

## 2011-03-04 LAB — GLUCOSE, CAPILLARY: Glucose-Capillary: 82 mg/dL (ref 70–99)

## 2011-03-04 LAB — LIPID PANEL
Cholesterol: 155 mg/dL (ref 0–200)
Triglycerides: 105 mg/dL (ref <150)

## 2011-03-04 MED ORDER — OXYCODONE-ACETAMINOPHEN 7.5-500 MG PO TABS: 1.0000 | ORAL_TABLET | ORAL | Status: DC | PRN

## 2011-03-04 NOTE — Assessment & Plan Note (Signed)
Likely secondary to hypertensive disease given normal microalbumin level.  Will continue to monitor.

## 2011-03-04 NOTE — Assessment & Plan Note (Signed)
Stable on Oxycodone-Acetaminophen.  Will refill today. #120 pills

## 2011-03-04 NOTE — Assessment & Plan Note (Signed)
Without complaints of fatigue today.  Will continue iron therapy with ferrous sulfate.  Last Hgb = 10.9 (4 months ago)

## 2011-03-04 NOTE — Assessment & Plan Note (Signed)
Under good control with HgbA1c = 6.3 in October 2012.  Home meter readings are within target goals on Metformin 1000mg  daily.  Will check HgbA1c at next visit in 4  months along with diabetic foot exam (last in Jan 2012).

## 2011-03-04 NOTE — Assessment & Plan Note (Addendum)
BP under better control today 125/66 on Norvasc 10 mg daily, atenolol 50 daily and Maxzide 37.5/25 mg daily.  Will continue current therapy and continue to monitor.

## 2011-03-04 NOTE — Patient Instructions (Addendum)
It was nice to see you today. Your blood pressure reading shows adequate control at 125/66 today and your glucose meter shows that you have been controlling your blood sugar very well. We will check your cholesterol today. Please continue to take your medications as prescribed and return to clinic in 4 months.

## 2011-03-04 NOTE — Assessment & Plan Note (Signed)
Continued discomfort.  Steroid injection deferred at this point.  Patient reports that relief from injection lasts "about a week".  Will continue to monitor and re-address on next visit the benefit of steroid versus oral pain medication.

## 2011-03-04 NOTE — Progress Notes (Signed)
  Subjective:    Patient ID: Wanda Mclaughlin, female    DOB: January 31, 1929, 77 y.o.   MRN: [id number]  HPI Wanda Mclaughlin presents to clinic for f/u of hypertension, diabetes, and degenerative joint disease. Patient states that she is doing "pretty good".  She is without specific complains and reports resolution of heart palpitations "pings" which she experienced in October.  She is inquiring about another steroid shot to her left knee for which she takes oxycodone-acetaminophen.  She denies chest pain, shortness of breath, or cold-like symptoms.  She received the flu shot in October.  Review of Systems    as per HPI Objective:   Physical Exam  Constitutional: She is oriented to person, place, and time. She appears well-developed and well-nourished. No distress.  HENT:  Head: Normocephalic and atraumatic.       Left hearring aid in EAM   Eyes: EOM are normal. Pupils are equal, round, and reactive to light.  Neck: Neck supple. No JVD present. Carotid bruit is not present. No edema present. No thyromegaly present.  Cardiovascular: Normal rate, regular rhythm and intact distal pulses.   Pulmonary/Chest: Effort normal and breath sounds normal.  Abdominal: Soft. Normal appearance and bowel sounds are normal. There is no tenderness.  Musculoskeletal:       Right knee: She exhibits decreased range of motion.       Left knee: She exhibits decreased range of motion.  Neurological: She is alert and oriented to person, place, and time.          Assessment & Plan:

## 2011-03-04 NOTE — Assessment & Plan Note (Signed)
Will check Lipid Panel today to assess effectiveness of anti- lipid therapy.  Currently on 10 mg Atorvastatin daily.  Pt without complaints of muscle aches or fatigue.

## 2011-04-15 ENCOUNTER — Other Ambulatory Visit: Payer: Self-pay | Admitting: Internal Medicine

## 2011-04-15 ENCOUNTER — Other Ambulatory Visit: Payer: Self-pay | Admitting: *Deleted

## 2011-04-15 DIAGNOSIS — M1990 Unspecified osteoarthritis, unspecified site: Secondary | ICD-10-CM

## 2011-04-15 DIAGNOSIS — M25569 Pain in unspecified knee: Secondary | ICD-10-CM

## 2011-04-15 MED ORDER — OXYCODONE-ACETAMINOPHEN 7.5-500 MG PO TABS: 1.0000 | ORAL_TABLET | ORAL | Status: DC | PRN

## 2011-04-15 NOTE — Telephone Encounter (Signed)
Reviewed FYI and last refill date. Refill appropriate.

## 2011-04-19 ENCOUNTER — Other Ambulatory Visit: Payer: Self-pay | Admitting: *Deleted

## 2011-04-20 MED ORDER — METFORMIN HCL 1000 MG PO TABS: 1000.0000 mg | ORAL_TABLET | Freq: Two times a day (BID) | ORAL | Status: DC

## 2011-05-09 ENCOUNTER — Other Ambulatory Visit: Payer: Self-pay | Admitting: Internal Medicine

## 2011-05-11 ENCOUNTER — Other Ambulatory Visit: Payer: Self-pay | Admitting: Internal Medicine

## 2011-05-13 ENCOUNTER — Other Ambulatory Visit: Payer: Self-pay | Admitting: *Deleted

## 2011-05-13 DIAGNOSIS — M25569 Pain in unspecified knee: Secondary | ICD-10-CM

## 2011-05-13 DIAGNOSIS — M1990 Unspecified osteoarthritis, unspecified site: Secondary | ICD-10-CM

## 2011-05-13 NOTE — Telephone Encounter (Signed)
Call when ready [phone]

## 2011-05-17 MED ORDER — OXYCODONE-ACETAMINOPHEN 7.5-500 MG PO TABS: 1.0000 | ORAL_TABLET | ORAL | Status: DC | PRN

## 2011-05-22 ENCOUNTER — Other Ambulatory Visit: Payer: Self-pay | Admitting: Internal Medicine

## 2011-05-22 DIAGNOSIS — M25569 Pain in unspecified knee: Secondary | ICD-10-CM

## 2011-05-22 DIAGNOSIS — M1990 Unspecified osteoarthritis, unspecified site: Secondary | ICD-10-CM

## 2011-05-22 MED ORDER — OXYCODONE-ACETAMINOPHEN 7.5-500 MG PO TABS: 1.0000 | ORAL_TABLET | ORAL | Status: DC | PRN

## 2011-05-23 ENCOUNTER — Other Ambulatory Visit: Payer: Self-pay | Admitting: *Deleted

## 2011-05-23 NOTE — Progress Notes (Signed)
Triage has attempted to reach pt/ daughter 4 times in last few days, have left message for rtc

## 2011-05-24 MED ORDER — TRIAMTERENE-HCTZ 37.5-25 MG PO TABS: 1.0000 | ORAL_TABLET | Freq: Every day | ORAL | Status: DC

## 2011-05-24 NOTE — Telephone Encounter (Signed)
Needs [facility]Arch appt with PCP. If none available, then try for April.

## 2011-05-24 NOTE — Telephone Encounter (Signed)
Message sent to front desk for an appt. 

## 2011-06-03 ENCOUNTER — Emergency Department (HOSPITAL_COMMUNITY): Payer: PRIVATE HEALTH INSURANCE

## 2011-06-03 ENCOUNTER — Emergency Department (HOSPITAL_COMMUNITY)
Admission: EM | Admit: 2011-06-03 | Discharge: 2011-06-03 | Disposition: A | Discharge status: Home / Self Care | Payer: PRIVATE HEALTH INSURANCE | Attending: Emergency Medicine | Admitting: Emergency Medicine

## 2011-06-03 ENCOUNTER — Encounter (HOSPITAL_COMMUNITY): Payer: Self-pay

## 2011-06-03 ENCOUNTER — Other Ambulatory Visit: Payer: Self-pay

## 2011-06-03 DIAGNOSIS — R51 Headache: Secondary | ICD-10-CM | POA: Insufficient documentation

## 2011-06-03 DIAGNOSIS — R531 Weakness: Secondary | ICD-10-CM

## 2011-06-03 DIAGNOSIS — R197 Diarrhea, unspecified: Secondary | ICD-10-CM | POA: Insufficient documentation

## 2011-06-03 DIAGNOSIS — R1110 Vomiting, unspecified: Secondary | ICD-10-CM | POA: Insufficient documentation

## 2011-06-03 DIAGNOSIS — E119 Type 2 diabetes mellitus without complications: Secondary | ICD-10-CM | POA: Insufficient documentation

## 2011-06-03 DIAGNOSIS — R279 Unspecified lack of coordination: Secondary | ICD-10-CM | POA: Insufficient documentation

## 2011-06-03 DIAGNOSIS — IMO0001 Reserved for inherently not codable concepts without codable children: Secondary | ICD-10-CM | POA: Insufficient documentation

## 2011-06-03 DIAGNOSIS — M810 Age-related osteoporosis without current pathological fracture: Secondary | ICD-10-CM | POA: Insufficient documentation

## 2011-06-03 DIAGNOSIS — E785 Hyperlipidemia, unspecified: Secondary | ICD-10-CM | POA: Insufficient documentation

## 2011-06-03 DIAGNOSIS — I10 Essential (primary) hypertension: Secondary | ICD-10-CM | POA: Insufficient documentation

## 2011-06-03 DIAGNOSIS — R5381 Other malaise: Secondary | ICD-10-CM | POA: Insufficient documentation

## 2011-06-03 LAB — CBC
HCT: 39.6 % (ref 36.0–46.0)
Hemoglobin: 13.3 g/dL (ref 12.0–15.0)
MCV: 94.7 fL (ref 78.0–100.0)
RBC: 4.18 MIL/uL (ref 3.87–5.11)
WBC: 6.3 10*3/uL (ref 4.0–10.5)

## 2011-06-03 LAB — COMPREHENSIVE METABOLIC PANEL
ALT: 11 U/L (ref 0–35)
CO2: 26 mEq/L (ref 19–32)
Calcium: 10.7 mg/dL — ABNORMAL HIGH (ref 8.4–10.5)
Creatinine, Ser: 1.29 mg/dL — ABNORMAL HIGH (ref 0.50–1.10)
GFR calc Af Amer: 44 mL/min — ABNORMAL LOW (ref >90)
GFR calc non Af Amer: 38 mL/min — ABNORMAL LOW (ref >90)
Glucose, Bld: 128 mg/dL — ABNORMAL HIGH (ref 70–99)
Sodium: 140 mEq/L (ref 135–145)

## 2011-06-03 LAB — DIFFERENTIAL
Eosinophils Relative: 0 % (ref 0–5)
Lymphocytes Relative: 20 % (ref 12–46)
Lymphs Abs: 1.2 10*3/uL (ref 0.7–4.0)
Monocytes Absolute: 0.4 10*3/uL (ref 0.1–1.0)

## 2011-06-03 LAB — URINALYSIS, ROUTINE W REFLEX MICROSCOPIC
Bilirubin Urine: NEGATIVE
Glucose, UA: NEGATIVE mg/dL
Leukocytes, UA: NEGATIVE
Nitrite: NEGATIVE
Specific Gravity, Urine: 1.017 (ref 1.005–1.030)
pH: 6 (ref 5.0–8.0)

## 2011-06-03 LAB — URINE MICROSCOPIC-ADD ON

## 2011-06-03 LAB — POCT I-STAT TROPONIN I

## 2011-06-03 MED ORDER — ONDANSETRON 8 MG PO TBDP: 8.0000 mg | ORAL_TABLET | Freq: Three times a day (TID) | ORAL | Status: AC | PRN

## 2011-06-03 MED ORDER — SODIUM CHLORIDE 0.9 % IV BOLUS (SEPSIS)
500.0000 mL | Freq: Once | INTRAVENOUS | Status: AC
  Administered 2011-06-03: 500 mL via INTRAVENOUS

## 2011-06-03 MED ORDER — ONDANSETRON 4 MG PO TBDP: 4.0000 mg | ORAL_TABLET | Freq: Once | ORAL | Status: DC

## 2011-06-03 NOTE — ED Notes (Signed)
Ambulated patient in hallway with stand-by assist. Pt did well with use of cane from home.

## 2011-06-03 NOTE — ED Notes (Signed)
Pt complains of not feeling well, not able to walk, vomiting onset fridya.

## 2011-06-03 NOTE — Discharge Instructions (Signed)
Weakness, Generalized Without Cause Your caregiver has seen you today because you are having problems with feelings of weakness. Weakness has many different causes, some of which are common and others are very rare. The causes of weakness are so numerous they could not all be listed on this page. The exam and other tests done today do not reveal a specific cause for the weakness that is an immediate danger or something that is treatable. Your caregiver has checked you for the most common causes of weakness and feels it is safe for you to go home and be observed. HOME CARE INSTRUCTIONS   For the time being, obtain more rest if needed.   Eat a well balanced diet.   Try to get at least some exercise every day in spite of how difficult it may seem at times. In the case of the elderly, exercise is especially important. As we grow older, there is a loss of muscle mass. Generally, there is also a loss of, or decrease in, activity that comes naturally with the aging process. Exercise and increased activities are the only tools we have to combat this natural process.   The results of some tests ordered today may not be available right away. You will be contacted with those results when they become available.   It is important to follow through with your physician as per instructions that you may have received today.  SEEK MEDICAL CARE IF:   You have any new concerns which you do not feel were dealt with today.   The weakness seems to be getting progressively worse.   You develop new or unusual aches or pains.  SEEK IMMEDIATE MEDICAL CARE IF:   You are unable to tend to your usual daily activities such as simply getting dressed, feeding yourself, or keeping up with your personal hygiene.   You develop inability to walk stairs or perform your usual daily activities.   You develop shortness of breath, chest pain, have difficulty moving parts of your body, or develop new problems for which you have not  talked to your caregiver.   You experience difficulty speaking or swallowing.   You develop loss of control of bladder or bowels that was not present before.  Document Released: 04/15/2005 Document Revised: 12/26/2010 Document Reviewed: 09/25/2006 [facility] Patient Information 2012 [place], [place].   Fatigue Fatigue is a feeling of tiredness, lack of energy, lack of motivation, or feeling tired all the time. Having enough rest, good nutrition, and reducing stress will normally reduce fatigue. Consult your caregiver if it persists. The nature of your fatigue will help your caregiver to find out its cause. The treatment is based on the cause.  CAUSES  There are many causes for fatigue. Most of the time, fatigue can be traced to one or more of your habits or routines. Most causes fit into one or more of three general areas. They are: Lifestyle problems  Sleep disturbances.   Overwork.   Physical exertion.   Unhealthy habits.   Poor eating habits or eating disorders.   Alcohol and/or drug use .   Lack of proper nutrition (malnutrition).  Psychological problems  Stress and/or anxiety problems.   Depression.   Grief.   Boredom.  Medical Problems or Conditions  Anemia.   Pregnancy.   Thyroid gland problems.   Recovery from major surgery.   Continuous pain.   Emphysema or asthma that is not well controlled   Allergic conditions.   Diabetes.   Infections (such  as mononucleosis).   Obesity.   Sleep disorders, such as sleep apnea.   Heart failure or other heart-related problems.   Cancer.   Kidney disease.   Liver disease.   Effects of certain medicines such as antihistamines, cough and cold remedies, prescription pain medicines, heart and blood pressure medicines, drugs used for treatment of cancer, and some antidepressants.  SYMPTOMS  The symptoms of fatigue include:   Lack of energy.   Lack of drive (motivation).   Drowsiness.   Feeling of  indifference to the surroundings.  DIAGNOSIS  The details of how you feel help guide your caregiver in finding out what is causing the fatigue. You will be asked about your present and past health condition. It is important to review all medicines that you take, including prescription and non-prescription items. A thorough exam will be done. You will be questioned about your feelings, habits, and normal lifestyle. Your caregiver may suggest blood tests, urine tests, or other tests to look for common medical causes of fatigue.  TREATMENT  Fatigue is treated by correcting the underlying cause. For example, if you have continuous pain or depression, treating these causes will improve how you feel. Similarly, adjusting the dose of certain medicines will help in reducing fatigue.  HOME CARE INSTRUCTIONS   Try to get the required amount of good sleep every night.   Eat a healthy and nutritious diet, and drink enough water throughout the day.   Practice ways of relaxing (including yoga or meditation).   Exercise regularly.   Make plans to change situations that cause stress. Act on those plans so that stresses decrease over time. Keep your work and personal routine reasonable.   Avoid street drugs and minimize use of alcohol.   Start taking a daily multivitamin after consulting your caregiver.  SEEK MEDICAL CARE IF:   You have persistent tiredness, which cannot be accounted for.   You have fever.   You have unintentional weight loss.   You have headaches.   You have disturbed sleep throughout the night.   You are feeling sad.   You have constipation.   You have dry skin.   You have gained weight.   You are taking any new or different medicines that you suspect are causing fatigue.   You are unable to sleep at night.   You develop any unusual swelling of your legs or other parts of your body.  SEEK IMMEDIATE MEDICAL CARE IF:   You are feeling confused.   Your vision is  blurred.   You feel faint or pass out.   You develop severe headache.   You develop severe abdominal, pelvic, or back pain.   You develop chest pain, shortness of breath, or an irregular or fast heartbeat.   You are unable to pass a normal amount of urine.   You develop abnormal bleeding such as bleeding from the rectum or you vomit blood.   You have thoughts about harming yourself or committing suicide.   You are worried that you might harm someone else.  MAKE SURE YOU:   Understand these instructions.   Will watch your condition.   Will get help right away if you are not doing well or get worse.  Document Released: 02/10/2007 Document Revised: 12/26/2010 Document Reviewed: 02/10/2007 [facility] Patient Information 2012 [place], [place].

## 2011-06-03 NOTE — ED Provider Notes (Signed)
History     CSN: [id number]  Arrival date & time 06/03/11  1136   First MD Initiated Contact with Patient 06/03/11 1301     1:57 PM HPI Patient reports generalized weakness for 2 days. States gradually worsening. Reports associated with mild headaches, and feeling extreme fatigue. Reports difficulty with ambulation. Also reports a few episodes of vomiting and one episode of diarrhea. Denies associated cough, chest pain, shortness of breath, numbness, weakness, neck pain. Patient is a 78 y.o. female presenting with weakness. The history is provided by the patient.  Weakness The primary symptoms include headaches and vomiting. Primary symptoms do not include syncope, loss of consciousness, altered mental status, seizures, dizziness, visual change, paresthesias or fever. The symptoms began 2 days ago. The symptoms are unchanged. The neurological symptoms are diffuse.  The headache is associated with weakness and loss of balance. The headache is not associated with photophobia, visual change, neck stiffness or paresthesias.  Additional symptoms include weakness and loss of balance. Additional symptoms do not include neck stiffness, lower back pain, photophobia, hallucinations, nystagmus or irritability. Medical issues also include diabetes and hypertension.    Past Medical History  Diagnosis Date  . Diabetic peripheral neuropathy   . Chronic renal failure   . Chronic knee pain   . Degenerative joint disease   . Hypertension   . Hyperlipidemia   . Vertigo   . Duodenitis   . Gastritis   . Anemia     Normal EGG, colonoscopy, and Sm. Bowel F/T 2006  . Health maintenance examination     Mammogram 10/11: Possible mass, right breast. No evidence of malignancy in left breast. Needle biopsy of left breast 12/11: No evidence of  carcinoma. Patient needs follow-up right breast diagnostic mammogram with ultrasound in 6 months.  . Diabetes mellitus 11/05    type II  . Osteoporosis 09/2009    T score  -2.8    Past Surgical History  Procedure Date  . Abdominal hysterectomy     1980's (Fibroids)  .  right needle localized lumpectomy.  12/11    Path report:FOCAL ATYPICAL DUCTAL HYPERPLASIA IN A BACKGROUND OF STROMAL  FIBROSIS. No evidence of carcinoma.   . Colonoscopy     8/11 by Dr. Christella Hartigan: Normal colon, Given your age, you will not need another colonoscopy for colon cancer screening or polyp surveillance    Family History  Problem Relation Age of Onset  . Diabetes Sister     History  Substance Use Topics  . Smoking status: Never Smoker   . Smokeless tobacco: Not on file  . Alcohol Use: Not on file    OB History    Grav Para Term Preterm Abortions TAB SAB Ect Mult Living                  Review of Systems  Constitutional: Negative for fever, chills and irritability.  HENT: Negative for nosebleeds, congestion, sore throat, neck pain and neck stiffness.   Eyes: Negative for photophobia.  Respiratory: Negative for cough and shortness of breath.   Cardiovascular: Negative for chest pain and syncope.  Gastrointestinal: Positive for vomiting and diarrhea. Negative for abdominal pain and constipation.  Genitourinary: Negative for dysuria, urgency, frequency, hematuria, flank pain, vaginal discharge and vaginal pain.  Musculoskeletal: Negative for back pain.  Neurological: Positive for weakness, headaches and loss of balance. Negative for dizziness, tremors, seizures, loss of consciousness, facial asymmetry, speech difficulty, numbness and paresthesias.  Psychiatric/Behavioral: Negative for hallucinations and altered mental status.  All other systems reviewed and are negative.    Allergies  Lisinopril  Home Medications   Current Outpatient Rx  Name Route Sig Dispense Refill  . ALENDRONATE SODIUM 70 MG PO TABS  TAKE 1 TABLET WEEKLY. TAKE ON AN EMPTY STOMACH AND STAY SITTING OR STANDING FOR 30 MINUTES. 4 tablet 11  . AMLODIPINE BESYLATE 10 MG PO TABS Oral Take 1 tablet  (10 mg total) by mouth daily. 30 tablet 11  . ASPIRIN EC 81 MG PO TBEC Oral Take 1 tablet (81 mg total) by mouth daily. 150 tablet 2  . ATENOLOL 50 MG PO TABS Oral Take 1 tablet (50 mg total) by mouth daily. 30 tablet 11  . ATORVASTATIN CALCIUM 10 MG PO TABS Oral Take 1 tablet (10 mg total) by mouth daily. 30 tablet 11  . CALCIUM CARBONATE-VITAMIN D 500-200 MG-UNIT PO TABS Oral Take 1 tablet by mouth 3 (three) times daily. 100 tablet 11  . FERROUS SULFATE 324 (65 FE) MG PO TBEC Oral Take 1 tablet by mouth daily. 30 tablet 11  . GLUCOSE BLOOD VI STRP  Use as instructed 100 each 12  . GLUCOSE BLOOD VI STRP  Use to test blood sugar 3 times a day as directed     . METFORMIN HCL 1000 MG PO TABS Oral Take 1 tablet (1,000 mg total) by mouth 2 (two) times daily with a meal. 60 tablet 6  . OMEPRAZOLE 20 MG PO CPDR Oral Take 1 capsule (20 mg total) by mouth daily. 30 capsule 11  . OXYCODONE-ACETAMINOPHEN 7.5-500 MG PO TABS Oral Take 1 tablet by mouth every 4 (four) hours as needed. 120 tablet 0  . SENNOSIDES 8.6 MG PO TABS  Take 2-4 tablets by mouth at bedtime as needed for constipation. 30 tablet 5  . TRIAMTERENE-HCTZ 37.5-25 MG PO TABS Oral Take 1 each (1 tablet total) by mouth daily. 90 tablet 2    BP 137/69  Pulse 68  Temp(Src) 99.5 F (37.5 C) (Oral)  Resp 12  SpO2 100%  Physical Exam  Vitals reviewed. Constitutional: She is oriented to person, place, and time. Vital signs are normal. She appears well-developed and well-nourished.  HENT:  Head: Normocephalic and atraumatic.  Eyes: Conjunctivae are normal. Pupils are equal, round, and reactive to light.  Neck: Normal range of motion. Neck supple.  Cardiovascular: Normal rate, regular rhythm and normal heart sounds.  Exam reveals no friction rub.   No murmur heard. Pulmonary/Chest: Effort normal and breath sounds normal. She has no wheezes. She has no rhonchi. She has no rales. She exhibits no tenderness.  Musculoskeletal: Normal range of  motion.  Neurological: She is alert and oriented to person, place, and time. She has normal strength. No cranial nerve deficit or sensory deficit. Coordination normal. GCS eye subscore is 4. GCS verbal subscore is 5. GCS motor subscore is 6.  Skin: Skin is warm and dry. No rash noted. No erythema. No pallor.    ED Course  Procedures  Results for orders placed during the hospital encounter of 06/03/11  CBC      Component Value Range   WBC 6.3  4.0 - 10.5 (K/uL)   RBC 4.18  3.87 - 5.11 (MIL/uL)   Hemoglobin 13.3  12.0 - 15.0 (g/dL)   HCT 16.1  09.6 - 04.5 (%)   MCV 94.7  78.0 - 100.0 (fL)   MCH 31.8  26.0 - 34.0 (pg)   MCHC 33.6  30.0 - 36.0 (g/dL)   RDW 14.3  11.5 - 15.5 (%)   Platelets 270  150 - 400 (K/uL)  DIFFERENTIAL      Component Value Range   Neutrophils Relative 74  43 - 77 (%)   Neutro Abs 4.6  1.7 - 7.7 (K/uL)   Lymphocytes Relative 20  12 - 46 (%)   Lymphs Abs 1.2  0.7 - 4.0 (K/uL)   Monocytes Relative 7  3 - 12 (%)   Monocytes Absolute 0.4  0.1 - 1.0 (K/uL)   Eosinophils Relative 0  0 - 5 (%)   Eosinophils Absolute 0.0  0.0 - 0.7 (K/uL)   Basophils Relative 0  0 - 1 (%)   Basophils Absolute 0.0  0.0 - 0.1 (K/uL)  COMPREHENSIVE METABOLIC PANEL      Component Value Range   Sodium 140  135 - 145 (mEq/L)   Potassium 3.4 (*) 3.5 - 5.1 (mEq/L)   Chloride 99  96 - 112 (mEq/L)   CO2 26  19 - 32 (mEq/L)   Glucose, Bld 128 (*) 70 - 99 (mg/dL)   BUN 22  6 - 23 (mg/dL)   Creatinine, Ser 4.09 (*) 0.50 - 1.10 (mg/dL)   Calcium 81.1 (*) 8.4 - 10.5 (mg/dL)   Total Protein 9.4 (*) 6.0 - 8.3 (g/dL)   Albumin 4.7  3.5 - 5.2 (g/dL)   AST 14  0 - 37 (U/L)   ALT 11  0 - 35 (U/L)   Alkaline Phosphatase 64  39 - 117 (U/L)   Total Bilirubin 0.4  0.3 - 1.2 (mg/dL)   GFR calc non Af Amer 38 (*) >90 (mL/min)   GFR calc Af Amer 44 (*) >90 (mL/min)  GLUCOSE, CAPILLARY      Component Value Range   Glucose-Capillary 124 (*) 70 - 99 (mg/dL)  POCT I-STAT TROPONIN I      Component Value  Range   Troponin i, poc 0.00  0.00 - 0.08 (ng/mL)   Comment 3            Dg Chest 2 View  06/03/2011  *RADIOLOGY REPORT*  Clinical Data: Nausea, diarrhea, loss of appetite, weakness, diabetes, hypertension  CHEST - 2 VIEW  Comparison: 04/13/2010  Findings: Enlargement of cardiac silhouette. Partially calcified tortuous aorta. Pulmonary vascularity normal. Lungs clear. No pleural effusion or pneumothorax. Bones demineralized. Minimal end plate spur formation thoracic spine.  IMPRESSION: Enlargement of cardiac silhouette. No acute abnormalities.  Original Report Authenticated By: Lollie Marrow, M.D.      MDM   3:55 PM Patient pending UA and Ambulation. Advised if NML UA the plan Was to d/c with close follow-up with PCP. Pt would be able to stay with daughter who is in the room with her. Pt signed out to [facility] and discussed with Dr. Judd Lien.     Thomasene Lot, PA-C 06/03/11 1557   4:30 PM ED ECG REPORT   Date: 06/03/2011  EKG Time: 4:30 PM  Rate: 69  Rhythm: normal sinus rhythm  Axis: LAD  Intervals:none  ST&T Change: none  Narrative Interpretation: LV[facility]   4:31 PM Patient Ambulate without difficulty her or exacerbation of symptoms. Labs and imaging are within normal limits. Will continue with plan to followup with primary care physician and stay with daughter. Patient and family agree with plan and are ready for discharge.            Thomasene Lot, PA-C 06/03/11 [phone]

## 2011-06-06 NOTE — ED Provider Notes (Signed)
Medical screening examination/treatment/procedure(s) were conducted as a shared visit with non-physician practitioner(s) and myself.  I personally evaluated the patient during the encounter.  The patient presented complaining of a two day history of weakness, but nothing specific.  The physical exam revealed a normal heart, lung, and neurological exam.  She appeared well and vital signs were stable.  The workup was unremarkable, but urine remained pending at shift change.  His care was signed over to P. Dammen pending results of a urinalysis.  She will be discharged regardless, however the ua will determine whether she goes with an antibiotic or not.  Geoffery Lyons, MD 06/06/11 [phone]

## 2011-06-27 ENCOUNTER — Other Ambulatory Visit: Payer: Self-pay | Admitting: Ophthalmology

## 2011-06-28 ENCOUNTER — Encounter: Payer: Self-pay | Admitting: Ophthalmology

## 2011-06-28 ENCOUNTER — Encounter: Payer: Self-pay | Admitting: *Deleted

## 2011-06-28 ENCOUNTER — Telehealth: Payer: Self-pay | Admitting: Gastroenterology

## 2011-06-28 ENCOUNTER — Ambulatory Visit (INDEPENDENT_AMBULATORY_CARE_PROVIDER_SITE_OTHER): Payer: PRIVATE HEALTH INSURANCE | Admitting: Ophthalmology

## 2011-06-28 VITALS — BP 137/75 | HR 72 | Temp 97.6°F | Ht 63.0 in | Wt 164.9 lb

## 2011-06-28 DIAGNOSIS — R1115 Cyclical vomiting syndrome unrelated to migraine: Secondary | ICD-10-CM

## 2011-06-28 DIAGNOSIS — E1142 Type 2 diabetes mellitus with diabetic polyneuropathy: Secondary | ICD-10-CM

## 2011-06-28 DIAGNOSIS — I10 Essential (primary) hypertension: Secondary | ICD-10-CM

## 2011-06-28 DIAGNOSIS — N179 Acute kidney failure, unspecified: Secondary | ICD-10-CM

## 2011-06-28 DIAGNOSIS — Z79899 Other long term (current) drug therapy: Secondary | ICD-10-CM

## 2011-06-28 DIAGNOSIS — E1149 Type 2 diabetes mellitus with other diabetic neurological complication: Secondary | ICD-10-CM

## 2011-06-28 DIAGNOSIS — E119 Type 2 diabetes mellitus without complications: Secondary | ICD-10-CM

## 2011-06-28 DIAGNOSIS — R1110 Vomiting, unspecified: Secondary | ICD-10-CM

## 2011-06-28 LAB — POCT GLYCOSYLATED HEMOGLOBIN (HGB A1C): Hemoglobin A1C: 6.3

## 2011-06-28 LAB — BASIC METABOLIC PANEL
Glucose, Bld: 112 mg/dL — ABNORMAL HIGH (ref 70–99)
Potassium: 3.6 mEq/L (ref 3.5–5.3)
Sodium: 138 mEq/L (ref 135–145)

## 2011-06-28 LAB — GLUCOSE, CAPILLARY: Glucose-Capillary: 119 mg/dL — ABNORMAL HIGH (ref 70–99)

## 2011-06-28 MED ORDER — GABAPENTIN 600 MG PO TABS: 600.0000 mg | ORAL_TABLET | Freq: Three times a day (TID) | ORAL | Status: DC

## 2011-06-28 MED ORDER — METOCLOPRAMIDE HCL 10 MG PO TABS: ORAL_TABLET | ORAL | Status: DC

## 2011-06-28 NOTE — Progress Notes (Unsigned)
Pt presents c/o 1 month of not being able to eat very much, N&V x 2 daily x 1 month, states she has no taste and no appetite. appt given per [place]. Dr Maisie Fus at [phone]

## 2011-06-28 NOTE — Progress Notes (Signed)
Subjective:   Patient ID: Wanda Mclaughlin female   DOB: 1928/10/19 78 y.o.   MRN: [id number]  HPI: [name] is a 78 y.o. who presents for follow up from ED when she was seen for weakness, nausea/vomiting, and headache.  Nausea/vomiting: Vomited as recently as this morning. Everyday basically twice a day. If she has eaten food. Vomitus is not bloody or bilious. BM once or twice a week.  Was given zofran and didn't feel like it helped very much. Has lost 23 lbs since November. Has no appetite to eat anything. Eats only 1 meal/day. No pain in belly.   Headache more frequently since last month  Weakness: patient had been mildly anemic in the past, but Hgb was 13.0 in the ED 2/4. Has not had anemia panel. Has no energy. Not doing normal activities.   Is out of gabapentin. Hasn't had in 2 months.   Diabetes- CBG is 119.   Past Medical History  Diagnosis Date  . Diabetic peripheral neuropathy   . Chronic renal failure   . Chronic knee pain   . Degenerative joint disease   . Hypertension   . Hyperlipidemia   . Vertigo   . Duodenitis   . Gastritis   . Anemia     Normal EGG, colonoscopy, and Sm. Bowel F/T 2006  . Health maintenance examination     Mammogram 10/11: Possible mass, right breast. No evidence of malignancy in left breast. Needle biopsy of left breast 12/11: No evidence of  carcinoma. Patient needs follow-up right breast diagnostic mammogram with ultrasound in 6 months.  . Diabetes mellitus 11/05    type II  . Osteoporosis 09/2009    T score -2.8   Current Outpatient Prescriptions  Medication Sig Dispense Refill  . alendronate (FOSAMAX) 70 MG tablet Take 70 mg by mouth every 7 (seven) days. Take with a full glass of water on an empty stomach.      Marland Kitchen amLODipine (NORVASC) 10 MG tablet Take 10 mg by mouth daily.      Marland Kitchen aspirin EC 81 MG tablet Take 1 tablet (81 mg total) by mouth daily.  150 tablet  2  . atenolol (TENORMIN) 50 MG tablet Take 1 tablet (50 mg total) by  mouth daily.  30 tablet  11  . atorvastatin (LIPITOR) 10 MG tablet Take 1 tablet (10 mg total) by mouth daily.  30 tablet  11  . calcium-vitamin D ([employer] OYSTER SHELL CALCIUM-VIT D) 500-200 MG-UNIT per tablet Take 1 tablet by mouth 3 (three) times daily.  100 tablet  11  . [employer] IRON 325 (65 FE) MG tablet TAKE 1 TABLET BY MOUTH EVERY DAY  30 tablet  11  . gabapentin (NEURONTIN) 600 MG tablet Take 600 mg by mouth 3 (three) times daily.      Marland Kitchen glucose blood (ACCU-CHEK ADVANTAGE TEST) test strip Use as instructed  100 each  12  . glucose blood (ONE TOUCH TEST STRIPS) test strip Use to test blood sugar 3 times a day as directed       . metFORMIN (GLUCOPHAGE) 1000 MG tablet Take 1 tablet (1,000 mg total) by mouth 2 (two) times daily with a meal.  60 tablet  6  . omeprazole (PRILOSEC) 20 MG capsule Take 1 capsule (20 mg total) by mouth daily.  30 capsule  11  . oxyCODONE-acetaminophen (PERCOCET) 7.5-500 MG per tablet Take 1 tablet by mouth every 4 (four) hours as needed.  120 tablet  0  .  senna (SENOKOT) 8.6 MG tablet Take 2-4 tablets by mouth at bedtime as needed for constipation.  30 tablet  5  . triamterene-hydrochlorothiazide (MAXZIDE-25) 37.5-25 MG per tablet Take 1 each (1 tablet total) by mouth daily.  90 tablet  2   Family History  Problem Relation Age of Onset  . Diabetes Sister    History   Social History  . Marital Status: Widowed    Spouse Name: N/A    Number of Children: N/A  . Years of Education: 10th grade   Occupational History  . cook     now retired   Social History Main Topics  . Smoking status: Never Smoker   . Smokeless tobacco: None  . Alcohol Use: None  . Drug Use: None  . Sexually Active: None   Other Topics Concern  . None   Social History Narrative   Lives by herself.Daughter lives near by and help her with groceries.Given diabetes card 05/03/2010   Objective:  Physical Exam: Filed Vitals:   06/28/11 1350  BP: 137/75  Pulse: 72  Temp: 97.6 F (36.4 C)    TempSrc: Oral  Height: 5\' 3"  (1.6 m)  Weight: 164 lb 14.4 oz (74.798 kg)  SpO2: 94%   General: elderly woman with reduced hearing sitting in chair HEENT: PERRL, EOMI, no scleral icterus Cardiac: RRR, no rubs, murmurs or gallops Pulm: clear to auscultation bilaterally, moving normal volumes of air Abd: soft, nontender, nondistended, BS present Ext: warm and well perfused, no pedal edema Neuro: alert and oriented X3, cranial nerves II-XII grossly intact  Assessment & Plan:

## 2011-06-28 NOTE — Progress Notes (Signed)
Agree with appt Thanks 

## 2011-06-28 NOTE — Patient Instructions (Signed)
Take the reglan 1 pill before you eat, up to 4 times a day.  -Follow up for endoscopy.

## 2011-06-28 NOTE — Telephone Encounter (Signed)
I do see in electronic chart where pt has seen Dr Christella Hartigan; he has done a couple of procedures on her. EGD 12/06/2004 showing Barrett's and HH. 12/27/2009 COLON - normal. Pt saw Dr Margorie John today; f/u for ED visit for n/v, weakness and headache as well as a 23 lb wt loss. Pt c/o vomiting after she eats and throat pain. Dr Maisie Fus would like pt seen for possible EGD. Explained to [facility] at Dr Maisie Fus' ofc she can be seen 07/01/11 at 0900am with Mike Gip, PA. Venita Sheffield will inform the pt.

## 2011-06-29 LAB — ANEMIA PANEL
ABS Retic: 40.4 10*3/uL (ref 19.0–186.0)
Iron: 54 ug/dL (ref 42–145)
RBC.: 4.04 MIL/uL (ref 3.87–5.11)
Retic Ct Pct: 1 % (ref 0.4–2.3)
UIBC: 229 ug/dL (ref 125–400)

## 2011-07-01 ENCOUNTER — Ambulatory Visit (INDEPENDENT_AMBULATORY_CARE_PROVIDER_SITE_OTHER): Payer: PRIVATE HEALTH INSURANCE | Admitting: Physician Assistant

## 2011-07-01 ENCOUNTER — Encounter: Payer: Self-pay | Admitting: Physician Assistant

## 2011-07-01 ENCOUNTER — Telehealth: Payer: Self-pay | Admitting: Ophthalmology

## 2011-07-01 ENCOUNTER — Other Ambulatory Visit (INDEPENDENT_AMBULATORY_CARE_PROVIDER_SITE_OTHER): Payer: PRIVATE HEALTH INSURANCE

## 2011-07-01 DIAGNOSIS — R1110 Vomiting, unspecified: Secondary | ICD-10-CM

## 2011-07-01 DIAGNOSIS — N179 Acute kidney failure, unspecified: Secondary | ICD-10-CM | POA: Insufficient documentation

## 2011-07-01 DIAGNOSIS — R1115 Cyclical vomiting syndrome unrelated to migraine: Secondary | ICD-10-CM | POA: Insufficient documentation

## 2011-07-01 DIAGNOSIS — R1310 Dysphagia, unspecified: Secondary | ICD-10-CM

## 2011-07-01 DIAGNOSIS — R634 Abnormal weight loss: Secondary | ICD-10-CM

## 2011-07-01 LAB — COMPREHENSIVE METABOLIC PANEL
AST: 32 U/L (ref 0–37)
Alkaline Phosphatase: 56 U/L (ref 39–117)
BUN: 22 mg/dL (ref 6–23)
Calcium: 9.2 mg/dL (ref 8.4–10.5)
Creatinine, Ser: 1.7 mg/dL — ABNORMAL HIGH (ref 0.4–1.2)
Glucose, Bld: 129 mg/dL — ABNORMAL HIGH (ref 70–99)

## 2011-07-01 LAB — CBC WITH DIFFERENTIAL/PLATELET
Basophils Relative: 0.6 % (ref 0.0–3.0)
Eosinophils Absolute: 0 10*3/uL (ref 0.0–0.7)
Eosinophils Relative: 0.7 % (ref 0.0–5.0)
HCT: 40 % (ref 36.0–46.0)
Hemoglobin: 13.3 g/dL (ref 12.0–15.0)
MCHC: 33.2 g/dL (ref 30.0–36.0)
MCV: 96.4 fl (ref 78.0–100.0)
Monocytes Absolute: 0.6 10*3/uL (ref 0.1–1.0)
Neutro Abs: 3.6 10*3/uL (ref 1.4–7.7)
RBC: 4.15 Mil/uL (ref 3.87–5.11)

## 2011-07-01 NOTE — Assessment & Plan Note (Addendum)
HgA1c is 6.3 today. Will continue metformin.

## 2011-07-01 NOTE — Assessment & Plan Note (Addendum)
Creatinine drawn on 3/1 came back 1.79 which is increased from prior values around 1.3. Patient had CMET drawn today 3/4 at GI office. Will await results of this draw to contact her with further instructions. Is slightly improved today at 1.7 and daughter says her vomiting is improved. Advised them to increase water intake.

## 2011-07-01 NOTE — Patient Instructions (Signed)
Please go to the basement level to have your labs drawn.   Continue the Prilosec capsules once daily in the Am before breakfast. Continue the Reglan 10 mg 4 times daily.  We have scheduled the Endoscopy with Dr. Christella Hartigan for Friday 07-05-2011.   Directions provided.

## 2011-07-01 NOTE — Telephone Encounter (Signed)
Daughter told me that Wanda Mclaughlin vomiting has improved and she hasn't been vomiting daily since she was seen on Friday. She says she only vomited once in three days. I told her that her lab results looked like she was dehydrated so I wanted to make sure that she was doing better and not worse. I encouraged her to drink more water.

## 2011-07-01 NOTE — Assessment & Plan Note (Signed)
Reasonably well controlled. Will continue current regimen.

## 2011-07-01 NOTE — Assessment & Plan Note (Signed)
Patient was out of gabapentin and felt that this helped her very much with her symptoms. Was re-prescribed.

## 2011-07-01 NOTE — Assessment & Plan Note (Signed)
Patient's weight loss of 20+ lbs is concerning for the possibility of malignancy. She has had past endoscopies in 2006 which showed duodenitis and gastric mucosal-type cells in the esophagus but no Barrett's esophagus. She may also have diabetic gastroparesis. Will prescribe reglan with meals up to 4X a day and see if this helps her.

## 2011-07-02 ENCOUNTER — Encounter: Payer: Self-pay | Admitting: Physician Assistant

## 2011-07-02 NOTE — Progress Notes (Signed)
Reviewed and agree with management. Godwin Tedesco D. Brandy Kabat, M.D., FACG  

## 2011-07-02 NOTE — Progress Notes (Signed)
Subjective:    Patient ID: Wanda Mclaughlin, female    DOB: [birth year], 78 y.o.   MRN: [id number]  HPI [name] is a pleasant 78 year old African American female known to Dr. Christella Hartigan with history of adult onset diabetes mellitus hypertension osteoporosis and hyperlipidemia She had undergone upper endoscopy in 2006 which was pertinent for Barrett's esophagus and a hiatal hernia and also had colonoscopy at that same time finding of internal hemorrhoids and a small colon polyp which was adenomatous.  She comes in today after being referred by her primary care physician with complaints of difficulty swallowing upper abdominal discomfort and weight loss of about 20 pounds over the past month. She says she had several week history of inability to eat and says that everything that she swallowed would come right back up. This included liquids and solids. On further questioning she does admit to a sense of dysphagia and feels that some of her foods are sticking in her chest in that she's actually regurgitating. At other times it seems as if she is vomiting immediately after eating. She has had some heartburn and indigestion and discomfort with swallowing as well. She has not had any significant abdominal pain but says she feels uncomfortable in her upper abdomen and gets full easily. Also had some nausea and abdominal bloating. Her bowel habits and alternate between loose stools and constipation she has not noted any melena or hematochezia. She's not had any fever or chills. She had not been on any new medications nor any recent antibiotics.  She did have labs done on 06/27/2028 with electrolytes normal BUN 32 creatinine 1.79. She has not had any recent imaging.    Review of Systems  Constitutional: Positive for appetite change and unexpected weight change.  HENT: Positive for trouble swallowing.   Eyes: Negative.   Respiratory: Negative.   Cardiovascular: Negative.   Gastrointestinal: Positive for nausea,  vomiting and abdominal distention.  Genitourinary: Negative.   Musculoskeletal: Negative.   Skin: Negative.   Neurological: Negative.   Hematological: Negative.   Psychiatric/Behavioral: Negative.    Outpatient Prescriptions Prior to Visit  Medication Sig Dispense Refill  . alendronate (FOSAMAX) 70 MG tablet Take 70 mg by mouth every 7 (seven) days. Take with a full glass of water on an empty stomach.      Marland Kitchen amLODipine (NORVASC) 10 MG tablet Take 10 mg by mouth daily.      Marland Kitchen aspirin EC 81 MG tablet Take 1 tablet (81 mg total) by mouth daily.  150 tablet  2  . atenolol (TENORMIN) 50 MG tablet Take 1 tablet (50 mg total) by mouth daily.  30 tablet  11  . atorvastatin (LIPITOR) 10 MG tablet Take 1 tablet (10 mg total) by mouth daily.  30 tablet  11  . calcium-vitamin D (CVS OYSTER SHELL CALCIUM-VIT D) 500-200 MG-UNIT per tablet Take 1 tablet by mouth 3 (three) times daily.  100 tablet  11  . CVS IRON 325 (65 FE) MG tablet TAKE 1 TABLET BY MOUTH EVERY DAY  30 tablet  11  . gabapentin (NEURONTIN) 600 MG tablet Take 1 tablet (600 mg total) by mouth 3 (three) times daily.  180 tablet  3  . glucose blood (ACCU-CHEK ADVANTAGE TEST) test strip Use as instructed  100 each  12  . glucose blood (ONE TOUCH TEST STRIPS) test strip Use to test blood sugar 3 times a day as directed       . metFORMIN (GLUCOPHAGE) 1000 MG tablet Take 1  tablet (1,000 mg total) by mouth 2 (two) times daily with a meal.  60 tablet  6  . metoCLOPramide (REGLAN) 10 MG tablet Take 1 tablet 10 minutes before eating up to 4 times a day.  120 tablet  2  . omeprazole (PRILOSEC) 20 MG capsule Take 1 capsule (20 mg total) by mouth daily.  30 capsule  11  . oxyCODONE-acetaminophen (PERCOCET) 7.5-500 MG per tablet Take 1 tablet by mouth every 4 (four) hours as needed.  120 tablet  0  . senna (SENOKOT) 8.6 MG tablet Take 2-4 tablets by mouth at bedtime as needed for constipation.  30 tablet  5  . triamterene-hydrochlorothiazide (MAXZIDE-25)  37.5-25 MG per tablet Take 1 each (1 tablet total) by mouth daily.  90 tablet  2   Allergies  Allergen Reactions  . Lisinopril     REACTION: Possi ble ANGIOEDEMA   Patient Active Problem List  Diagnoses  . DIABETES MELLITUS, TYPE II  . DIABETIC PERIPHERAL NEUROPATHY  . HYPERLIPIDEMIA  . ANEMIA NOS  . HYPERTENSION  . GASTRITIS  . RENAL DISEASE, CHRONIC, MILD  . DEGENERATIVE JOINT DISEASE  . KNEE PAIN, LEFT, CHRONIC  . OSTEOPOROSIS  . Palpitations  . Shortness of breath  . Vomiting, persistent, in adult  . Acute kidney injury       Objective:   Physical Exam well-developed elderly female in no acute distress, pleasant blood pressure 120/74 pulse 88. Accompanied by her daughter. HEENT; nontraumati,c normocephalic ,EOMI, PERRLA sclera anicteric oropharynx clear,Neck; Supple no JVD, Cardiovascular; regular rate and rhythm with S1-S2 no murmur gallop, Pulmonary; clear bilaterally decreased in the bases, Abdomen; large, soft bowel sounds are present no definite succussion splash she is mildly distended tender in the upper abdomen and mid abdomen no guarding no rebound no palpable mass or hepatosplenomegaly, Recta;l not done, Extremities ;no clubbing, cyanosis, or edema skin warm dry, Psych; mood and affect normal and appropriate.        Assessment & Plan:  #52  78 year old female with a one-month history of dysphagia nausea weight loss of 20 pounds early satiety and vague odynophagia. She had been started on a trial of metoclopramide 10 mg 4 times daily by her primary care physician within the past few days and that seems to be helping. This raises the question of underlying gastroparesis though it may just be having an anti-and neck affect. Etiology of her symptoms is not entirely clear that we do need to rule out gastroparesis peptic ulcer disease gastric lesion and/or esophagitis. #2 adult onset diabetes mellitus #3 hypertension #4 osteoporosis-on Fosamax #5 history of Barrett's  esophagus  Plan; patient will continue omeprazole 20 mg by mouth once daily Continue Reglan 10 mg one half hour a.c. and each bedtime We'll check CBC and CMET  today Schedule for upper endoscopy with Dr. Christella Hartigan as it is possible. If endoscopy is unrevealing she will need further imaging with possible CT and gastric emptying scan.

## 2011-07-03 ENCOUNTER — Other Ambulatory Visit: Payer: Self-pay | Admitting: *Deleted

## 2011-07-03 MED ORDER — POTASSIUM CHLORIDE 20 MEQ/15ML (10%) PO SOLN: ORAL | Status: DC

## 2011-07-05 ENCOUNTER — Ambulatory Visit (AMBULATORY_SURGERY_CENTER): Payer: PRIVATE HEALTH INSURANCE | Admitting: Gastroenterology

## 2011-07-05 ENCOUNTER — Encounter: Payer: Self-pay | Admitting: Gastroenterology

## 2011-07-05 VITALS — BP 150/90 | HR 90 | Temp 98.0°F | Resp 15 | Ht 63.0 in | Wt 161.0 lb

## 2011-07-05 DIAGNOSIS — R1310 Dysphagia, unspecified: Secondary | ICD-10-CM

## 2011-07-05 DIAGNOSIS — R634 Abnormal weight loss: Secondary | ICD-10-CM

## 2011-07-05 DIAGNOSIS — R933 Abnormal findings on diagnostic imaging of other parts of digestive tract: Secondary | ICD-10-CM

## 2011-07-05 DIAGNOSIS — K2970 Gastritis, unspecified, without bleeding: Secondary | ICD-10-CM

## 2011-07-05 DIAGNOSIS — K2990 Gastroduodenitis, unspecified, without bleeding: Secondary | ICD-10-CM

## 2011-07-05 DIAGNOSIS — K210 Gastro-esophageal reflux disease with esophagitis: Secondary | ICD-10-CM

## 2011-07-05 DIAGNOSIS — R1110 Vomiting, unspecified: Secondary | ICD-10-CM

## 2011-07-05 LAB — GLUCOSE, CAPILLARY: Glucose-Capillary: 117 mg/dL — ABNORMAL HIGH (ref 70–99)

## 2011-07-05 MED ORDER — SODIUM CHLORIDE 0.9 % IV SOLN: 500.0000 mL | INTRAVENOUS | Status: DC

## 2011-07-05 NOTE — Op Note (Signed)
[facility] [address] [place], [place]  [address]  ENDOSCOPY PROCEDURE REPORT PATIENT:  Jaloni, Sorber  MR#:  [id number] BIRTHDATE:  1929/02/20, 82 yrs. old  GENDER:  female ENDOSCOPIST:  Rachael Fee, MD PROCEDURE DATE:  07/05/2011 PROCEDURE:  EGD with biopsy, 43239 ASA CLASS:  Class III INDICATIONS:  Barretts appearing mucosa 2006 however biopsies showed no IM, now with dsyphagia, dyspepsia MEDICATIONS:  Fentanyl 50 mcg IV, These medications were titrated to patient response per physician's verbal order, Versed 4 mg IV TOPICAL ANESTHETIC:  Cetacaine Spray DESCRIPTION OF PROCEDURE:   After the risks benefits and alternatives of the procedure were thoroughly explained, informed consent was obtained.  The LB [id number] endoscope was introduced through the mouth and advanced to the second portion of the duodenum, without limitations.  The instrument was slowly withdrawn as the mucosa was fully examined. <<PROCEDUREIMAGES>> There was a single tongue of Barrett's like mucosa without nodules or strictures. Biopsies taken and sent to pathology (jar 2) (see image2).  A hiatal hernia was found. This was 2cm (see image3). Mild gastritis was found. This was mild, non-specific and biopsies were taken, sent to pathology (jar 1) (see image4).  In very proximal esophagus there appeared to be a thin web of tissue that initially created slight difficulty in passing scope. Scope was passed with minor pressure, there was no bleeding at the site (see image1).  Otherwise the examination was normal (see image5). Retroflexed views revealed no abnormalities.    The scope was then withdrawn from the patient and the procedure completed. COMPLICATIONS:  None  ENDOSCOPIC IMPRESSION: 1) Barrett's like mucosa at GE junction, biopsied 2) Small hiatal hernia 3) Mild gastritis, biopsied to check for H. pylori 4) Web like ring of tissue in very proximal esophagus 5) Otherwise normal  examination  RECOMMENDATIONS: Await final biopsies.  Dr. Christella Hartigan' office will call to schedule return visit in 4 weeks to check on your symptoms.  ______________________________ Rachael Fee, MD  n. eSIGNED:   Rachael Fee at 07/05/2011 09:13 AM  Winfred Burn, [id number]

## 2011-07-05 NOTE — Patient Instructions (Addendum)
CONTINUE YOUR MEDICATIONS.  AWAIT PATHOLOGY RESULTS.YOU HAD AN ENDOSCOPIC PROCEDURE TODAY AT THE  ENDOSCOPY CENTER: Refer to the procedure report that was given to you for any specific questions about what was found during the examination.  If the procedure report does not answer your questions, please call your gastroenterologist to clarify.  If you requested that your care partner not be given the details of your procedure findings, then the procedure report has been included in a sealed envelope for you to review at your convenience later.  YOU SHOULD EXPECT: Some feelings of bloating in the abdomen. Passage of more gas than usual.  Walking can help get rid of the air that was put into your GI tract during the procedure and reduce the bloating. If you had a lower endoscopy (such as a colonoscopy or flexible sigmoidoscopy) you may notice spotting of blood in your stool or on the toilet paper. If you underwent a bowel prep for your procedure, then you may not have a normal bowel movement for a few days.  DIET: Your first meal following the procedure should be a light meal and then it is ok to progress to your normal diet.  A half-sandwich or bowl of soup is an example of a good first meal.  Heavy or fried foods are harder to digest and may make you feel nauseous or bloated.  Likewise meals heavy in dairy and vegetables can cause extra gas to form and this can also increase the bloating.  Drink plenty of fluids but you should avoid alcoholic beverages for 24 hours.  ACTIVITY: Your care partner should take you home directly after the procedure.  You should plan to take it easy, moving slowly for the rest of the day.  You can resume normal activity the day after the procedure however you should NOT DRIVE or use heavy machinery for 24 hours (because of the sedation medicines used during the test).    SYMPTOMS TO REPORT IMMEDIATELY: A gastroenterologist can be reached at any hour.  During normal  business hours, 8:30 AM to 5:00 PM Monday through Friday, call [phone].  After hours and on weekends, please call the GI answering service at [phone] who will take a message and have the physician on call contact you.   Following lower endoscopy (colonoscopy or flexible sigmoidoscopy):  Excessive amounts of blood in the stool  Significant tenderness or worsening of abdominal pains  Swelling of the abdomen that is new, acute  Fever of 100F or higher  Following upper endoscopy (EGD)  Vomiting of blood or coffee ground material  New chest pain or pain under the shoulder blades  Painful or persistently difficult swallowing  New shortness of breath  Fever of 100F or higher  Black, tarry-looking stools  FOLLOW UP: If any biopsies were taken you will be contacted by phone or by letter within the next 1-3 weeks.  Call your gastroenterologist if you have not heard about the biopsies in 3 weeks.  Our staff will call the home number listed on your records the next business day following your procedure to check on you and address any questions or concerns that you may have at that time regarding the information given to you following your procedure. This is a courtesy call and so if there is no answer at the home number and we have not heard from you through the emergency physician on call, we will assume that you have returned to your regular daily activities without incident.    SIGNATURES/CONFIDENTIALITY: You and/or your care partner have signed paperwork which will be entered into your electronic medical record.  These signatures attest to the fact that that the information above on your After Visit Summary has been reviewed and is understood.  Full responsibility of the confidentiality of this discharge information lies with you and/or your care-partner. Patient did not experience any of the following events: a burn prior to discharge; a fall within the facility; wrong  site/side/patient/procedure/implant event; or a hospital transfer or hospital admission upon discharge from the facility. (G8907) Patient did not have preoperative order for IV antibiotic SSI prophylaxis. (G8918)  

## 2011-07-08 ENCOUNTER — Telehealth: Payer: Self-pay

## 2011-07-08 NOTE — Telephone Encounter (Signed)
I called the pt's daughter number [phone] and the answering machine picked up and said the voice mail box was not set up.  No message left. Maw

## 2011-07-12 ENCOUNTER — Encounter: Payer: PRIVATE HEALTH INSURANCE | Admitting: Internal Medicine

## 2011-07-22 ENCOUNTER — Other Ambulatory Visit: Payer: Self-pay | Admitting: Internal Medicine

## 2011-07-31 ENCOUNTER — Ambulatory Visit (INDEPENDENT_AMBULATORY_CARE_PROVIDER_SITE_OTHER): Payer: PRIVATE HEALTH INSURANCE | Admitting: Internal Medicine

## 2011-07-31 ENCOUNTER — Encounter: Payer: Self-pay | Admitting: Internal Medicine

## 2011-07-31 VITALS — BP 149/75 | HR 62 | Temp 97.9°F | Resp 20 | Ht 63.0 in | Wt 178.5 lb

## 2011-07-31 DIAGNOSIS — K2970 Gastritis, unspecified, without bleeding: Secondary | ICD-10-CM

## 2011-07-31 DIAGNOSIS — E119 Type 2 diabetes mellitus without complications: Secondary | ICD-10-CM

## 2011-07-31 DIAGNOSIS — E785 Hyperlipidemia, unspecified: Secondary | ICD-10-CM

## 2011-07-31 DIAGNOSIS — K3184 Gastroparesis: Secondary | ICD-10-CM

## 2011-07-31 DIAGNOSIS — K2990 Gastroduodenitis, unspecified, without bleeding: Secondary | ICD-10-CM

## 2011-07-31 DIAGNOSIS — E1149 Type 2 diabetes mellitus with other diabetic neurological complication: Secondary | ICD-10-CM

## 2011-07-31 DIAGNOSIS — R1115 Cyclical vomiting syndrome unrelated to migraine: Secondary | ICD-10-CM

## 2011-07-31 DIAGNOSIS — R1110 Vomiting, unspecified: Secondary | ICD-10-CM

## 2011-07-31 DIAGNOSIS — E1143 Type 2 diabetes mellitus with diabetic autonomic (poly)neuropathy: Secondary | ICD-10-CM

## 2011-07-31 DIAGNOSIS — N289 Disorder of kidney and ureter, unspecified: Secondary | ICD-10-CM

## 2011-07-31 DIAGNOSIS — M25569 Pain in unspecified knee: Secondary | ICD-10-CM

## 2011-07-31 DIAGNOSIS — I10 Essential (primary) hypertension: Secondary | ICD-10-CM

## 2011-07-31 DIAGNOSIS — M1990 Unspecified osteoarthritis, unspecified site: Secondary | ICD-10-CM

## 2011-07-31 DIAGNOSIS — M810 Age-related osteoporosis without current pathological fracture: Secondary | ICD-10-CM

## 2011-07-31 DIAGNOSIS — N182 Chronic kidney disease, stage 2 (mild): Secondary | ICD-10-CM

## 2011-07-31 LAB — GLUCOSE, CAPILLARY: Glucose-Capillary: 84 mg/dL (ref 70–99)

## 2011-07-31 MED ORDER — GLUCOSE BLOOD VI STRP: ORAL_STRIP | Status: DC

## 2011-07-31 MED ORDER — ATORVASTATIN CALCIUM 10 MG PO TABS: 10.0000 mg | ORAL_TABLET | Freq: Every day | ORAL | Status: DC

## 2011-07-31 MED ORDER — OXYCODONE-ACETAMINOPHEN 7.5-500 MG PO TABS: 1.0000 | ORAL_TABLET | ORAL | Status: DC | PRN

## 2011-07-31 MED ORDER — METOCLOPRAMIDE HCL 10 MG PO TABS: ORAL_TABLET | ORAL | Status: DC

## 2011-07-31 MED ORDER — OMEPRAZOLE 20 MG PO CPDR: 20.0000 mg | DELAYED_RELEASE_CAPSULE | Freq: Every day | ORAL | Status: DC

## 2011-07-31 MED ORDER — AMLODIPINE BESYLATE 10 MG PO TABS: 10.0000 mg | ORAL_TABLET | Freq: Every day | ORAL | Status: DC

## 2011-07-31 MED ORDER — TRIAMTERENE-HCTZ 37.5-25 MG PO TABS: 1.0000 | ORAL_TABLET | Freq: Every day | ORAL | Status: DC

## 2011-07-31 MED ORDER — ASPIRIN EC 81 MG PO TBEC: 81.0000 mg | DELAYED_RELEASE_TABLET | Freq: Every day | ORAL | Status: DC

## 2011-07-31 MED ORDER — ATENOLOL 50 MG PO TABS: 50.0000 mg | ORAL_TABLET | Freq: Every day | ORAL | Status: DC

## 2011-07-31 NOTE — Progress Notes (Signed)
Subjective:     Patient ID: Wanda Mclaughlin, female   DOB: 24-Nov-1928, 78 y.o.   MRN: [id number]  HPI  Returns today for f/u of nausea, vomiting and weight loss.  Evaluated by Dr. Christella Hartigan of [facility] for possible Barrets esophagus.  Biopsy from EGD without evidence of Barrets.  Pt reports great improvement since starting Reglan.  Review of Systems  Constitutional: Positive for unexpected weight change. Negative for fever and fatigue.       Gaining weight  HENT: Negative for congestion.   Eyes: Negative for visual disturbance.  Respiratory: Negative for shortness of breath.   Cardiovascular: Negative for chest pain.  Gastrointestinal: Negative for abdominal distention.       No nausea or vomting       Objective:   Physical Exam  Constitutional: She is oriented to person, place, and time. She appears well-developed and well-nourished. No distress.  HENT:  Head: Normocephalic and atraumatic.  Eyes: Conjunctivae and EOM are normal. Pupils are equal, round, and reactive to light.  Neck: Normal range of motion. Neck supple.  Cardiovascular: Normal rate, regular rhythm, normal heart sounds and intact distal pulses.   Pulmonary/Chest: Effort normal and breath sounds normal.  Abdominal: Soft. Bowel sounds are normal. She exhibits no distension. There is no tenderness.  Musculoskeletal: Normal range of motion. She exhibits no edema.  Neurological: She is alert and oriented to person, place, and time.  Skin: Skin is warm and dry.  Psychiatric: She has a normal mood and affect.       Assessment:     1. Nausea and Vomiting: resolved with Reglan therapy, likely secondary to diabetic gastroparesis    Plan:     See problem for details:

## 2011-08-01 LAB — BASIC METABOLIC PANEL
Calcium: 9.8 mg/dL (ref 8.4–10.5)
Creat: 1.18 mg/dL — ABNORMAL HIGH (ref 0.50–1.10)
Glucose, Bld: 105 mg/dL — ABNORMAL HIGH (ref 70–99)
Sodium: 139 mEq/L (ref 135–145)

## 2011-08-02 DIAGNOSIS — K3184 Gastroparesis: Secondary | ICD-10-CM | POA: Insufficient documentation

## 2011-08-02 NOTE — Assessment & Plan Note (Signed)
Will check BMET to assess renal function given elevation on last visit though to be secondary to dehydration.  If continued elevation will have to re-assess Metformin therapy.

## 2011-08-02 NOTE — Assessment & Plan Note (Signed)
Resolved, likely secondary to gastroparesis.

## 2011-08-02 NOTE — Assessment & Plan Note (Signed)
Nausea, vomiting and abdominal discomfort improved with qid Reglan therapy. EGD per Dr. Christella Hartigan and bx w/o Barrett's findings. Pt to f/u with Dr. Christella Hartigan 08/01/2011.  Will continue Reglan therapy.

## 2011-08-05 ENCOUNTER — Ambulatory Visit: Payer: PRIVATE HEALTH INSURANCE | Admitting: Gastroenterology

## 2011-08-07 ENCOUNTER — Encounter: Payer: Self-pay | Admitting: Gastroenterology

## 2011-08-07 ENCOUNTER — Ambulatory Visit (INDEPENDENT_AMBULATORY_CARE_PROVIDER_SITE_OTHER): Payer: PRIVATE HEALTH INSURANCE | Admitting: Gastroenterology

## 2011-08-07 VITALS — BP 148/60 | HR 68 | Ht 63.0 in | Wt 179.0 lb

## 2011-08-07 DIAGNOSIS — R1110 Vomiting, unspecified: Secondary | ICD-10-CM

## 2011-08-07 DIAGNOSIS — K3184 Gastroparesis: Secondary | ICD-10-CM

## 2011-08-07 MED ORDER — METOCLOPRAMIDE HCL 10 MG PO TABS: 5.0000 mg | ORAL_TABLET | Freq: Four times a day (QID) | ORAL | Status: DC

## 2011-08-07 NOTE — Patient Instructions (Signed)
Please reduce the reglan to 1/2 pill 3-4 times a day (5mg  3-4 times a day).  You currently are taking 10mg  3-4 times a day. Call Dr. Christella Hartigan' office in 3-4 weeks to report on your symptoms.

## 2011-08-07 NOTE — Progress Notes (Signed)
Review of pertinent gastrointestinal problems: 1. Intermittent dysphagia, vomiting: EGD 3/13 found small HH, upper esophageal ring?, h. Pylori negative gastritis, GE junction without IM on biopsies.  Suspected to be due to gastroparesis given good response to Reglan   HPI: This is a very pleasant 78 year old woman whom I last saw time of an upper endoscopy about a month ago. She tells me that her nausea and vomiting is much better since starting Reglan 10 mg 3-4 times a day about 5-6 weeks ago. She really has no GI complaints today.  Nausea and vomiting both better. Swallowing is pretty.   Past Medical History  Diagnosis Date  . Diabetic peripheral neuropathy   . Chronic renal failure   . Chronic knee pain   . Degenerative joint disease   . Hypertension   . Hyperlipidemia   . Vertigo   . Duodenitis   . Gastritis   . Anemia     Normal EGG, colonoscopy, and Sm. Bowel F/T 2006  . Health maintenance examination     Mammogram 10/11: Possible mass, right breast. No evidence of malignancy in left breast. Needle biopsy of left breast 12/11: No evidence of  carcinoma. Patient needs follow-up right breast diagnostic mammogram with ultrasound in 6 months.  . Diabetes mellitus 11/05    type II  . Osteoporosis 09/2009    T score -2.8    Past Surgical History  Procedure Date  . Abdominal hysterectomy     1980's (Fibroids)  .  right needle localized lumpectomy.  12/11    Path report:FOCAL ATYPICAL DUCTAL HYPERPLASIA IN A BACKGROUND OF STROMAL  FIBROSIS. No evidence of carcinoma.   . Colonoscopy     8/11 by Dr. Christella Hartigan: Normal colon, Given your age, you will not need another colonoscopy for colon cancer screening or polyp surveillance    Current Outpatient Prescriptions  Medication Sig Dispense Refill  . ACCU-CHEK AVIVA PLUS test strip USE AS INSTRUCTED  100 strip  3  . alendronate (FOSAMAX) 70 MG tablet Take 70 mg by mouth every 7 (seven) days. Take with a full glass of water on an empty  stomach.      Marland Kitchen amLODipine (NORVASC) 10 MG tablet Take 1 tablet (10 mg total) by mouth daily.  30 tablet  11  . aspirin EC 81 MG tablet Take 1 tablet (81 mg total) by mouth daily.  150 tablet  2  . atenolol (TENORMIN) 50 MG tablet Take 1 tablet (50 mg total) by mouth daily.  30 tablet  11  . atorvastatin (LIPITOR) 10 MG tablet Take 1 tablet (10 mg total) by mouth daily.  30 tablet  11  . calcium-vitamin D (CVS OYSTER SHELL CALCIUM-VIT D) 500-200 MG-UNIT per tablet Take 1 tablet by mouth 3 (three) times daily.  100 tablet  11  . CVS IRON 325 (65 FE) MG tablet TAKE 1 TABLET BY MOUTH EVERY DAY  30 tablet  11  . gabapentin (NEURONTIN) 600 MG tablet Take 1 tablet (600 mg total) by mouth 3 (three) times daily.  180 tablet  3  . glucose blood (ONE TOUCH TEST STRIPS) test strip Use to test blood sugar 3 times a day as directed  100 each  11  . metFORMIN (GLUCOPHAGE) 1000 MG tablet Take 1 tablet (1,000 mg total) by mouth 2 (two) times daily with a meal.  60 tablet  6  . metoCLOPramide (REGLAN) 10 MG tablet Take 1 tablet 10 minutes before eating up to 4 times a day.  120  tablet  2  . omeprazole (PRILOSEC) 20 MG capsule Take 1 capsule (20 mg total) by mouth daily.  30 capsule  11  . oxyCODONE-acetaminophen (PERCOCET) 7.5-500 MG per tablet Take 1 tablet by mouth every 4 (four) hours as needed.  120 tablet  0  . potassium chloride 20 MEQ/15ML (10%) SOLN Take 20 MEQ bid x 5 days  150 mL  0  . senna (SENOKOT) 8.6 MG tablet Take 2-4 tablets by mouth at bedtime as needed for constipation.  30 tablet  5  . triamterene-hydrochlorothiazide (MAXZIDE-25) 37.5-25 MG per tablet Take 1 each (1 tablet total) by mouth daily.  90 tablet  2  . DISCONTD: triamterene-hydrochlorothiazide (MAXZIDE-25) 37.5-25 MG per tablet TAKE 1 TABLET BY MOUTH ONCE DAILY  30 tablet  2    Allergies as of 08/07/2011 - Review Complete 08/07/2011  Allergen Reaction Noted  . Lisinopril  09/20/2009    Family History  Problem Relation Age of  Onset  . Diabetes Sister     History   Social History  . Marital Status: Widowed    Spouse Name: N/A    Number of Children: 5  . Years of Education: 10th grade   Occupational History  . cook     now retired   Social History Main Topics  . Smoking status: Never Smoker   . Smokeless tobacco: Never Used  . Alcohol Use: No  . Drug Use: No  . Sexually Active: Not on file   Other Topics Concern  . Not on file   Social History Narrative   Lives by herself.Daughter lives near by and help her with groceries.Given diabetes card 05/03/2010      Physical Exam: BP 148/60  Pulse 68  Ht 5\' 3"  (1.6 m)  Wt 179 lb (81.194 kg)  BMI 31.71 kg/m2 Constitutional: generally well-appearing Psychiatric: alert and oriented x3 Abdomen: soft, nontender, nondistended, no obvious ascites, no peritoneal signs, normal bowel sounds     Assessment and plan: 78 y.o. female with presumed gastroparesis   she is currently on 10 mg 3-4 times a day Reglan. Going to have her cut these pills in half to see if 5 mg dosing will work for her just as well. She will call the office in 3-4 weeks to report on her symptoms.

## 2011-08-21 ENCOUNTER — Other Ambulatory Visit: Payer: Self-pay | Admitting: *Deleted

## 2011-08-21 DIAGNOSIS — I10 Essential (primary) hypertension: Secondary | ICD-10-CM

## 2011-08-21 DIAGNOSIS — E785 Hyperlipidemia, unspecified: Secondary | ICD-10-CM

## 2011-08-21 MED ORDER — ATORVASTATIN CALCIUM 10 MG PO TABS: 10.0000 mg | ORAL_TABLET | Freq: Every day | ORAL | Status: DC

## 2011-08-21 MED ORDER — ATENOLOL 50 MG PO TABS: 50.0000 mg | ORAL_TABLET | Freq: Every day | ORAL | Status: DC

## 2011-09-04 ENCOUNTER — Other Ambulatory Visit: Payer: Self-pay | Admitting: *Deleted

## 2011-09-04 DIAGNOSIS — M1990 Unspecified osteoarthritis, unspecified site: Secondary | ICD-10-CM

## 2011-09-04 DIAGNOSIS — M25569 Pain in unspecified knee: Secondary | ICD-10-CM

## 2011-09-04 NOTE — Telephone Encounter (Signed)
Daughter will pick up written Rx when ready Jillene Bucks [phone].

## 2011-09-06 MED ORDER — OXYCODONE-ACETAMINOPHEN 7.5-500 MG PO TABS: 1.0000 | ORAL_TABLET | Freq: Four times a day (QID) | ORAL | Status: DC | PRN

## 2011-10-09 ENCOUNTER — Ambulatory Visit (INDEPENDENT_AMBULATORY_CARE_PROVIDER_SITE_OTHER): Payer: PRIVATE HEALTH INSURANCE | Admitting: Internal Medicine

## 2011-10-09 VITALS — BP 144/70 | HR 69 | Temp 97.4°F | Wt 184.8 lb

## 2011-10-09 DIAGNOSIS — R609 Edema, unspecified: Secondary | ICD-10-CM

## 2011-10-09 DIAGNOSIS — Z79899 Other long term (current) drug therapy: Secondary | ICD-10-CM

## 2011-10-09 DIAGNOSIS — M25569 Pain in unspecified knee: Secondary | ICD-10-CM

## 2011-10-09 DIAGNOSIS — M1990 Unspecified osteoarthritis, unspecified site: Secondary | ICD-10-CM

## 2011-10-09 DIAGNOSIS — E119 Type 2 diabetes mellitus without complications: Secondary | ICD-10-CM

## 2011-10-09 LAB — BASIC METABOLIC PANEL WITH GFR
BUN: 18 mg/dL (ref 6–23)
CO2: 26 mEq/L (ref 19–32)
Calcium: 9.6 mg/dL (ref 8.4–10.5)
Chloride: 103 mEq/L (ref 96–112)
Creat: 1.29 mg/dL — ABNORMAL HIGH (ref 0.50–1.10)
GFR, Est African American: 45 mL/min — ABNORMAL LOW
GFR, Est Non African American: 39 mL/min — ABNORMAL LOW
Glucose, Bld: 102 mg/dL — ABNORMAL HIGH (ref 70–99)
Potassium: 3.8 mEq/L (ref 3.5–5.3)
Sodium: 141 mEq/L (ref 135–145)

## 2011-10-09 LAB — GLUCOSE, CAPILLARY: Glucose-Capillary: 102 mg/dL — ABNORMAL HIGH (ref 70–99)

## 2011-10-09 LAB — POCT GLYCOSYLATED HEMOGLOBIN (HGB A1C): Hemoglobin A1C: 5.9

## 2011-10-09 MED ORDER — OXYCODONE-ACETAMINOPHEN 7.5-500 MG PO TABS: 1.0000 | ORAL_TABLET | Freq: Four times a day (QID) | ORAL | Status: DC | PRN

## 2011-10-09 MED ORDER — FUROSEMIDE 20 MG PO TABS: 20.0000 mg | ORAL_TABLET | Freq: Two times a day (BID) | ORAL | Status: DC

## 2011-10-09 NOTE — Progress Notes (Signed)
  Subjective:    Patient ID: Wanda Mclaughlin, female    DOB: [birth year], 78 y.o.   MRN: [id number]  HPI  Patient is here today for swelling of her feet. patinet was doing well until 3 weeks ago when she started having swelling in both her legs upto the knees, no orthopnea or PND noted, no palpitations, chest pain, shortness of breath noted. The swelling is gradually progressive and worse at the end of the day. The swelling is better with rest.  Her BP is slightly elevated at 144/70.    Review of Systems  Constitutional: Negative for fever, activity change and appetite change.  HENT: Negative for sore throat.   Respiratory: Negative for cough and shortness of breath.   Cardiovascular: Positive for leg swelling. Negative for chest pain.  Gastrointestinal: Negative for nausea, abdominal pain, diarrhea, constipation and abdominal distention.  Genitourinary: Negative for frequency, hematuria and difficulty urinating.  Neurological: Negative for dizziness and headaches.  Psychiatric/Behavioral: Negative for suicidal ideas and behavioral problems.       Objective:   Physical Exam  Constitutional: She is oriented to person, place, and time. She appears well-developed and well-nourished.  HENT:  Head: Normocephalic and atraumatic.  Eyes: Conjunctivae and EOM are normal. Pupils are equal, round, and reactive to light. No scleral icterus.  Neck: Normal range of motion. Neck supple. No JVD present. No thyromegaly present.  Cardiovascular: Normal rate, regular rhythm, normal heart sounds and intact distal pulses.  Exam reveals no gallop and no friction rub.   No murmur heard. Pulmonary/Chest: Effort normal and breath sounds normal. No respiratory distress. She has no wheezes. She has no rales.  Abdominal: Soft. Bowel sounds are normal. She exhibits no distension and no mass. There is no tenderness. There is no rebound and no guarding.  Musculoskeletal: Normal range of motion. She exhibits edema  (b/l leg swelling up until knees). She exhibits no tenderness.  Lymphadenopathy:    She has no cervical adenopathy.  Neurological: She is alert and oriented to person, place, and time.  Psychiatric: She has a normal mood and affect. Her behavior is normal.          Assessment & Plan:

## 2011-10-09 NOTE — Assessment & Plan Note (Signed)
Patient is having progressively worse swelling in her legs. The most likely differential is venous insufficiency at this. I will change her Maxide to lasix 20 mg daily as her CRT is elevated and HCTZ may not be helping in the setting of CKD. I will also check KFT's today. Follow up in 2 weeks.

## 2011-10-09 NOTE — Patient Instructions (Signed)
Peripheral Edema You have swelling in your legs (peripheral edema). This swelling is due to excess accumulation of salt and water in your body. Edema may be a sign of heart, kidney or liver disease, or a side effect of a medication. It may also be due to problems in the leg veins. Elevating your legs and using special support stockings may be very helpful, if the cause of the swelling is due to poor venous circulation. Avoid long periods of standing, whatever the cause. Treatment of edema depends on identifying the cause. Chips, pretzels, pickles and other salty foods should be avoided. Restricting salt in your diet is almost always needed. Water pills (diuretics) are often used to remove the excess salt and water from your body via urine. These medicines prevent the kidney from reabsorbing sodium. This increases urine flow. Diuretic treatment may also result in lowering of potassium levels in your body. Potassium supplements may be needed if you have to use diuretics daily. Daily weights can help you keep track of your progress in clearing your edema. You should call your caregiver for follow up care as recommended. SEEK IMMEDIATE MEDICAL CARE IF:   You have increased swelling, pain, redness, or heat in your legs.   You develop shortness of breath, especially when lying down.   You develop chest or abdominal pain, weakness, or fainting.   You have a fever.  Document Released: 05/23/2004 Document Revised: 04/04/2011 Document Reviewed: 05/03/2009 Exit[employer] Patient Information 2012 [employer]. 

## 2011-10-23 ENCOUNTER — Ambulatory Visit (INDEPENDENT_AMBULATORY_CARE_PROVIDER_SITE_OTHER): Payer: PRIVATE HEALTH INSURANCE | Admitting: Internal Medicine

## 2011-10-23 ENCOUNTER — Encounter: Payer: Self-pay | Admitting: Internal Medicine

## 2011-10-23 VITALS — BP 140/85 | HR 75 | Temp 98.1°F | Ht 63.0 in | Wt 182.9 lb

## 2011-10-23 DIAGNOSIS — R609 Edema, unspecified: Secondary | ICD-10-CM

## 2011-10-23 DIAGNOSIS — M810 Age-related osteoporosis without current pathological fracture: Secondary | ICD-10-CM

## 2011-10-23 DIAGNOSIS — I10 Essential (primary) hypertension: Secondary | ICD-10-CM

## 2011-10-23 DIAGNOSIS — G8929 Other chronic pain: Secondary | ICD-10-CM

## 2011-10-23 DIAGNOSIS — E119 Type 2 diabetes mellitus without complications: Secondary | ICD-10-CM

## 2011-10-23 DIAGNOSIS — M25569 Pain in unspecified knee: Secondary | ICD-10-CM

## 2011-10-23 DIAGNOSIS — N179 Acute kidney failure, unspecified: Secondary | ICD-10-CM

## 2011-10-23 LAB — BASIC METABOLIC PANEL WITH GFR
BUN: 24 mg/dL — ABNORMAL HIGH (ref 6–23)
CO2: 30 mEq/L (ref 19–32)
Chloride: 100 mEq/L (ref 96–112)
Glucose, Bld: 84 mg/dL (ref 70–99)
Potassium: 3.9 mEq/L (ref 3.5–5.3)
Sodium: 142 mEq/L (ref 135–145)

## 2011-10-23 MED ORDER — METFORMIN HCL 500 MG PO TABS: 500.0000 mg | ORAL_TABLET | Freq: Two times a day (BID) | ORAL | Status: DC

## 2011-10-23 NOTE — Assessment & Plan Note (Signed)
Well-controlled at this time with HbA1c 5.9.  I have reduced metformin to 500 mg twice a day today. Eye exam was 2 days ago. Blood pressure is well-controlled. Urine albumin creatinine with the last one year. Patient has peripheral neuropathy which is controlled with gabapentin. Currently continuing aspirin.

## 2011-10-23 NOTE — Assessment & Plan Note (Addendum)
Patient had been on Fosamax for more than 3 years now. I will discontinue Fosamax at this time due to the side effect profile especially an 78 year old. I will continue vitamin D and calcium to reduce falls. There is no recommendation in terms of followup bone density scan. It is up to the discretion of primary care physician.

## 2011-10-23 NOTE — Patient Instructions (Signed)
Knee Pain The knee is the complex joint between your thigh and your lower leg. It is made up of bones, tendons, ligaments, and cartilage. The bones that make up the knee are:  The femur in the thigh.   The tibia and fibula in the lower leg.   The patella or kneecap riding in the groove on the lower femur.  CAUSES  Knee pain is a common complaint with many causes. A few of these causes are:  Injury, such as:   A ruptured ligament or tendon injury.   Torn cartilage.   Medical conditions, such as:   Gout   Arthritis   Infections   Overuse, over training or overdoing a physical activity.  Knee pain can be minor or severe. Knee pain can accompany debilitating injury. Minor knee problems often respond well to self-care measures or get well on their own. More serious injuries may need medical intervention or even surgery. SYMPTOMS The knee is complex. Symptoms of knee problems can vary widely. Some of the problems are:  Pain with movement and weight bearing.   Swelling and tenderness.   Buckling of the knee.   Inability to straighten or extend your knee.   Your knee locks and you cannot straighten it.   Warmth and redness with pain and fever.   Deformity or dislocation of the kneecap.  DIAGNOSIS  Determining what is wrong may be very straight forward such as when there is an injury. It can also be challenging because of the complexity of the knee. Tests to make a diagnosis may include:  Your caregiver taking a history and doing a physical exam.   Routine X-rays can be used to rule out other problems. X-rays will not reveal a cartilage tear. Some injuries of the knee can be diagnosed by:   Arthroscopy a surgical technique by which a small video camera is inserted through tiny incisions on the sides of the knee. This procedure is used to examine and repair internal knee joint problems. Tiny instruments can be used during arthroscopy to repair the torn knee cartilage  (meniscus).   Arthrography is a radiology technique. A contrast liquid is directly injected into the knee joint. Internal structures of the knee joint then become visible on X-ray film.   An MRI scan is a non x-ray radiology procedure in which magnetic fields and a computer produce two- or three-dimensional images of the inside of the knee. Cartilage tears are often visible using an MRI scanner. MRI scans have largely replaced arthrography in diagnosing cartilage tears of the knee.   Blood work.   Examination of the fluid that helps to lubricate the knee joint (synovial fluid). This is done by taking a sample out using a needle and a syringe.  TREATMENT The treatment of knee problems depends on the cause. Some of these treatments are:  Depending on the injury, proper casting, splinting, surgery or physical therapy care will be needed.   Give yourself adequate recovery time. Do not overuse your joints. If you begin to get sore during workout routines, back off. Slow down or do fewer repetitions.   For repetitive activities such as cycling or running, maintain your strength and nutrition.   Alternate muscle groups. For example if you are a weight lifter, work the upper body on one day and the lower body the next.   Either tight or weak muscles do not give the proper support for your knee. Tight or weak muscles do not absorb the stress placed   on the knee joint. Keep the muscles surrounding the knee strong.   Take care of mechanical problems.   If you have flat feet, orthotics or special shoes may help. See your caregiver if you need help.   Arch supports, sometimes with wedges on the inner or outer aspect of the heel, can help. These can shift pressure away from the side of the knee most bothered by osteoarthritis.   A brace called an "unloader" brace also may be used to help ease the pressure on the most arthritic side of the knee.   If your caregiver has prescribed crutches, braces,  wraps or ice, use as directed. The acronym for this is PRICE. This means protection, rest, ice, compression and elevation.   Nonsteroidal anti-inflammatory drugs (NSAID's), can help relieve pain. But if taken immediately after an injury, they may actually increase swelling. Take NSAID's with food in your stomach. Stop them if you develop stomach problems. Do not take these if you have a history of ulcers, stomach pain or bleeding from the bowel. Do not take without your caregiver's approval if you have problems with fluid retention, heart failure, or kidney problems.   For ongoing knee problems, physical therapy may be helpful.   Glucosamine and chondroitin are over-the-counter dietary supplements. Both may help relieve the pain of osteoarthritis in the knee. These medicines are different from the usual anti-inflammatory drugs. Glucosamine may decrease the rate of cartilage destruction.   Injections of a corticosteroid drug into your knee joint may help reduce the symptoms of an arthritis flare-up. They may provide pain relief that lasts a few months. You may have to wait a few months between injections. The injections do have a small increased risk of infection, water retention and elevated blood sugar levels.   Hyaluronic acid injected into damaged joints may ease pain and provide lubrication. These injections may work by reducing inflammation. A series of shots may give relief for as long as 6 months.   Topical painkillers. Applying certain ointments to your skin may help relieve the pain and stiffness of osteoarthritis. Ask your pharmacist for suggestions. Many over the-counter products are approved for temporary relief of arthritis pain.   In some countries, doctors often prescribe topical NSAID's for relief of chronic conditions such as arthritis and tendinitis. A review of treatment with NSAID creams found that they worked as well as oral medications but without the serious side effects.    PREVENTION  Maintain a healthy weight. Extra pounds put more strain on your joints.   Get strong, stay limber. Weak muscles are a common cause of knee injuries. Stretching is important. Include flexibility exercises in your workouts.   Be smart about exercise. If you have osteoarthritis, chronic knee pain or recurring injuries, you may need to change the way you exercise. This does not mean you have to stop being active. If your knees ache after jogging or playing basketball, consider switching to swimming, water aerobics or other low-impact activities, at least for a few days a week. Sometimes limiting high-impact activities will provide relief.   Make sure your shoes fit well. Choose footwear that is right for your sport.   Protect your knees. Use the proper gear for knee-sensitive activities. Use kneepads when playing volleyball or laying carpet. Buckle your seat belt every time you drive. Most shattered kneecaps occur in car accidents.   Rest when you are tired.  SEEK MEDICAL CARE IF:  You have knee pain that is continual and does not   seem to be getting better.  SEEK IMMEDIATE MEDICAL CARE IF:  Your knee joint feels hot to the touch and you have a high fever. MAKE SURE YOU:   Understand these instructions.   Will watch your condition.   Will get help right away if you are not doing well or get worse.  Document Released: 02/10/2007 Document Revised: 04/04/2011 Document Reviewed: 02/10/2007 [employer] Patient Information 2012 [employer]. 

## 2011-10-23 NOTE — Assessment & Plan Note (Signed)
Ankle edema has gone down significantly as compared last office visit I would continue her on current dose of Lasix at this time.

## 2011-10-23 NOTE — Assessment & Plan Note (Signed)
I would repeat kidney function test today.

## 2011-10-23 NOTE — Progress Notes (Signed)
  Subjective:    Patient ID: Wanda Mclaughlin, female    DOB: Aug 26, 1928, 78 y.o.   MRN: [id number]  HPI  Wanda Mclaughlin is 78 year old female with past medical history most significant for well-controlled diabetes type 2, hypertension, mild chronic kidney disease, chronic knee pain and osteoporosis.  Patient was seen 2 weeks ago for increased pain and swelling in his legs. She was started on Lasix at that time. Patient swelling has significantly improved Over last 2 weeks. She does not complain of pain in her legs.  Today she comes in with pain in her left knee which is a chronic problem for her but thinks that acutely got worse because of her fall. There is no excessive swelling, redness associated with left knee pain. Pain is nonradiating. It is still and similar to the pain that she is used to having.  No other complaints at this time.   Review of Systems  Constitutional: Negative for fever, activity change and appetite change.  HENT: Negative for sore throat.   Respiratory: Negative for cough and shortness of breath.   Cardiovascular: Positive for leg swelling. Negative for chest pain.  Gastrointestinal: Negative for nausea, abdominal pain, diarrhea, constipation and abdominal distention.  Genitourinary: Negative for frequency, hematuria and difficulty urinating.  Musculoskeletal: Positive for arthralgias.  Neurological: Negative for dizziness and headaches.  Psychiatric/Behavioral: Negative for suicidal ideas and behavioral problems.       Objective:   Physical Exam  Constitutional: She is oriented to person, place, and time. She appears well-developed and well-nourished.  HENT:  Head: Normocephalic and atraumatic.  Eyes: Conjunctivae and EOM are normal. Pupils are equal, round, and reactive to light. No scleral icterus.  Neck: Normal range of motion. Neck supple. No JVD present. No thyromegaly present.  Cardiovascular: Normal rate, regular rhythm, normal heart sounds and intact  distal pulses.  Exam reveals no gallop and no friction rub.   No murmur heard. Pulmonary/Chest: Effort normal and breath sounds normal. No respiratory distress. She has no wheezes. She has no rales.  Abdominal: Soft. Bowel sounds are normal. She exhibits no distension and no mass. There is no tenderness. There is no rebound and no guarding.  Musculoskeletal: Normal range of motion. She exhibits edema (1+ pitting edema bilateral) and tenderness (Patient had restricted range of motion in the left knee joint due to pain. No joint line tenderness noted. Valgus and status extension was associated with tenderness. No tenderness or quadriceps or patellar region. Lachman test was negative).  Lymphadenopathy:    She has no cervical adenopathy.  Neurological: She is alert and oriented to person, place, and time.  Psychiatric: She has a normal mood and affect. Her behavior is normal.          Assessment & Plan:

## 2011-10-23 NOTE — Assessment & Plan Note (Addendum)
Patient was given Ace bandage wrap from the clinic . This is something chronic and we discussed about the possibility of steroid injections in the knee to relieve the pain . Patient was advised to limit her use of ibuprofen or any other NSAIDs in view of her mild chronic kidney disease. Patient was asking in for extra oxycodone tablets. I have referred this issue to be taken care of by primary care physician at this time. Patient was prescribed 120 tablets of oxycodone at last office visit on 10/09/2011.

## 2011-11-10 ENCOUNTER — Other Ambulatory Visit: Payer: Self-pay | Admitting: Internal Medicine

## 2011-11-25 ENCOUNTER — Ambulatory Visit (INDEPENDENT_AMBULATORY_CARE_PROVIDER_SITE_OTHER): Payer: PRIVATE HEALTH INSURANCE | Admitting: Internal Medicine

## 2011-11-25 ENCOUNTER — Encounter: Payer: Self-pay | Admitting: Internal Medicine

## 2011-11-25 VITALS — BP 166/79 | HR 67 | Temp 97.5°F | Ht 63.0 in | Wt 186.3 lb

## 2011-11-25 DIAGNOSIS — K319 Disease of stomach and duodenum, unspecified: Secondary | ICD-10-CM

## 2011-11-25 DIAGNOSIS — R609 Edema, unspecified: Secondary | ICD-10-CM

## 2011-11-25 DIAGNOSIS — M25569 Pain in unspecified knee: Secondary | ICD-10-CM

## 2011-11-25 DIAGNOSIS — G8929 Other chronic pain: Secondary | ICD-10-CM

## 2011-11-25 DIAGNOSIS — IMO0002 Reserved for concepts with insufficient information to code with codable children: Secondary | ICD-10-CM

## 2011-11-25 DIAGNOSIS — M1990 Unspecified osteoarthritis, unspecified site: Secondary | ICD-10-CM

## 2011-11-25 DIAGNOSIS — E1169 Type 2 diabetes mellitus with other specified complication: Secondary | ICD-10-CM

## 2011-11-25 DIAGNOSIS — I10 Essential (primary) hypertension: Secondary | ICD-10-CM

## 2011-11-25 DIAGNOSIS — E119 Type 2 diabetes mellitus without complications: Secondary | ICD-10-CM

## 2011-11-25 DIAGNOSIS — R1110 Vomiting, unspecified: Secondary | ICD-10-CM

## 2011-11-25 DIAGNOSIS — I129 Hypertensive chronic kidney disease with stage 1 through stage 4 chronic kidney disease, or unspecified chronic kidney disease: Secondary | ICD-10-CM

## 2011-11-25 DIAGNOSIS — E1143 Type 2 diabetes mellitus with diabetic autonomic (poly)neuropathy: Secondary | ICD-10-CM

## 2011-11-25 DIAGNOSIS — M7989 Other specified soft tissue disorders: Secondary | ICD-10-CM

## 2011-11-25 DIAGNOSIS — N182 Chronic kidney disease, stage 2 (mild): Secondary | ICD-10-CM

## 2011-11-25 LAB — GLUCOSE, CAPILLARY

## 2011-11-25 MED ORDER — FUROSEMIDE 20 MG PO TABS: 20.0000 mg | ORAL_TABLET | Freq: Two times a day (BID) | ORAL | Status: DC

## 2011-11-25 MED ORDER — AMLODIPINE BESYLATE 10 MG PO TABS: 10.0000 mg | ORAL_TABLET | Freq: Every day | ORAL | Status: DC

## 2011-11-25 MED ORDER — HYDROCODONE-ACETAMINOPHEN 7.5-750 MG PO TABS: 1.0000 | ORAL_TABLET | Freq: Four times a day (QID) | ORAL | Status: DC | PRN

## 2011-11-25 MED ORDER — METOCLOPRAMIDE HCL 5 MG PO TABS: 5.0000 mg | ORAL_TABLET | Freq: Four times a day (QID) | ORAL | Status: DC

## 2011-11-25 NOTE — Patient Instructions (Addendum)
I have refilled your medications.  Your blood pressure is elevated today. Resume taking your Norvasc (Amlodipine).  You may continue taking the Lasix (Furosemide) for now and we will reassess the need to continue at your next appointment.  I have also changed from percocet to Vicodin for your ankle and knee pain. I will like to see you back in 4 months. We will check your blood work at that time.

## 2011-11-25 NOTE — Progress Notes (Signed)
Subjective:     Patient ID: Wanda Mclaughlin, female   DOB: [birth year], 78 y.o.   MRN: [id number]  HPI  Presents for medicine refill and more pain medicine for her knee pain. States that she ran out of her amlodipine for a least a week.  States that her prior foot swelling had resolved but is now returning since finishing her fluid pill.  Of note, pt HCTZ combo pill was held and she was started on short course of lasix previously.  Review of Systems  Constitutional: Negative for fatigue.  Eyes: Negative for visual disturbance.  Respiratory: Negative for shortness of breath.   Cardiovascular: Negative for chest pain.  Musculoskeletal: Positive for arthralgias.       Left knee and ankle  Neurological: Negative for syncope, weakness and headaches.       Objective:   Physical Exam  Constitutional: She is oriented to person, place, and time. She appears well-developed and well-nourished. No distress.  HENT:  Head: Normocephalic and atraumatic.  Neck: Normal range of motion. Neck supple.  Cardiovascular: Normal rate, regular rhythm and normal heart sounds.   Pulmonary/Chest: Effort normal and breath sounds normal.  Abdominal: Soft. Bowel sounds are normal.  Musculoskeletal: Normal range of motion. She exhibits edema.       Left knee: tenderness found.       Left ankle: She exhibits swelling. tenderness.  Neurological: She is alert and oriented to person, place, and time. She has normal reflexes.  Skin: Skin is warm and dry.  Psychiatric: She has a normal mood and affect.       Assessment:     1.  Left foot and knee tenderness and swelling: chronic knee pain 2. DM, II: cont metformin 500 mg bid 3. Gastroparesis: change Reglan to 5 mg pill so she doesn't have to split 10 mg pill in half, continue senakot 4. Chronic renal disease: will get BMET on f/u since restarting Lasix 4. Hypertension: no changes to regimen today, will refill amlodipine    Plan:     See problem list

## 2011-11-27 DIAGNOSIS — R1110 Vomiting, unspecified: Secondary | ICD-10-CM | POA: Insufficient documentation

## 2011-11-27 DIAGNOSIS — IMO0002 Reserved for concepts with insufficient information to code with codable children: Secondary | ICD-10-CM | POA: Insufficient documentation

## 2011-11-27 NOTE — Assessment & Plan Note (Signed)
Will check bmet at next visit since resuming Lasix

## 2011-11-27 NOTE — Assessment & Plan Note (Signed)
Changed from Percocet to Vicodin for ease of prescribing. Hydrocodone-APA 7.5/500 q6 h prn.

## 2011-11-27 NOTE — Assessment & Plan Note (Signed)
Ran out of amlodipine.  Above goal today.  No changes to regimen, Amlodipine refilled today.

## 2011-11-27 NOTE — Assessment & Plan Note (Signed)
Controlled, last HgbA1c <7 on Metformin 500 mg tid.  Will change to 100mg  in am and 500 mg pm

## 2011-11-27 NOTE — Assessment & Plan Note (Addendum)
Continue Lasix therapy 20 mg bid.

## 2011-11-27 NOTE — Assessment & Plan Note (Addendum)
Followed by Dr. Christella Hartigan (GI). Continue reglan and senakot

## 2011-11-27 NOTE — Assessment & Plan Note (Signed)
Improved with reglan. 

## 2011-12-06 ENCOUNTER — Other Ambulatory Visit: Payer: Self-pay | Admitting: *Deleted

## 2011-12-09 MED ORDER — CALCIUM CARBONATE-VITAMIN D 500-200 MG-UNIT PO TABS: 1.0000 | ORAL_TABLET | Freq: Three times a day (TID) | ORAL | Status: DC

## 2012-01-13 ENCOUNTER — Other Ambulatory Visit: Payer: Self-pay | Admitting: Internal Medicine

## 2012-01-16 ENCOUNTER — Other Ambulatory Visit: Payer: Self-pay | Admitting: *Deleted

## 2012-01-16 DIAGNOSIS — M25569 Pain in unspecified knee: Secondary | ICD-10-CM

## 2012-01-16 MED ORDER — HYDROCODONE-ACETAMINOPHEN 7.5-750 MG PO TABS: 1.0000 | ORAL_TABLET | Freq: Four times a day (QID) | ORAL | Status: DC | PRN

## 2012-01-16 NOTE — Telephone Encounter (Signed)
Talked with pharmacy and states pt just got Rx on same med 01/13/12 - pharmacy will save this Rx for in 30 days.

## 2012-02-27 ENCOUNTER — Other Ambulatory Visit: Payer: Self-pay | Admitting: Internal Medicine

## 2012-03-23 ENCOUNTER — Ambulatory Visit (INDEPENDENT_AMBULATORY_CARE_PROVIDER_SITE_OTHER): Payer: PRIVATE HEALTH INSURANCE | Admitting: Internal Medicine

## 2012-03-23 ENCOUNTER — Encounter: Payer: Self-pay | Admitting: Internal Medicine

## 2012-03-23 VITALS — BP 135/78 | HR 70 | Temp 98.7°F | Ht 63.0 in | Wt 189.7 lb

## 2012-03-23 DIAGNOSIS — I10 Essential (primary) hypertension: Secondary | ICD-10-CM

## 2012-03-23 DIAGNOSIS — E1143 Type 2 diabetes mellitus with diabetic autonomic (poly)neuropathy: Secondary | ICD-10-CM

## 2012-03-23 DIAGNOSIS — E119 Type 2 diabetes mellitus without complications: Secondary | ICD-10-CM

## 2012-03-23 DIAGNOSIS — E785 Hyperlipidemia, unspecified: Secondary | ICD-10-CM

## 2012-03-23 DIAGNOSIS — Z79899 Other long term (current) drug therapy: Secondary | ICD-10-CM

## 2012-03-23 DIAGNOSIS — M1990 Unspecified osteoarthritis, unspecified site: Secondary | ICD-10-CM

## 2012-03-23 LAB — GLUCOSE, CAPILLARY: Glucose-Capillary: 128 mg/dL — ABNORMAL HIGH (ref 70–99)

## 2012-03-23 MED ORDER — METOCLOPRAMIDE HCL 5 MG PO TABS: 5.0000 mg | ORAL_TABLET | Freq: Four times a day (QID) | ORAL | Status: DC

## 2012-03-23 NOTE — Progress Notes (Signed)
  Subjective:    Patient ID: Wanda Mclaughlin, female    DOB: [birth year], 78 y.o.   MRN: [id number]  HPI Returns to clinic for refill of hydrocodone-acetaminophen.  Patient states that she has been trying to get the medication refilled but has had trouble with the pharmacy thus has been without it for over a month.  She has brought in the empty pill bottle which actually has a different Dr. Bosie Clos as the prescriber.  It is likely that the pharmacy refill requests were going to another doctors office.  She is without complaints today with exception of aches from her arthritis and being without her pain medication.  Review of Systems As per HPI    Objective:   Physical Exam  Constitutional: She is oriented to person, place, and time. She appears well-developed and well-nourished. No distress.       Daughter present with patient  HENT:  Head: Normocephalic and atraumatic.  Neck: Normal range of motion. Neck supple.  Cardiovascular: Normal rate, regular rhythm, normal heart sounds and intact distal pulses.   No murmur heard. Pulmonary/Chest: Effort normal and breath sounds normal. She has no wheezes. She has no rales.  Abdominal: Soft. Bowel sounds are normal.  Neurological: She is alert and oriented to person, place, and time.  Skin: Skin is warm and dry.  Psychiatric: She has a normal mood and affect.          Assessment & Plan:  1. Diabetes Mellitus, Type 2 with peripheral neuropathy and gastroparesisi : good control with HGBA1c 5.5 on Metformin 500 mg bid, gabapentin 600 mg tid and metoclopramide. I discussed with Wanda Mclaughlin that we would actually like to have her HgbA1c a little closer to 6.0.  We have titrated down from 1000 mg bid and HgbA1c still low.  Will continue to monitor and reassess at next visit.  There is the question of her neuropathy and gastroparesis with such a low HgbA1c. -check urine microalbumin today -refilled metoclopramide  2. Hypertension: slightly above goal  today on amlodipine 10 mg qd, atenolol 50 mg qd and lasix 20 mg qd, cutrrently taking potassium supplements -check creatinine level today -check potassium level today -recheck bp--> 135/78  3. Hyperlipidemia: managed with atorvastatin 10 mg qd -check lipid panel today  4. Degenerative Joint Disease: stable -refillef hydrocodone 7.5-750, discussed with patient upcoming need to change prescription dosage giving APAP 325 mg limitation to begin in Jan 2014 -called pharmacy to correct prescriber name

## 2012-03-23 NOTE — Assessment & Plan Note (Signed)
I would like to continue decreasing Metformin dosage given HgbA1c 5.5 today. We reassess stopping Metformin or going to qd dosing if she is hesitant to stop therapy at next visit.

## 2012-03-24 LAB — LIPID PANEL
HDL: 65 mg/dL (ref >39)
LDL Cholesterol: 63 mg/dL (ref 0–99)
Total CHOL/HDL Ratio: 2.4 Ratio
Triglycerides: 147 mg/dL (ref <150)
VLDL: 29 mg/dL (ref 0–40)

## 2012-03-24 LAB — BASIC METABOLIC PANEL
BUN: 15 mg/dL (ref 6–23)
CO2: 25 mEq/L (ref 19–32)
Calcium: 9.7 mg/dL (ref 8.4–10.5)
Chloride: 102 mEq/L (ref 96–112)
Creat: 1.12 mg/dL — ABNORMAL HIGH (ref 0.50–1.10)
Glucose, Bld: 132 mg/dL — ABNORMAL HIGH (ref 70–99)

## 2012-04-08 ENCOUNTER — Other Ambulatory Visit: Payer: Self-pay | Admitting: Internal Medicine

## 2012-04-16 ENCOUNTER — Other Ambulatory Visit: Payer: Self-pay | Admitting: Ophthalmology

## 2012-04-30 ENCOUNTER — Other Ambulatory Visit: Payer: Self-pay | Admitting: Internal Medicine

## 2012-04-30 DIAGNOSIS — M25569 Pain in unspecified knee: Secondary | ICD-10-CM

## 2012-04-30 MED ORDER — HYDROCODONE-ACETAMINOPHEN 7.5-325 MG PO TABS: 1.0000 | ORAL_TABLET | Freq: Four times a day (QID) | ORAL | Status: DC | PRN

## 2012-05-01 ENCOUNTER — Other Ambulatory Visit: Payer: Self-pay | Admitting: Internal Medicine

## 2012-05-01 NOTE — Telephone Encounter (Signed)
I already changed the hydrocodone to 7.5-325.  If the pharmacy did not receive it, please phone it in.

## 2012-05-05 NOTE — Telephone Encounter (Signed)
Called to pharm, it will be ready 1/8 due to shipments to [facility]

## 2012-05-14 ENCOUNTER — Other Ambulatory Visit: Payer: Self-pay | Admitting: Internal Medicine

## 2012-07-16 ENCOUNTER — Other Ambulatory Visit: Payer: Self-pay | Admitting: *Deleted

## 2012-07-17 MED ORDER — FERROUS SULFATE 325 (65 FE) MG PO TABS: ORAL_TABLET | ORAL | Status: DC

## 2012-08-04 ENCOUNTER — Other Ambulatory Visit: Payer: Self-pay | Admitting: *Deleted

## 2012-08-04 DIAGNOSIS — E785 Hyperlipidemia, unspecified: Secondary | ICD-10-CM

## 2012-08-04 DIAGNOSIS — I10 Essential (primary) hypertension: Secondary | ICD-10-CM

## 2012-08-04 DIAGNOSIS — K2970 Gastritis, unspecified, without bleeding: Secondary | ICD-10-CM

## 2012-08-04 MED ORDER — ATENOLOL 50 MG PO TABS: 50.0000 mg | ORAL_TABLET | Freq: Every day | ORAL | Status: DC

## 2012-08-04 MED ORDER — OMEPRAZOLE 20 MG PO CPDR: 20.0000 mg | DELAYED_RELEASE_CAPSULE | Freq: Every day | ORAL | Status: DC

## 2012-08-04 MED ORDER — ATORVASTATIN CALCIUM 10 MG PO TABS: 10.0000 mg | ORAL_TABLET | Freq: Every day | ORAL | Status: DC

## 2012-08-10 ENCOUNTER — Other Ambulatory Visit: Payer: Self-pay | Admitting: Internal Medicine

## 2012-11-02 ENCOUNTER — Ambulatory Visit (INDEPENDENT_AMBULATORY_CARE_PROVIDER_SITE_OTHER): Payer: PRIVATE HEALTH INSURANCE | Admitting: Internal Medicine

## 2012-11-02 ENCOUNTER — Encounter: Payer: Self-pay | Admitting: Internal Medicine

## 2012-11-02 VITALS — BP 156/85 | HR 62 | Temp 97.4°F | Ht 62.5 in | Wt 194.6 lb

## 2012-11-02 DIAGNOSIS — N182 Chronic kidney disease, stage 2 (mild): Secondary | ICD-10-CM

## 2012-11-02 DIAGNOSIS — K3184 Gastroparesis: Secondary | ICD-10-CM

## 2012-11-02 DIAGNOSIS — E1143 Type 2 diabetes mellitus with diabetic autonomic (poly)neuropathy: Secondary | ICD-10-CM

## 2012-11-02 DIAGNOSIS — R600 Localized edema: Secondary | ICD-10-CM | POA: Insufficient documentation

## 2012-11-02 DIAGNOSIS — M25569 Pain in unspecified knee: Secondary | ICD-10-CM

## 2012-11-02 DIAGNOSIS — M1990 Unspecified osteoarthritis, unspecified site: Secondary | ICD-10-CM

## 2012-11-02 DIAGNOSIS — E1149 Type 2 diabetes mellitus with other diabetic neurological complication: Secondary | ICD-10-CM

## 2012-11-02 DIAGNOSIS — I10 Essential (primary) hypertension: Secondary | ICD-10-CM

## 2012-11-02 DIAGNOSIS — I129 Hypertensive chronic kidney disease with stage 1 through stage 4 chronic kidney disease, or unspecified chronic kidney disease: Secondary | ICD-10-CM

## 2012-11-02 DIAGNOSIS — R609 Edema, unspecified: Secondary | ICD-10-CM

## 2012-11-02 DIAGNOSIS — E119 Type 2 diabetes mellitus without complications: Secondary | ICD-10-CM

## 2012-11-02 DIAGNOSIS — E785 Hyperlipidemia, unspecified: Secondary | ICD-10-CM

## 2012-11-02 LAB — POCT GLYCOSYLATED HEMOGLOBIN (HGB A1C): Hemoglobin A1C: 6.1

## 2012-11-02 MED ORDER — HYDROCODONE-ACETAMINOPHEN 7.5-325 MG PO TABS: 1.0000 | ORAL_TABLET | Freq: Four times a day (QID) | ORAL | Status: DC | PRN

## 2012-11-02 MED ORDER — FUROSEMIDE 40 MG PO TABS: 40.0000 mg | ORAL_TABLET | Freq: Every morning | ORAL | Status: DC

## 2012-11-02 MED ORDER — AMLODIPINE BESYLATE 10 MG PO TABS: 10.0000 mg | ORAL_TABLET | Freq: Every day | ORAL | Status: DC

## 2012-11-02 MED ORDER — GABAPENTIN 600 MG PO TABS: ORAL_TABLET | ORAL | Status: DC

## 2012-11-02 MED ORDER — METFORMIN HCL 500 MG PO TABS: ORAL_TABLET | ORAL | Status: DC

## 2012-11-02 NOTE — Assessment & Plan Note (Addendum)
Not on ACEi due to angioedema.  Declines ARB therapy. Last urine mirco alb: creat ratio ~55

## 2012-11-02 NOTE — Assessment & Plan Note (Addendum)
Stable on neurontin 600 mg tid

## 2012-11-02 NOTE — Assessment & Plan Note (Signed)
Stable on metoclopramide.  Followed by Dr. Charlott Rakes at [facility]

## 2012-11-02 NOTE — Assessment & Plan Note (Signed)
BP Readings from Last 3 Encounters:  11/02/12 156/85  03/23/12 135/78  11/25/11 166/79    Lab Results  Component Value Date   NA 138 03/23/2012   K 3.8 03/23/2012   CREATININE 1.12* 03/23/2012    Assessment: Blood pressure control: controlled Progress toward BP goal:  at goal Comments: allergy to lisinopril and pt declines ARB  Plan: Medications:  continue current medications Educational resources provided: brochure Self management tools provided:   Other plans: will change Lasix to 40mg  in morning due to frequent nighttime urination

## 2012-11-02 NOTE — Patient Instructions (Addendum)
General Instructions:  Your diabetes and blood pressure look good today.  We will not make any changes besides the fluid pill. Take 2 tabs in the morning which will equal 40 mg.  When you have finished that bottle, the refill will be for 40 mg and you will only need to take 1 pill in the morning (40 mg). We have refilled several medications including your pain medicine for your knee pain and given you compression stockings for the leg swelling. If you get worsening shortness of breath of develop chest pains, please call the clinic for assistance at [phone] or go to the ED. As we discussed, we will not continue with the mammograms.  If you notice any changes in your breast, please call the clinic for an appointment. Return to see me in 4 months.  We will check your cholesterol at that time.  Treatment Goals:  Goals (1 Years of Data) as of 11/02/12         As of Today 03/23/12 03/23/12 11/25/11 10/23/11     Blood Pressure    . Blood Pressure < 160/90  156/85 135/78 153/78 166/79 140/85      Progress Toward Treatment Goals:  Treatment Goal 11/02/2012  Hemoglobin A1C at goal  Blood pressure at goal    Self Care Goals & Plans:  Self Care Goal 11/02/2012  Manage my medications take my medicines as prescribed; bring my medications to every visit; refill my medications on time  Monitor my health keep track of my blood glucose; bring my glucose meter and log to each visit    Home Blood Glucose Monitoring 11/02/2012  Check my blood sugar once a day  When to check my blood sugar before breakfast     Care Management & Community Referrals:

## 2012-11-02 NOTE — Assessment & Plan Note (Signed)
Stable 1+ pitting edema which worsens throughout the day and resolves when she has a chance to put her legs up.  States Lasix helps somewhat but not a lot.  Hesitant to increase dosage as pt complains of nocturia on Lasix 20 mg bid.  Will change to 40 mg Lasix q am and reassess at next visit. No calf pain or signs of DVT.  No chest pain, no increased sob but pt states that she has sob sometimes on exertion.

## 2012-11-02 NOTE — Assessment & Plan Note (Signed)
At goal LDL and total cholesterol lipitor 10 mg qd. Next lipid panel due 02/2013.  Will assess for need to increase at next follow-up.  Lipid Panel     Component Value Date/Time   CHOL 157 03/23/2012 1406   TRIG 147 03/23/2012 1406   HDL 65 03/23/2012 1406   CHOLHDL 2.4 03/23/2012 1406   VLDL 29 03/23/2012 1406   LDLCALC 63 03/23/2012 1406

## 2012-11-02 NOTE — Assessment & Plan Note (Addendum)
Lab Results  Component Value Date   HGBA1C 6.1 11/02/2012   HGBA1C 5.5 03/23/2012   HGBA1C 5.9 10/09/2011     Assessment: Diabetes control: good control (HgbA1C at goal) Progress toward A1C goal:  at goal Comments: HgbA1 increased from low of 5.1 with downward titration of metformin from 1000 mg bid to 500 mg bid  Plan: Medications:  continue current medications Home glucose monitoring: Frequency: once a day Timing: before breakfast Instruction/counseling given: reminded to get eye exam, reminded to bring blood glucose meter & log to each visit, reminded to bring medications to each visit and discussed foot care Educational resources provided: brochure Self management tools provided: copy of home glucose meter download Other plans: continue Metformin 500 mg bid, foot check today

## 2012-11-02 NOTE — Progress Notes (Signed)
  Subjective:    Patient ID: Wanda Mclaughlin, female    DOB: Apr 23, 1929, 79 y.o.   MRN: [id number]  HPI Pt presents for f/u diabetes, hypertension, diabetic neuropathy, and degenerative joint disease of the knees. At her last visit the  metformin was decreased from 1000 mg bid to 500 mg bid given her then low HgbA1c of 5.1 which may predispose her to stroke. Today her HgbA1c is 6.1.  She states that she is compliant with all meds and in need of refills for hydrocodone. Denies chest pain, increased shortness of breath above her baseline mild dyspnea on exertion.  She is concerned about her nighttime urination frequency but does not want to stop the Lasix for her lower extremity edema.  States that she still has bilateral knee pain and takes ~3 hydrocodone per day.   Review of Systems  Constitutional: Negative for fever and fatigue.  HENT: Negative for congestion.   Respiratory: Positive for shortness of breath. Negative for cough.   Cardiovascular: Positive for leg swelling. Negative for chest pain and palpitations.  Gastrointestinal: Positive for constipation. Negative for abdominal pain, diarrhea and blood in stool.  Genitourinary: Negative for dysuria, hematuria, flank pain, difficulty urinating and pelvic pain.       Nocturia  Musculoskeletal: Positive for arthralgias. Negative for myalgias, joint swelling and gait problem.  Skin: Negative for rash.  Neurological: Negative for weakness, light-headedness and headaches.  Psychiatric/Behavioral: Negative for confusion.       Objective:   Physical Exam  Constitutional: She is oriented to person, place, and time. She appears well-developed and well-nourished. No distress.  Pleasant elderly female with daughter present  HENT:  Head: Normocephalic and atraumatic.  Eyes: Conjunctivae and EOM are normal. Pupils are equal, round, and reactive to light.  Cardiovascular: Normal rate, regular rhythm, normal heart sounds and intact distal pulses.    Pulmonary/Chest: Effort normal and breath sounds normal. No respiratory distress. She has no wheezes.  Abdominal: Soft. Bowel sounds are normal. She exhibits no distension. There is no tenderness.  Musculoskeletal: She exhibits edema. She exhibits no tenderness.       Right lower leg: She exhibits edema.       Left lower leg: She exhibits edema.  Neurological: She is alert and oriented to person, place, and time.  Skin: Skin is warm and dry. No erythema.  Psychiatric: She has a normal mood and affect.          Assessment & Plan:  See detailed problem oriented charting 1. Htn: stable on amlodipine, atenolol, lasix 2. DM with neuropathy: controlled, HgbA1c 6.1, cont Metformin and Gabapentin 3. Gastroparesis: stable on metoclopramide 4. LE edema: prescribed compression hose, change lasix to 40 mg qam 5: DJD: of the knees, stable, refilled hydrocodone 6. WUJ:WJXBJY,  allergy to ACEi, declines ARB

## 2012-11-03 NOTE — Progress Notes (Signed)
Case discussed with Dr. Schooler soon after the resident saw the patient.  We reviewed the resident's history and exam and pertinent patient test results.  I agree with the assessment, diagnosis, and plan of care documented in the resident's note. 

## 2012-12-08 ENCOUNTER — Other Ambulatory Visit: Payer: Self-pay | Admitting: Internal Medicine

## 2013-02-20 ENCOUNTER — Other Ambulatory Visit: Payer: Self-pay | Admitting: Internal Medicine

## 2013-02-20 DIAGNOSIS — K2970 Gastritis, unspecified, without bleeding: Secondary | ICD-10-CM

## 2013-02-20 DIAGNOSIS — I10 Essential (primary) hypertension: Secondary | ICD-10-CM

## 2013-02-20 DIAGNOSIS — E785 Hyperlipidemia, unspecified: Secondary | ICD-10-CM

## 2013-02-24 NOTE — Telephone Encounter (Signed)
Received call from [facility] today. Thanks

## 2013-03-15 ENCOUNTER — Encounter: Payer: Self-pay | Admitting: Internal Medicine

## 2013-03-15 ENCOUNTER — Ambulatory Visit (INDEPENDENT_AMBULATORY_CARE_PROVIDER_SITE_OTHER): Payer: PRIVATE HEALTH INSURANCE | Admitting: Internal Medicine

## 2013-03-15 ENCOUNTER — Other Ambulatory Visit: Payer: PRIVATE HEALTH INSURANCE

## 2013-03-15 VITALS — BP 140/77 | HR 64 | Temp 99.8°F | Ht 62.25 in | Wt 188.5 lb

## 2013-03-15 DIAGNOSIS — E785 Hyperlipidemia, unspecified: Secondary | ICD-10-CM

## 2013-03-15 DIAGNOSIS — E1149 Type 2 diabetes mellitus with other diabetic neurological complication: Secondary | ICD-10-CM

## 2013-03-15 DIAGNOSIS — E1142 Type 2 diabetes mellitus with diabetic polyneuropathy: Secondary | ICD-10-CM

## 2013-03-15 DIAGNOSIS — J029 Acute pharyngitis, unspecified: Secondary | ICD-10-CM

## 2013-03-15 DIAGNOSIS — R059 Cough, unspecified: Secondary | ICD-10-CM

## 2013-03-15 DIAGNOSIS — M1990 Unspecified osteoarthritis, unspecified site: Secondary | ICD-10-CM

## 2013-03-15 DIAGNOSIS — J069 Acute upper respiratory infection, unspecified: Secondary | ICD-10-CM

## 2013-03-15 DIAGNOSIS — I10 Essential (primary) hypertension: Secondary | ICD-10-CM

## 2013-03-15 DIAGNOSIS — E1140 Type 2 diabetes mellitus with diabetic neuropathy, unspecified: Secondary | ICD-10-CM

## 2013-03-15 DIAGNOSIS — E1143 Type 2 diabetes mellitus with diabetic autonomic (poly)neuropathy: Secondary | ICD-10-CM

## 2013-03-15 DIAGNOSIS — J020 Streptococcal pharyngitis: Secondary | ICD-10-CM | POA: Insufficient documentation

## 2013-03-15 DIAGNOSIS — R05 Cough: Secondary | ICD-10-CM

## 2013-03-15 DIAGNOSIS — M25569 Pain in unspecified knee: Secondary | ICD-10-CM

## 2013-03-15 DIAGNOSIS — K3184 Gastroparesis: Secondary | ICD-10-CM

## 2013-03-15 DIAGNOSIS — Z0000 Encounter for general adult medical examination without abnormal findings: Secondary | ICD-10-CM

## 2013-03-15 DIAGNOSIS — J310 Chronic rhinitis: Secondary | ICD-10-CM

## 2013-03-15 DIAGNOSIS — N182 Chronic kidney disease, stage 2 (mild): Secondary | ICD-10-CM

## 2013-03-15 HISTORY — DX: Encounter for general adult medical examination without abnormal findings: Z00.00

## 2013-03-15 LAB — LIPID PANEL
Cholesterol: 154 mg/dL (ref 0–200)
HDL: 52 mg/dL (ref >39)
LDL Cholesterol: 79 mg/dL (ref 0–99)
Total CHOL/HDL Ratio: 3 Ratio
Triglycerides: 115 mg/dL (ref <150)
VLDL: 23 mg/dL (ref 0–40)

## 2013-03-15 LAB — BASIC METABOLIC PANEL
BUN: 17 mg/dL (ref 6–23)
CO2: 27 mEq/L (ref 19–32)
Chloride: 102 mEq/L (ref 96–112)
Creat: 1.27 mg/dL — ABNORMAL HIGH (ref 0.50–1.10)
Potassium: 4.5 mEq/L (ref 3.5–5.3)

## 2013-03-15 LAB — POCT GLYCOSYLATED HEMOGLOBIN (HGB A1C): Hemoglobin A1C: 6.1

## 2013-03-15 MED ORDER — BENZONATATE 100 MG PO CAPS: 100.0000 mg | ORAL_CAPSULE | Freq: Three times a day (TID) | ORAL | Status: DC | PRN

## 2013-03-15 MED ORDER — HYDROCODONE-ACETAMINOPHEN 7.5-325 MG PO TABS: 1.0000 | ORAL_TABLET | Freq: Four times a day (QID) | ORAL | Status: DC | PRN

## 2013-03-15 MED ORDER — ZOSTER VACCINE LIVE 19400 UNT/0.65ML ~~LOC~~ SOLR: 0.6500 mL | Freq: Once | SUBCUTANEOUS | Status: DC

## 2013-03-15 MED ORDER — DEXTROMETHORPHAN HBR 15 MG/5ML PO SYRP: 10.0000 mL | ORAL_SOLUTION | Freq: Four times a day (QID) | ORAL | Status: DC | PRN

## 2013-03-15 MED ORDER — CHLORPHENIRAMINE MALEATE 4 MG PO TABS: 4.0000 mg | ORAL_TABLET | Freq: Two times a day (BID) | ORAL | Status: DC | PRN

## 2013-03-15 NOTE — Assessment & Plan Note (Signed)
BP Readings from Last 3 Encounters:  03/15/13 140/77  11/02/12 156/85  03/23/12 135/78    Lab Results  Component Value Date   NA 138 03/23/2012   K 3.8 03/23/2012   CREATININE 1.12* 03/23/2012    Assessment: Blood pressure control: controlled Progress toward BP goal:  at goal Comments:   Plan: Medications:  continue current medications Educational resources provided: brochure;handout;video Self management tools provided:   Other plans: cont amlodipine 10 mg, atenolol 50 mg qd, and lasix 40 mg qd,

## 2013-03-15 NOTE — Assessment & Plan Note (Signed)
-  check strep throat

## 2013-03-15 NOTE — Assessment & Plan Note (Signed)
DM Tussin

## 2013-03-15 NOTE — Assessment & Plan Note (Signed)
Lab Results  Component Value Date   HGBA1C 6.1 03/15/2013   HGBA1C 6.1 11/02/2012   HGBA1C 5.5 03/23/2012     Assessment: Diabetes control: good control (HgbA1C at goal) Progress toward A1C goal:  at goal Comments:   Plan: Medications:  continue current medications Home glucose monitoring: Frequency:   Timing:   Instruction/counseling given: discussed foot care and discussed sick day management Educational resources provided: brochure;handout Self management tools provided: copy of home glucose meter download Other plans: cont metformin 500 mg bid

## 2013-03-15 NOTE — Patient Instructions (Signed)
General Instructions: It was nice to see you today. We discussed what all of your medications are for, if you have any other questions please call the clinic. I think that you have a cold virus.  This does not require antibiotics. I will prescribed something for your nasal secretions and cough. We will also check to make sure that you do not have strep throat. If your symptoms do not improve in 1 week please call the clinic. We will check your cholesterol and kidney function today.   Treatment Goals:  Goals (1 Years of Data) as of 03/15/13         As of Today 11/02/12 03/23/12 03/23/12 11/25/11     Blood Pressure    . Blood Pressure < 160/90  140/77 156/85 135/78 153/78 166/79      Progress Toward Treatment Goals:  Treatment Goal 03/15/2013  Hemoglobin A1C at goal  Blood pressure at goal    Self Care Goals & Plans:  Self Care Goal 03/15/2013  Manage my medications take my medicines as prescribed; bring my medications to every visit; refill my medications on time; follow the sick day instructions if I am sick  Monitor my health keep track of my blood glucose; bring my glucose meter and log to each visit; keep track of my weight; check my feet daily  Eat healthy foods eat more vegetables; eat fruit for snacks and desserts; eat baked foods instead of fried foods; eat smaller portions; drink diet soda or water instead of juice or soda  Be physically active find an activity I enjoy    Home Blood Glucose Monitoring 11/02/2012  Check my blood sugar once a day  When to check my blood sugar before breakfast     Care Management & Community Referrals:  No flowsheet data found.

## 2013-03-15 NOTE — Assessment & Plan Note (Signed)
Check bmet today   

## 2013-03-15 NOTE — Assessment & Plan Note (Signed)
Cont gabapentin

## 2013-03-15 NOTE — Progress Notes (Signed)
  Subjective:    Patient ID: Wanda Mclaughlin, female    DOB: [birth year], 79 y.o.   MRN: [id number]  HPI Ms. Blandino presents for diabetes mellitus, hypertension follow-up.  In addition she reports that she has has a runny nose and cough for the past 4 days now productive of yellowish, greenish phlegm at times.. States that her daughter has had a lingering cough as well. Denies shortness of breath, chest pain, abdominal pain, headaches.  States that she has had some chest pain "some time ago" but nothing recently.  Denies fever or sweats. States that she has been compliant with her medications just doesn't know "what its all for".   Review of Systems  Constitutional: Negative for fever, chills and fatigue.  HENT: Positive for hearing loss, postnasal drip and rhinorrhea. Negative for congestion, ear pain and sinus pressure.        Left hearing aid  Eyes: Negative for visual disturbance.  Respiratory: Positive for cough. Negative for chest tightness and shortness of breath.   Cardiovascular: Negative for chest pain and palpitations.  Endocrine: Negative.   Genitourinary: Negative for dysuria and hematuria.  Allergic/Immunologic: Negative for environmental allergies.  Neurological: Negative for weakness, light-headedness and headaches.  Hematological: Negative.   Psychiatric/Behavioral: Negative.        Objective:   Physical Exam  Constitutional: She is oriented to person, place, and time. She appears well-developed and well-nourished. No distress.  Elderly AA female, with daughter present  HENT:  Head: Normocephalic and atraumatic.  Right Ear: Tympanic membrane, external ear and ear canal normal.  Left Ear: External ear normal.  Nose: Mucosal edema and rhinorrhea present. No sinus tenderness. Right sinus exhibits no maxillary sinus tenderness and no frontal sinus tenderness. Left sinus exhibits no maxillary sinus tenderness and no frontal sinus tenderness.  Mouth/Throat: Oropharynx is clear  and moist and mucous membranes are normal.  Left hearing aid in place, +post-nasal drainage (clear)  Eyes: Conjunctivae and EOM are normal. Pupils are equal, round, and reactive to light.  Neck: Neck supple. No thyromegaly present.  Cardiovascular: Normal rate, regular rhythm, normal heart sounds and intact distal pulses.   No murmur heard. Pulmonary/Chest: Effort normal and breath sounds normal. No respiratory distress. She has no wheezes. She has no rales.  Abdominal: Soft. Bowel sounds are normal.  Musculoskeletal: She exhibits tenderness.       Right knee: Tenderness found.       Left knee: Tenderness found.  Neurological: She is alert and oriented to person, place, and time.  Skin: Skin is warm and dry.  Psychiatric: She has a normal mood and affect. Her behavior is normal. Judgment and thought content normal.          Assessment & Plan:  See separate problem-based charting:  #1 Diabetes mellitus with neuropathy and gastroparesis, controlled: HgbA1c 6.1 on metformin 500 mg bid -cont metoclopramide and gabapentin -check urine microalbumin  #2 Hypertension: controlled on amlodipine, atenolol, furosemide  #3 cough and rhinitis: likely viral, will manage symptomatically -Tessalon, Tussin, chlopheniramine -check rapid strep  #4 Health Maintenance: received flu shot at pharmacy, Eye check Oct 2014 [facility]), RX givne for Shingles vaccine -reviewed all medications and wrote indication on each bottle for pt and daughter  #5 Hyperlipiedemia: check lipid panel

## 2013-03-15 NOTE — Assessment & Plan Note (Signed)
Zostavax Rx given

## 2013-03-15 NOTE — Assessment & Plan Note (Signed)
Renewed hydrocodone 7.5/325 mg q6h prn #120

## 2013-03-15 NOTE — Assessment & Plan Note (Signed)
-  likely secondary to viral URI -chlorphenaramine

## 2013-03-16 LAB — MICROALBUMIN / CREATININE URINE RATIO: Creatinine, Urine: 386.9 mg/dL

## 2013-03-16 LAB — RAPID STREP SCREEN (MED CTR MEBANE ONLY): Streptococcus, Group A Screen (Direct): POSITIVE — AB

## 2013-03-17 ENCOUNTER — Other Ambulatory Visit: Payer: Self-pay | Admitting: Internal Medicine

## 2013-03-17 DIAGNOSIS — J029 Acute pharyngitis, unspecified: Secondary | ICD-10-CM

## 2013-03-17 MED ORDER — AMOXICILLIN 500 MG PO TABS: 500.0000 mg | ORAL_TABLET | Freq: Two times a day (BID) | ORAL | Status: DC

## 2013-03-17 NOTE — Progress Notes (Signed)
Message left on home phone recording about throat culture and med at pharmacy per Dr Bosie Clos.

## 2013-03-17 NOTE — Assessment & Plan Note (Signed)
Positive Strep throat screen ---> amoxicillin 500 mg qd x 10days

## 2013-03-18 NOTE — Progress Notes (Signed)
Case discussed with Dr. Schooler soon after the resident saw the patient.  We reviewed the resident's history and exam and pertinent patient test results.  I agree with the assessment, diagnosis, and plan of care documented in the resident's note. 

## 2013-04-14 ENCOUNTER — Other Ambulatory Visit: Payer: Self-pay | Admitting: Internal Medicine

## 2013-04-30 ENCOUNTER — Other Ambulatory Visit: Payer: Self-pay | Admitting: *Deleted

## 2013-04-30 DIAGNOSIS — M25569 Pain in unspecified knee: Secondary | ICD-10-CM

## 2013-04-30 DIAGNOSIS — M1990 Unspecified osteoarthritis, unspecified site: Secondary | ICD-10-CM

## 2013-04-30 MED ORDER — HYDROCODONE-ACETAMINOPHEN 7.5-325 MG PO TABS: 1.0000 | ORAL_TABLET | Freq: Four times a day (QID) | ORAL | Status: DC | PRN

## 2013-05-26 ENCOUNTER — Other Ambulatory Visit: Payer: Self-pay | Admitting: Internal Medicine

## 2013-05-31 ENCOUNTER — Other Ambulatory Visit: Payer: Self-pay | Admitting: Internal Medicine

## 2013-06-03 ENCOUNTER — Other Ambulatory Visit: Payer: Self-pay | Admitting: *Deleted

## 2013-06-03 DIAGNOSIS — M1990 Unspecified osteoarthritis, unspecified site: Secondary | ICD-10-CM

## 2013-06-03 MED ORDER — HYDROCODONE-ACETAMINOPHEN 7.5-325 MG PO TABS: 1.0000 | ORAL_TABLET | Freq: Four times a day (QID) | ORAL | Status: DC | PRN

## 2013-06-03 NOTE — Telephone Encounter (Signed)
Call pt's daughter when ready @ [phone].

## 2013-06-04 NOTE — Telephone Encounter (Signed)
Rx ready for pick up; pt/daughter notified.

## 2013-06-12 ENCOUNTER — Other Ambulatory Visit: Payer: Self-pay | Admitting: Internal Medicine

## 2013-07-07 ENCOUNTER — Other Ambulatory Visit: Payer: Self-pay | Admitting: *Deleted

## 2013-07-07 DIAGNOSIS — M1990 Unspecified osteoarthritis, unspecified site: Secondary | ICD-10-CM

## 2013-07-08 MED ORDER — HYDROCODONE-ACETAMINOPHEN 7.5-325 MG PO TABS: 1.0000 | ORAL_TABLET | Freq: Four times a day (QID) | ORAL | Status: DC | PRN

## 2013-07-08 NOTE — Telephone Encounter (Signed)
P/t's daughter informed

## 2013-07-26 ENCOUNTER — Ambulatory Visit (INDEPENDENT_AMBULATORY_CARE_PROVIDER_SITE_OTHER): Payer: PRIVATE HEALTH INSURANCE | Admitting: Internal Medicine

## 2013-07-26 ENCOUNTER — Encounter: Payer: Self-pay | Admitting: Internal Medicine

## 2013-07-26 VITALS — BP 155/77 | HR 72 | Temp 98.7°F | Ht 62.0 in | Wt 184.3 lb

## 2013-07-26 DIAGNOSIS — D649 Anemia, unspecified: Secondary | ICD-10-CM

## 2013-07-26 DIAGNOSIS — E1142 Type 2 diabetes mellitus with diabetic polyneuropathy: Secondary | ICD-10-CM

## 2013-07-26 DIAGNOSIS — E1149 Type 2 diabetes mellitus with other diabetic neurological complication: Secondary | ICD-10-CM

## 2013-07-26 DIAGNOSIS — M810 Age-related osteoporosis without current pathological fracture: Secondary | ICD-10-CM

## 2013-07-26 DIAGNOSIS — E1140 Type 2 diabetes mellitus with diabetic neuropathy, unspecified: Secondary | ICD-10-CM

## 2013-07-26 DIAGNOSIS — I10 Essential (primary) hypertension: Secondary | ICD-10-CM

## 2013-07-26 DIAGNOSIS — M25569 Pain in unspecified knee: Secondary | ICD-10-CM

## 2013-07-26 DIAGNOSIS — M1990 Unspecified osteoarthritis, unspecified site: Secondary | ICD-10-CM

## 2013-07-26 LAB — CBC
HCT: 34.5 % — ABNORMAL LOW (ref 36.0–46.0)
Hemoglobin: 11.5 g/dL — ABNORMAL LOW (ref 12.0–15.0)
MCH: 30.7 pg (ref 26.0–34.0)
MCHC: 33.3 g/dL (ref 30.0–36.0)
MCV: 92 fL (ref 78.0–100.0)
PLATELETS: 236 10*3/uL (ref 150–400)
RBC: 3.75 MIL/uL — AB (ref 3.87–5.11)
RDW: 14.5 % (ref 11.5–15.5)
WBC: 5.5 10*3/uL (ref 4.0–10.5)

## 2013-07-26 LAB — POCT GLYCOSYLATED HEMOGLOBIN (HGB A1C): Hemoglobin A1C: 6

## 2013-07-26 LAB — GLUCOSE, CAPILLARY: GLUCOSE-CAPILLARY: 112 mg/dL — AB (ref 70–99)

## 2013-07-26 MED ORDER — HYDROCODONE-ACETAMINOPHEN 7.5-325 MG PO TABS: 1.0000 | ORAL_TABLET | Freq: Four times a day (QID) | ORAL | Status: DC | PRN

## 2013-07-26 NOTE — Progress Notes (Signed)
   Subjective:    Patient ID: Wanda HumphreysHazel L Moor, female    DOB: 02/20/1929, 80 y.o.   MRN: [id number]  HPI Pt presents for diabetes, hypertension and chronic knee pain f/u.  Would like referral to Ortho surgeon about her left knee pain for alternatives to a knee replacement. States that her left knee has continued to "bother" her with pain and popping.  States the right knee pops as well but does not hurt to the extent of the left.  Also reports intermittent swelling of the knee if she has been walk a great distance.  Using a cane at present but does not want a "big knee surgery".   Review of Systems  Constitutional: Positive for fatigue.       "tired sometimes"  HENT: Negative.   Eyes: Negative.   Respiratory: Negative.   Cardiovascular: Negative.   Gastrointestinal: Negative.   Genitourinary: Negative.   Musculoskeletal: Positive for arthralgias, gait problem and joint swelling.       Both knees "pop" but left hurts and swells at times.  Neurological: Negative.   Hematological: Negative.   Psychiatric/Behavioral: Negative.        Objective:   Physical Exam  Constitutional: She is oriented to person, place, and time. She appears well-developed and well-nourished. No distress.  Daughter Annice PihJackie present  HENT:  Head: Normocephalic and atraumatic.  Eyes: Conjunctivae and EOM are normal. Pupils are equal, round, and reactive to light.  Neck: Normal range of motion. Neck supple. No thyromegaly present.  Cardiovascular: Normal rate, regular rhythm, normal heart sounds and intact distal pulses.   Pulmonary/Chest: Effort normal and breath sounds normal.  Abdominal: Soft. Bowel sounds are normal.  Neurological: She is alert and oriented to person, place, and time.  Skin: Skin is warm and dry.  Psychiatric: She has a normal mood and affect.          Assessment & Plan:  See separate problem-lis charting for detailed A/P.  -referral to Ortho once Medicaid card PCP name is  corrected -cbc today r/o anemia causing fatigue -f/u 3 months DM check

## 2013-07-26 NOTE — Patient Instructions (Signed)
General Instructions:  We will refer you to the Knee doctor when you get your Medicaid Card name corrected. We will check your blood work today. Thank you for bringing your medicines today. This helps us keep you safe from mistakes.   Treatment Goals:  Goals (1 Years of Data) as of 07/26/13         As of Today 03/15/13 11/02/12 03/23/12     Blood Pressure    . Blood Pressure < 160/90  155/77 140/77 156/85 135/78      Progress Toward Treatment Goals:  Treatment Goal 07/26/2013  Hemoglobin A1C at goal  Blood pressure at goal    Self Care Goals & Plans:  Self Care Goal 07/26/2013  Manage my medications take my medicines as prescribed; bring my medications to every visit; refill my medications on time; follow the sick day instructions if I am sick  Monitor my health keep track of my blood glucose; bring my glucose meter and log to each visit; keep track of my blood pressure; keep track of my weight; check my feet daily  Eat healthy foods eat more vegetables; eat fruit for snacks and desserts; eat baked foods instead of fried foods; drink diet soda or water instead of juice or soda  Be physically active find an activity I enjoy    Home Blood Glucose Monitoring 07/26/2013  Check my blood sugar 3 times a day  When to check my blood sugar before meals     Care Management & Community Referrals:  No flowsheet data found.

## 2013-07-26 NOTE — Assessment & Plan Note (Signed)
BP Readings from Last 3 Encounters:  07/26/13 155/77  03/15/13 140/77  11/02/12 156/85    Lab Results  Component Value Date   NA 139 03/15/2013   K 4.5 03/15/2013   CREATININE 1.27* 03/15/2013    Assessment: Blood pressure control: controlled Progress toward BP goal:  at goal Comments:   Plan: Medications:  continue current medications Educational resources provided: brochure;handout;video Self management tools provided:   Other plans: cont atenolol 50 mg qd, amlodipine 10 mg qd, and lasix 40 mg qd

## 2013-07-26 NOTE — Assessment & Plan Note (Signed)
Needs Ortho referral -Refill hydrocodone to be filled after August 07, 2013.

## 2013-07-26 NOTE — Assessment & Plan Note (Signed)
States that she sometimes feels "tired for no reason".   -check cbc as pt with h/o anemia some years ago

## 2013-07-26 NOTE — Assessment & Plan Note (Signed)
Lab Results  Component Value Date   HGBA1C 6.0 07/26/2013   HGBA1C 6.1 03/15/2013   HGBA1C 6.1 11/02/2012     Assessment: Diabetes control: good control (HgbA1C at goal) Progress toward A1C goal:  at goal Comments:   Plan: Medications:  continue current medications Home glucose monitoring: Frequency: 3 times a day Timing: before meals Instruction/counseling given: reminded to bring blood glucose meter & log to each visit and reminded to bring medications to each visit Educational resources provided: brochure;handout Self management tools provided: copy of home glucose meter download Other plans: cont metformin 500 mg bid

## 2013-07-26 NOTE — Assessment & Plan Note (Signed)
Last DEXA 2011 indicating osteoporosis.  Pt had been on Fosamax >3 yrs.   -Will cont VitD-Ca

## 2013-07-26 NOTE — Assessment & Plan Note (Signed)
Referral to Ortho once Medicaid Card issue resolved.  Pt adamant that she doesn't want a knee replacement but may want  Other alternatives such as arthroscopy.  Would prefer to go to [facility] where her daughter Real ConsJackie Garret is a pt of Dr. Rogelia MireNitkas.

## 2013-07-29 NOTE — Progress Notes (Signed)
Case discussed with Dr. Schooler soon after the resident saw the patient.  We reviewed the resident's history and exam and pertinent patient test results.  I agree with the assessment, diagnosis, and plan of care documented in the resident's note. 

## 2013-09-06 ENCOUNTER — Other Ambulatory Visit: Payer: Self-pay | Admitting: *Deleted

## 2013-09-06 DIAGNOSIS — M1990 Unspecified osteoarthritis, unspecified site: Secondary | ICD-10-CM

## 2013-09-06 DIAGNOSIS — M25569 Pain in unspecified knee: Secondary | ICD-10-CM

## 2013-09-06 NOTE — Telephone Encounter (Signed)
Call when ready at [phone]

## 2013-09-07 MED ORDER — HYDROCODONE-ACETAMINOPHEN 7.5-325 MG PO TABS: 1.0000 | ORAL_TABLET | Freq: Four times a day (QID) | ORAL | Status: DC | PRN

## 2013-09-07 NOTE — Telephone Encounter (Signed)
Pt informed Rx is ready 

## 2013-10-05 ENCOUNTER — Other Ambulatory Visit: Payer: Self-pay | Admitting: *Deleted

## 2013-10-05 DIAGNOSIS — M1990 Unspecified osteoarthritis, unspecified site: Secondary | ICD-10-CM

## 2013-10-05 DIAGNOSIS — M25569 Pain in unspecified knee: Secondary | ICD-10-CM

## 2013-10-06 MED ORDER — HYDROCODONE-ACETAMINOPHEN 7.5-325 MG PO TABS: 1.0000 | ORAL_TABLET | Freq: Four times a day (QID) | ORAL | Status: DC | PRN

## 2013-10-06 NOTE — Telephone Encounter (Signed)
Rx ready - talked to pt's daughter.

## 2013-11-08 ENCOUNTER — Other Ambulatory Visit: Payer: Self-pay | Admitting: *Deleted

## 2013-11-08 DIAGNOSIS — M1990 Unspecified osteoarthritis, unspecified site: Secondary | ICD-10-CM

## 2013-11-08 MED ORDER — HYDROCODONE-ACETAMINOPHEN 7.5-325 MG PO TABS: 1.0000 | ORAL_TABLET | Freq: Four times a day (QID) | ORAL | Status: DC | PRN

## 2013-11-08 NOTE — Telephone Encounter (Signed)
Pls sch appt with PCP next 60 days.

## 2013-11-08 NOTE — Telephone Encounter (Signed)
Call [phone] when rx is ready.

## 2013-11-08 NOTE — Telephone Encounter (Signed)
Rxs ready - pt called, talked to her daughter, also informed need to schedule an appt. Message sent to front desk to schedule an appt.

## 2013-11-16 ENCOUNTER — Other Ambulatory Visit: Payer: Self-pay | Admitting: *Deleted

## 2013-11-16 DIAGNOSIS — E119 Type 2 diabetes mellitus without complications: Secondary | ICD-10-CM

## 2013-11-16 DIAGNOSIS — I10 Essential (primary) hypertension: Secondary | ICD-10-CM

## 2013-11-16 MED ORDER — AMLODIPINE BESYLATE 10 MG PO TABS: 10.0000 mg | ORAL_TABLET | Freq: Every day | ORAL | Status: DC

## 2013-11-16 NOTE — Telephone Encounter (Signed)
Needs an appointment with new PCP in next 3 months.

## 2013-12-27 ENCOUNTER — Encounter: Payer: PRIVATE HEALTH INSURANCE | Admitting: Internal Medicine

## 2014-01-07 ENCOUNTER — Other Ambulatory Visit: Payer: Self-pay | Admitting: *Deleted

## 2014-01-07 DIAGNOSIS — E1140 Type 2 diabetes mellitus with diabetic neuropathy, unspecified: Secondary | ICD-10-CM

## 2014-01-07 NOTE — Telephone Encounter (Signed)
Patient needs to get labs done - Repeat BMP before ordering metformin.

## 2014-01-10 NOTE — Telephone Encounter (Signed)
Scheduled appointment 9/18

## 2014-01-14 ENCOUNTER — Ambulatory Visit (INDEPENDENT_AMBULATORY_CARE_PROVIDER_SITE_OTHER): Payer: PRIVATE HEALTH INSURANCE | Admitting: Internal Medicine

## 2014-01-14 ENCOUNTER — Encounter: Payer: Self-pay | Admitting: Internal Medicine

## 2014-01-14 VITALS — BP 139/69 | HR 67 | Temp 99.3°F | Ht 62.0 in | Wt 187.0 lb

## 2014-01-14 DIAGNOSIS — Z23 Encounter for immunization: Secondary | ICD-10-CM

## 2014-01-14 DIAGNOSIS — R0600 Dyspnea, unspecified: Secondary | ICD-10-CM

## 2014-01-14 DIAGNOSIS — D649 Anemia, unspecified: Secondary | ICD-10-CM

## 2014-01-14 DIAGNOSIS — N182 Chronic kidney disease, stage 2 (mild): Secondary | ICD-10-CM

## 2014-01-14 DIAGNOSIS — E1040 Type 1 diabetes mellitus with diabetic neuropathy, unspecified: Secondary | ICD-10-CM

## 2014-01-14 DIAGNOSIS — E1049 Type 1 diabetes mellitus with other diabetic neurological complication: Secondary | ICD-10-CM

## 2014-01-14 DIAGNOSIS — I10 Essential (primary) hypertension: Secondary | ICD-10-CM

## 2014-01-14 DIAGNOSIS — R0989 Other specified symptoms and signs involving the circulatory and respiratory systems: Secondary | ICD-10-CM

## 2014-01-14 DIAGNOSIS — E1142 Type 2 diabetes mellitus with diabetic polyneuropathy: Secondary | ICD-10-CM

## 2014-01-14 DIAGNOSIS — M1990 Unspecified osteoarthritis, unspecified site: Secondary | ICD-10-CM

## 2014-01-14 DIAGNOSIS — R0609 Other forms of dyspnea: Secondary | ICD-10-CM

## 2014-01-14 DIAGNOSIS — Z0000 Encounter for general adult medical examination without abnormal findings: Secondary | ICD-10-CM

## 2014-01-14 DIAGNOSIS — E785 Hyperlipidemia, unspecified: Secondary | ICD-10-CM

## 2014-01-14 LAB — GLUCOSE, CAPILLARY: Glucose-Capillary: 126 mg/dL — ABNORMAL HIGH (ref 70–99)

## 2014-01-14 LAB — POCT GLYCOSYLATED HEMOGLOBIN (HGB A1C): HEMOGLOBIN A1C: 6

## 2014-01-14 MED ORDER — HYDROCODONE-ACETAMINOPHEN 7.5-325 MG PO TABS: 1.0000 | ORAL_TABLET | Freq: Four times a day (QID) | ORAL | Status: DC | PRN

## 2014-01-14 MED ORDER — HYDROCHLOROTHIAZIDE 12.5 MG PO CAPS: 12.5000 mg | ORAL_CAPSULE | Freq: Every day | ORAL | Status: DC

## 2014-01-14 NOTE — Progress Notes (Signed)
INTERNAL MEDICINE TEACHING ATTENDING ADDENDUM - Earl Lagos, MD: I personally saw and evaluated Ms. Finerty in this clinic visit in conjunction with the resident, Dr. Loma Newton. I have discussed patient's plan of care with medical resident during this visit. I have confirmed the physical exam findings and have read and agree with the clinic note including the plan with the following addition: - Pt with chronic LE edema. Uncertain etiology with no prior cardiac w/u. Possibly secondary to norvasc - Will d/c norvasc and lasix and start HCTZ for BP control - Will check 2 D ECHO to evaluate EF - Will consider adding verapamil or diltiazem on next visit for reno protection given microalbuminuria/DM and angioedema with ACE-I

## 2014-01-14 NOTE — Assessment & Plan Note (Addendum)
Lab Results  Component Value Date   HGBA1C 6.0 01/14/2014   HGBA1C 6.0 07/26/2013   HGBA1C 6.1 03/15/2013     Assessment: Diabetes control: good control (HgbA1C at goal) Progress toward A1C goal:    Comments: Patient is currently on metformin 500 mg twice a day  Plan: Medications:  continue current medications Home glucose monitoring: Frequency:   Timing:   Instruction/counseling given: discussed foot care Educational resources provided:   Self management tools provided:

## 2014-01-14 NOTE — Progress Notes (Signed)
   Subjective:    Patient ID: Wanda Mclaughlin, female    DOB: 03-06-1929, 80 y.o.   MRN: [id number]  Diabetes Pertinent negatives for hypoglycemia include no dizziness. Pertinent negatives for diabetes include no chest pain.      Review of Systems  Constitutional: Negative for fever and chills.  HENT: Negative for rhinorrhea.   Respiratory: Positive for shortness of breath. Negative for cough. Stridor:  with exertion.   Cardiovascular: Negative for chest pain.  Gastrointestinal: Positive for constipation. Negative for nausea, vomiting, abdominal pain and diarrhea.  Endocrine: Positive for cold intolerance.  Genitourinary: Negative for dysuria and hematuria.  Skin: Negative for rash.  Neurological: Negative for dizziness and syncope.       Objective:   Physical Exam        Assessment & Plan:

## 2014-01-14 NOTE — Patient Instructions (Signed)
Please stop your lasix, amlodipine, and potassium. We have started a new medication called hydrochlorothiazide for you to take instead. Please return to clinic in 1-2 weeks so we can check to see how your are doing on the new regimen. We will do some blood tests and urine tests to check how your kidneys are doing. We also scheduled a echocardiogram to take a look at your heart.  General Instructions:   Thank you for bringing your medicines today. This helps [place] keep you safe from mistakes.   Progress Toward Treatment Goals:  Treatment Goal 07/26/2013  Hemoglobin A1C at goal  Blood pressure at goal    Self Care Goals & Plans:  Self Care Goal 07/26/2013  Manage my medications take my medicines as prescribed; bring my medications to every visit; refill my medications on time; follow the sick day instructions if I am sick  Monitor my health keep track of my blood glucose; bring my glucose meter and log to each visit; keep track of my blood pressure; keep track of my weight; check my feet daily  Eat healthy foods eat more vegetables; eat fruit for snacks and desserts; eat baked foods instead of fried foods; drink diet soda or water instead of juice or soda  Be physically active find an activity I enjoy    Home Blood Glucose Monitoring 07/26/2013  Check my blood sugar 3 times a day  When to check my blood sugar before meals     Care Management & Community Referrals:  No flowsheet data found.

## 2014-01-14 NOTE — Assessment & Plan Note (Signed)
Flu vaccine given today. 

## 2014-01-14 NOTE — Assessment & Plan Note (Addendum)
BP Readings from Last 3 Encounters:  01/14/14 139/69  07/26/13 155/77  03/15/13 140/77    Lab Results  Component Value Date   NA 139 03/15/2013   K 4.5 03/15/2013   CREATININE 1.27* 03/15/2013    Assessment: Blood pressure control: controlled Progress toward BP goal:  at goal Comments: Patient currently on a regimen of amlodipine, Lasix, and atenolol. There is concern that the amlodipine could be causing her lateral lower extremity edema. Patient's blood pressure is well controlled today. Patient has had complaints of bilateral lower extremity edema for a long time. She's also been on amlodipine for blood pressure and Lasix for what seems to be her bilateral lower extremity edema.   Plan: Medications:  We will trial discontinuing Lasix and amlodipine in starting the patient on hydrochlorothiazide. We'll followup with patient in one to 2 weeks in clinic to assess whether her lower extremity edema has gotten better and that her blood pressure is still under control.  Educational resources provided:   Self management tools provided:   Other plans: Ordered a BMET and microalbumin to creatinine urine study. Also order an echo since there is not on file and she has dyspnea with exertion.

## 2014-01-14 NOTE — Assessment & Plan Note (Signed)
Patient continues to have bilateral knee pain. She states that her pain is well-controlled with her Norco. Patient has a pain contract with no violations.  - Refilled her Norco 7.5-325 q6h #120 with one refill

## 2014-01-14 NOTE — Assessment & Plan Note (Addendum)
Patient at goal on Lipitor 10 mg. Lipid panel due in about 3 months.  Lipid Panel     Component Value Date/Time   CHOL 154 03/15/2013 1524   TRIG 115 03/15/2013 1524   HDL 52 03/15/2013 1524   CHOLHDL 3.0 03/15/2013 1524   VLDL 23 03/15/2013 1524   LDLCALC 79 03/15/2013 1524

## 2014-01-14 NOTE — Progress Notes (Signed)
   Subjective:    Patient ID: Wanda Mclaughlin, female    DOB: April 26, 1929, 80 y.o.   MRN: [id number]  Diabetes Pertinent negatives for diabetes include no chest pain.    Wanda Mclaughlin is an 80 year old woman with a history of hypertension, type 2 diabetes, chronic kidney disease, hyperlipidemia, osteoporosis, degenerative joint disease, and anemia who presents to clinic for routine checkup.   Please refer to separate problem-list charting for more details.   Review of Systems  Constitutional: Negative for fever and chills.  Respiratory: Positive for shortness of breath ( with exertion). Negative for cough.   Cardiovascular: Negative for chest pain.  Gastrointestinal: Negative for nausea, vomiting, abdominal pain and diarrhea.  Genitourinary: Negative for dysuria and hematuria.  Skin: Negative for rash.  Neurological: Negative for syncope.       Objective:   Physical Exam  Constitutional: She is oriented to person, place, and time. She appears well-developed and well-nourished. No distress.  HENT:  Head: Normocephalic and atraumatic.  Eyes: EOM are normal. Pupils are equal, round, and reactive to light.  Neck: Normal range of motion. Neck supple. No thyromegaly present.  Cardiovascular: Normal rate and regular rhythm.  Exam reveals no gallop and no friction rub.   No murmur heard. Pulmonary/Chest: Effort normal and breath sounds normal. No respiratory distress. She has no wheezes.  Abdominal: Soft. Bowel sounds are normal. She exhibits no distension. There is no tenderness. There is no rebound.  Musculoskeletal: Normal range of motion. She exhibits no edema.  Neurological: She is alert and oriented to person, place, and time.  Skin: No rash noted.       Assessment & Plan:  Please refer to separate problem-list charting for more details.

## 2014-01-14 NOTE — Assessment & Plan Note (Addendum)
Patient is not on an ACE inhibitor secondary to angioedema. Last urine microalbumin to creatinine ratio was within normal limits.  - Repeat urine microalbumin to creatinine ratio  - Repeat BMP today  - Consider change to verapamil in the future for further management of blood pressure, we'll hold off for now given other changes in regimens.

## 2014-01-14 NOTE — Assessment & Plan Note (Signed)
No complaints of fatigue today. Last hemoglobin at 11.5 in March 2015.   - Repeat CBC today.  CBC    Component Value Date/Time   WBC 5.5 07/26/2013 1521   RBC 3.75* 07/26/2013 1521   RBC 4.04 06/28/2011 1458   HGB 11.5* 07/26/2013 1521   HCT 34.5* 07/26/2013 1521   PLT 236 07/26/2013 1521   MCV 92.0 07/26/2013 1521   MCH 30.7 07/26/2013 1521   MCHC 33.3 07/26/2013 1521   RDW 14.5 07/26/2013 1521   LYMPHSABS 1.2 07/01/2011 1018   MONOABS 0.6 07/01/2011 1018   EOSABS 0.0 07/01/2011 1018   BASOSABS 0.0 07/01/2011 1018

## 2014-01-15 LAB — CBC
HEMATOCRIT: 34.3 % — AB (ref 36.0–46.0)
HEMOGLOBIN: 11.6 g/dL — AB (ref 12.0–15.0)
MCH: 31.2 pg (ref 26.0–34.0)
MCHC: 33.8 g/dL (ref 30.0–36.0)
MCV: 92.2 fL (ref 78.0–100.0)
Platelets: 220 10*3/uL (ref 150–400)
RBC: 3.72 MIL/uL — ABNORMAL LOW (ref 3.87–5.11)
RDW: 14.9 % (ref 11.5–15.5)
WBC: 4.4 10*3/uL (ref 4.0–10.5)

## 2014-01-15 LAB — MICROALBUMIN / CREATININE URINE RATIO
CREATININE, URINE: 157.5 mg/dL
Microalb Creat Ratio: 49.1 mg/g — ABNORMAL HIGH (ref 0.0–30.0)
Microalb, Ur: 7.74 mg/dL — ABNORMAL HIGH (ref 0.00–1.89)

## 2014-01-15 LAB — BASIC METABOLIC PANEL
BUN: 18 mg/dL (ref 6–23)
CALCIUM: 9.4 mg/dL (ref 8.4–10.5)
CO2: 23 mEq/L (ref 19–32)
CREATININE: 1.21 mg/dL — AB (ref 0.50–1.10)
Chloride: 106 mEq/L (ref 96–112)
GLUCOSE: 97 mg/dL (ref 70–99)
Potassium: 4.4 mEq/L (ref 3.5–5.3)
Sodium: 139 mEq/L (ref 135–145)

## 2014-01-19 ENCOUNTER — Other Ambulatory Visit: Payer: Self-pay | Admitting: *Deleted

## 2014-01-19 DIAGNOSIS — E1149 Type 2 diabetes mellitus with other diabetic neurological complication: Secondary | ICD-10-CM

## 2014-01-19 DIAGNOSIS — E119 Type 2 diabetes mellitus without complications: Secondary | ICD-10-CM

## 2014-01-19 MED ORDER — GABAPENTIN 600 MG PO TABS: ORAL_TABLET | ORAL | Status: DC

## 2014-01-25 ENCOUNTER — Ambulatory Visit (INDEPENDENT_AMBULATORY_CARE_PROVIDER_SITE_OTHER): Payer: PRIVATE HEALTH INSURANCE | Admitting: Internal Medicine

## 2014-01-25 ENCOUNTER — Encounter: Payer: Self-pay | Admitting: Internal Medicine

## 2014-01-25 VITALS — BP 182/69 | HR 57 | Temp 98.7°F | Ht 62.0 in | Wt 184.0 lb

## 2014-01-25 DIAGNOSIS — R609 Edema, unspecified: Secondary | ICD-10-CM

## 2014-01-25 DIAGNOSIS — E1140 Type 2 diabetes mellitus with diabetic neuropathy, unspecified: Secondary | ICD-10-CM

## 2014-01-25 DIAGNOSIS — E1149 Type 2 diabetes mellitus with other diabetic neurological complication: Secondary | ICD-10-CM

## 2014-01-25 DIAGNOSIS — I10 Essential (primary) hypertension: Secondary | ICD-10-CM

## 2014-01-25 DIAGNOSIS — E1142 Type 2 diabetes mellitus with diabetic polyneuropathy: Secondary | ICD-10-CM

## 2014-01-25 DIAGNOSIS — R600 Localized edema: Secondary | ICD-10-CM

## 2014-01-25 MED ORDER — HYDRALAZINE HCL 10 MG PO TABS: 10.0000 mg | ORAL_TABLET | Freq: Three times a day (TID) | ORAL | Status: DC

## 2014-01-25 NOTE — Patient Instructions (Addendum)
We have added hydralazine 10 mg, which you should take three times a day. Please follow up with us in one month to track your blood pressures.  General Instructions:   Thank you for bringing your medicines today. This helps us keep you safe from mistakes.   Progress Toward Treatment Goals:  Treatment Goal 01/25/2014  Hemoglobin A1C -  Blood pressure deteriorated    Self Care Goals & Plans:  Self Care Goal 07/26/2013  Manage my medications take my medicines as prescribed; bring my medications to every visit; refill my medications on time; follow the sick day instructions if I am sick  Monitor my health keep track of my blood glucose; bring my glucose meter and log to each visit; keep track of my blood pressure; keep track of my weight; check my feet daily  Eat healthy foods eat more vegetables; eat fruit for snacks and desserts; eat baked foods instead of fried foods; drink diet soda or water instead of juice or soda  Be physically active find an activity I enjoy    Home Blood Glucose Monitoring 07/26/2013  Check my blood sugar 3 times a day  When to check my blood sugar before meals     Care Management & Community Referrals:  No flowsheet data found.

## 2014-01-25 NOTE — Assessment & Plan Note (Signed)
Patient has much improved bilateral edema, now only trace, after discontinuation of amlodipine.  - We will continue to try to manage her blood pressure without the use of amlodipine moving forward.

## 2014-01-25 NOTE — Assessment & Plan Note (Addendum)
BP Readings from Last 3 Encounters:  01/25/14 182/69  01/14/14 139/69  07/26/13 155/77    Lab Results  Component Value Date   NA 139 01/14/2014   K 4.4 01/14/2014   CREATININE 1.21* 01/14/2014    Assessment: Blood pressure control: moderately elevated Progress toward BP goal:  deteriorated Comments: Patient is currently on hydrochlorothiazide 50 milligrams daily, atenolol 50 mg daily. Recently discontinued amlodipine and Lasix secondary to lower extremity edema. Patient also has a history of angioedema to ACE inhibitors.  Plan: Medications:  We will continue the hydrochlorothiazide and atenolol but add on hydralazine 10 mg 3 times a day. Patient to followup in clinic in one month.

## 2014-01-25 NOTE — Assessment & Plan Note (Signed)
Lab Results  Component Value Date   HGBA1C 6.0 01/14/2014   HGBA1C 6.0 07/26/2013   HGBA1C 6.1 03/15/2013     Assessment: Diabetes control: good control (HgbA1C at goal) Progress toward A1C goal:   stable Comments: Patient currently on metformin 500 mg twice a day  Plan: Medications:  continue current medications

## 2014-01-25 NOTE — Progress Notes (Signed)
   Subjective:    Patient ID: Wanda HumphreysHazel L Mclaughlin, female    DOB: 1928-10-15, 80 y.o.   MRN: [id number]  [name] is an 80 year old woman with a history of hypertension, type 2 diabetes, chronic kidney disease, hyperlipidemia, osteoporosis, degenerative joint disease, and anemia since a clinic for followup for her blood pressure medication management.  Please refer to separate problem-list charting for more details.   Hypertension Associated symptoms include shortness of breath ( with exertion). Pertinent negatives include no chest pain or palpitations.    Review of Systems  Constitutional: Negative for fever and chills.  HENT: Positive for rhinorrhea. Negative for sore throat.   Respiratory: Positive for shortness of breath ( with exertion). Negative for cough.   Cardiovascular: Negative for chest pain and palpitations.  Gastrointestinal: Negative for nausea, vomiting, abdominal pain, diarrhea, constipation and blood in stool.  Genitourinary: Negative for dysuria and hematuria.  Neurological: Negative for syncope.       Objective:   Physical Exam  Constitutional: She is oriented to person, place, and time. She appears well-developed and well-nourished. No distress.  HENT:  Head: Normocephalic and atraumatic.  Eyes: EOM are normal. Pupils are equal, round, and reactive to light. Left eye exhibits no discharge.  Neck: Normal range of motion. Neck supple. No thyromegaly present.  Cardiovascular: Normal rate and regular rhythm.  Exam reveals no gallop and no friction rub.   No murmur heard. Pulmonary/Chest: Effort normal and breath sounds normal. No respiratory distress. She has no wheezes. She has no rales.  Abdominal: Soft. Bowel sounds are normal. She exhibits no distension. There is no tenderness. There is no rebound.  Musculoskeletal: She exhibits edema ( Trace edema bilateral lower extremities).  Neurological: She is alert and oriented to person, place, and time. No cranial nerve  deficit.  Skin: Skin is warm and dry. No rash noted.  Psychiatric: She has a normal mood and affect. Thought content normal.   Filed Vitals:   01/25/14 1517  BP: 182/69  Pulse: 57  Temp: 98.7 F (37.1 C)       Assessment & Plan:  Please refer to separate problem-list charting for more details.

## 2014-01-25 NOTE — Progress Notes (Signed)
INTERNAL MEDICINE TEACHING ATTENDING ADDENDUM - Wanda LagosNischal Allexa Acoff, MD: I personally saw and evaluated Wanda Mclaughlin in this clinic visit in conjunction with the resident, Dr. Loma NewtonNgo. I have discussed patient's plan of care with medical resident during this visit. I have confirmed the physical exam findings and have read and agree with the clinic note including the plan with the following addition: - LE edema improved after discontinuation of amlodipine - BP is elevated. Will add hydralazine for BP control and monitor

## 2014-02-10 ENCOUNTER — Other Ambulatory Visit: Payer: Self-pay | Admitting: *Deleted

## 2014-02-10 DIAGNOSIS — I10 Essential (primary) hypertension: Secondary | ICD-10-CM

## 2014-02-10 DIAGNOSIS — K2970 Gastritis, unspecified, without bleeding: Secondary | ICD-10-CM

## 2014-02-10 MED ORDER — ATORVASTATIN CALCIUM 10 MG PO TABS: 10.0000 mg | ORAL_TABLET | Freq: Every day | ORAL | Status: DC

## 2014-02-10 MED ORDER — ATENOLOL 50 MG PO TABS: 50.0000 mg | ORAL_TABLET | Freq: Every day | ORAL | Status: DC

## 2014-02-10 MED ORDER — OMEPRAZOLE 20 MG PO CPDR: 20.0000 mg | DELAYED_RELEASE_CAPSULE | Freq: Every day | ORAL | Status: DC

## 2014-02-15 ENCOUNTER — Other Ambulatory Visit: Payer: Self-pay | Admitting: *Deleted

## 2014-02-15 MED ORDER — METFORMIN HCL 500 MG PO TABS: ORAL_TABLET | ORAL | Status: DC

## 2014-02-17 ENCOUNTER — Ambulatory Visit (HOSPITAL_COMMUNITY)
Admission: RE | Admit: 2014-02-17 | Discharge: 2014-02-17 | Disposition: A | Discharge status: Home / Self Care | Payer: PRIVATE HEALTH INSURANCE | Source: Ambulatory Visit | Attending: Internal Medicine | Admitting: Internal Medicine

## 2014-02-17 DIAGNOSIS — I359 Nonrheumatic aortic valve disorder, unspecified: Secondary | ICD-10-CM

## 2014-02-17 DIAGNOSIS — R0609 Other forms of dyspnea: Secondary | ICD-10-CM | POA: Insufficient documentation

## 2014-02-17 DIAGNOSIS — E119 Type 2 diabetes mellitus without complications: Secondary | ICD-10-CM | POA: Diagnosis not present

## 2014-02-17 DIAGNOSIS — R0600 Dyspnea, unspecified: Secondary | ICD-10-CM

## 2014-02-17 NOTE — Progress Notes (Signed)
*  PRELIMINARY RESULTS* Echocardiogram 2D Echocardiogram has been performed.  Purva Vessell 02/17/2014, 11:50 AM 

## 2014-02-22 ENCOUNTER — Ambulatory Visit (INDEPENDENT_AMBULATORY_CARE_PROVIDER_SITE_OTHER): Payer: PRIVATE HEALTH INSURANCE | Admitting: Internal Medicine

## 2014-02-22 ENCOUNTER — Encounter: Payer: Self-pay | Admitting: Internal Medicine

## 2014-02-22 VITALS — BP 155/67 | HR 65 | Temp 98.7°F | Ht 63.0 in | Wt 183.8 lb

## 2014-02-22 DIAGNOSIS — I10 Essential (primary) hypertension: Secondary | ICD-10-CM

## 2014-02-22 DIAGNOSIS — M170 Bilateral primary osteoarthritis of knee: Secondary | ICD-10-CM

## 2014-02-22 LAB — GLUCOSE, CAPILLARY: Glucose-Capillary: 162 mg/dL — ABNORMAL HIGH (ref 70–99)

## 2014-02-22 MED ORDER — HYDROCODONE-ACETAMINOPHEN 7.5-325 MG PO TABS: 1.0000 | ORAL_TABLET | Freq: Four times a day (QID) | ORAL | Status: DC | PRN

## 2014-02-22 MED ORDER — HYDRALAZINE HCL 25 MG PO TABS: 25.0000 mg | ORAL_TABLET | Freq: Three times a day (TID) | ORAL | Status: DC

## 2014-02-22 NOTE — Assessment & Plan Note (Signed)
BP Readings from Last 3 Encounters:  02/22/14 155/67  01/25/14 182/69  01/14/14 139/69    Lab Results  Component Value Date   NA 139 01/14/2014   K 4.4 01/14/2014   CREATININE 1.21* 01/14/2014    Assessment: Blood pressure control: mildly elevated Progress toward BP goal:  improved Comments: Atenolol 50 mg daily, hydrochlorothiazide 12.5 mg daily, and started on hydralazine 10 mg 3 times daily on 01/16/1914. She is compliant and brings in her pill bottles.  Plan: Medications:  I will increase hydralazine to 25 mg 3 times a day. Continue with the rest of her medications as before. Educational resources provided: brochure Self management tools provided: home blood pressure logbook Other plans: Hydrochlorothiazide dose will need to be clarified on her next visit. Her previous note indicated a dose of 50 mg daily. Follow-up in 2 weeks.

## 2014-02-22 NOTE — Progress Notes (Signed)
Patient ID: Wanda HumphreysHazel L Servellon, female   DOB: Oct 23, 1928, 80 y.o.   MRN: [id number]   Subjective:   HPI: Wanda Mclaughlin is a 80 y.o. with past medical history of hypertension, diabetes, bilateral knee osteoarthritis, among other problems, presents for follow-up for her hypertension.  Reason(s) for this visit: 1. Medication management for Hypertension: During the patient's last clinic visit on 01/25/2014, amlodipine was discontinued due to shortness of breath and increased bilateral lower extremity edema. An echocardiogram was ordered. This has come back normal, with good ejection fraction and without valvular abnormalities. Patient was initiated on hydralazine 10 mg 3 times a day. She has been compliant. She also takes hydrochlorothiazide ( unclear about dosing, 12.5 mg versus 25 mg versus 50 mg daily), and Atenolol 50 mg daily. She is accompanied by her daughter. They deny any symptoms. Discussed the results of echocardiogram. Blood pressure 155/84. I will increase hydralazine to 25 mg 3 times a day. I will have the patient come back in 2 weeks for recheck blood pressure.   Patient requests for refills on her pain medications.   ROS: Constitutional: Denies fever, chills, diaphoresis, appetite change and fatigue.  Respiratory: Denies SOB, DOE, cough, chest tightness, and wheezing. Denies chest pain. CVS: No chest pain, palpitations and leg swelling.  GI: No abdominal pain, nausea, vomiting, bloody stools GU: No dysuria, frequency, hematuria, or flank pain.  MSK: No myalgias, back pain, joint swelling, arthralgias  Psych: No depression symptoms. No SI or SA.    Objective:  Physical Exam: Filed Vitals:   02/22/14 1001 02/22/14 1035  BP: 152/60 155/67  Pulse: 71 65  Temp: 98.7 F (37.1 C)   TempSrc: Oral   Height: 5\' 3"  (1.6 m)   Weight: 183 lb 12.8 oz (83.371 kg)   SpO2: 96%    General: Well nourished. No acute distress.  HEENT: Normal oral mucosa. MMM.  Lungs: CTA  bilaterally. Heart: RRR; no extra sounds or murmurs  Abdomen: Non-distended, normal bowel sounds, soft, nontender; no hepatosplenomegaly  Extremities: No pedal edema. No joint swelling or tenderness. Neurologic: Normal EOM,  Alert and oriented x3. No obvious neurologic/cranial nerve deficits.  Assessment & Plan:  Discussed case with my attending in the clinic, Dr. Rogelia BogaButcher. See problem based charting.

## 2014-02-22 NOTE — Patient Instructions (Signed)
General Instructions: I have increased one of your medications for blood pressure. Please take hydralazine 25 mg 3 times a day. With your current bottle, you may take 20 mg 3 times a day-that is 2 pills 3 times a day. Please come back in 2 weeks to recheck your blood pressure.  Please bring your medicines with you each time you come to clinic.  Medicines may include prescription medications, over-the-counter medications, herbal remedies, eye drops, vitamins, or other pills.   Progress Toward Treatment Goals:  Treatment Goal 02/22/2014  Hemoglobin A1C -  Blood pressure improved    Self Care Goals & Plans:  Self Care Goal 02/22/2014  Manage my medications take my medicines as prescribed; bring my medications to every visit; refill my medications on time  Monitor my health keep track of my blood glucose  Eat healthy foods drink diet soda or water instead of juice or soda; eat more vegetables; eat foods that are low in salt; eat baked foods instead of fried foods; eat fruit for snacks and desserts  Be physically active -    Home Blood Glucose Monitoring 02/22/2014  Check my blood sugar once a day  When to check my blood sugar before breakfast     Care Management & Community Referrals:  Referral 02/22/2014  Referrals made for care management support none needed

## 2014-02-22 NOTE — Assessment & Plan Note (Signed)
Refilled Vicodin (handed out 3 scripts). Her next refill should be 05/26/2014.

## 2014-02-23 NOTE — Addendum Note (Signed)
Addended by: Remus BlakeBARROW, Clela Hagadorn K on: 02/23/2014 02:53 PM   Modules accepted: Orders

## 2014-02-25 NOTE — Progress Notes (Signed)
Internal Medicine Clinic Attending  Case discussed with Dr. Kazibwe soon after the resident saw the patient.  We reviewed the resident's history and exam and pertinent patient test results.  I agree with the assessment, diagnosis, and plan of care documented in the resident's note. 

## 2014-03-16 ENCOUNTER — Ambulatory Visit (INDEPENDENT_AMBULATORY_CARE_PROVIDER_SITE_OTHER): Payer: PRIVATE HEALTH INSURANCE | Admitting: Internal Medicine

## 2014-03-16 VITALS — BP 171/72 | HR 64 | Temp 99.0°F | Wt 183.6 lb

## 2014-03-16 DIAGNOSIS — R600 Localized edema: Secondary | ICD-10-CM

## 2014-03-16 DIAGNOSIS — Z0000 Encounter for general adult medical examination without abnormal findings: Secondary | ICD-10-CM

## 2014-03-16 DIAGNOSIS — E785 Hyperlipidemia, unspecified: Secondary | ICD-10-CM

## 2014-03-16 DIAGNOSIS — I10 Essential (primary) hypertension: Secondary | ICD-10-CM

## 2014-03-16 LAB — LIPID PANEL
CHOLESTEROL: 137 mg/dL (ref 0–200)
HDL: 50 mg/dL (ref >39)
LDL Cholesterol: 68 mg/dL (ref 0–99)
Total CHOL/HDL Ratio: 2.7 Ratio
Triglycerides: 94 mg/dL (ref <150)
VLDL: 19 mg/dL (ref 0–40)

## 2014-03-16 NOTE — Assessment & Plan Note (Signed)
Patient is on atorvastatin 10 mg daily. Patient is due for a lipid panel today.

## 2014-03-16 NOTE — Assessment & Plan Note (Addendum)
BP Readings from Last 3 Encounters:  03/16/14 171/72  02/22/14 155/67  01/25/14 182/69    Lab Results  Component Value Date   NA 139 01/14/2014   K 4.4 01/14/2014   CREATININE 1.21* 01/14/2014    Assessment: Blood pressure control: moderately elevated Progress toward BP goal:  deteriorated Comments: during the patient's last visit 2 weeks ago with Dr. Zada GirtKazibwe, patient was found to be hypertensive and she was titrated up on her hydralazine from 10 mg 3 times a day to 25 mg 3 times a day. She was continued on atenolol 50 mg daily and hydrochlorothiazide 12.5 mg daily. Patient reports that she has been taking her atenolol and hydrochlorothiazide at home, but she has not titrated up her hydralazine 10 mg 3 times a day until his morning before her clinic visit. She stated that she finished her 10 mg pills and didn't finish that until yesterday evening. Patient does not have any blood pressure cuffs at home to measure blood pressure.  Plan: Medications:  continue current medications. Unfortunately, patient has not titrated up her hydralazine as prescribed during her last visit. Patient was counseled and educated on importance of increasing the dosage of her blood pressure medications. We will check her blood pressure again in 2 weeks. We have also consulted social work to have her set up with home blood pressure monitoring.

## 2014-03-16 NOTE — Progress Notes (Signed)
   Subjective:    Patient ID: Wanda Mclaughlin, female    DOB: 1928/11/28, 80 y.o.   MRN: [id number]  HPI  Patient is an 80 year old female with a history of hypertension, diabetes, bilateral knee arterial arthritis who presents for blood pressure check.  Please refer to separate problem-list charting for more details.  Review of Systems  Constitutional: Negative for fever and chills.  HENT: Negative for rhinorrhea and sore throat.   Eyes: Negative for visual disturbance.  Respiratory: Negative for cough and shortness of breath.   Cardiovascular: Negative for chest pain and palpitations.  Gastrointestinal: Negative for nausea, vomiting, abdominal pain, diarrhea, constipation and blood in stool.  Genitourinary: Negative for dysuria and hematuria.  Neurological: Negative for syncope.      Objective:   Physical Exam  Constitutional: She is oriented to person, place, and time. She appears well-developed and well-nourished. No distress.  HENT:  Head: Normocephalic and atraumatic.  Eyes: EOM are normal. Pupils are equal, round, and reactive to light. Left eye exhibits no discharge.  Neck: Normal range of motion. Neck supple. No thyromegaly present.  Cardiovascular: Normal rate and regular rhythm.  Exam reveals no gallop and no friction rub.   No murmur heard. Pulmonary/Chest: Effort normal and breath sounds normal. No respiratory distress. She has no wheezes. She has no rales.  Abdominal: Soft. Bowel sounds are normal. She exhibits no distension. There is no tenderness. There is no rebound.  Musculoskeletal: She exhibits edema ( trace edema bilateral lower extremities, much improved).  Neurological: She is alert and oriented to person, place, and time. No cranial nerve deficit.  Skin: Skin is warm and dry. No rash noted.  Psychiatric: She has a normal mood and affect. Thought content normal.      Assessment & Plan:  Please refer to separate problem-list charting for more details.

## 2014-03-16 NOTE — Assessment & Plan Note (Signed)
Patient reports that her bilateral lower extremity edema has continued to be improved now that she is off amlodipine.

## 2014-03-16 NOTE — Patient Instructions (Signed)
Please continue taking your blood pressure medications as we have prescribed. Atenolol 50 mg daily, hydralazine 25 mg three times a day and hydrochlorothiazide at 12.5 mg daily. Please come back in 2 weeks for a blood pressure check.  General Instructions:   Thank you for bringing your medicines today. This helps us keep you safe from mistakes.   Progress Toward Treatment Goals:  Treatment Goal 03/16/2014  Hemoglobin A1C -  Blood pressure deteriorated    Self Care Goals & Plans:  Self Care Goal 02/22/2014  Manage my medications take my medicines as prescribed; bring my medications to every visit; refill my medications on time  Monitor my health keep track of my blood glucose  Eat healthy foods drink diet soda or water instead of juice or soda; eat more vegetables; eat foods that are low in salt; eat baked foods instead of fried foods; eat fruit for snacks and desserts  Be physically active -    Home Blood Glucose Monitoring 02/22/2014  Check my blood sugar once a day  When to check my blood sugar before breakfast     Care Management & Community Referrals:  Referral 02/22/2014  Referrals made for care management support none needed

## 2014-03-16 NOTE — Assessment & Plan Note (Signed)
Patient due for an eye exam today.

## 2014-03-16 NOTE — Progress Notes (Signed)
Internal Medicine Clinic Attending  I saw and evaluated the patient.  I personally confirmed the key portions of the history and exam documented by Dr. Ngo and I reviewed pertinent patient test results.  The assessment, diagnosis, and plan were formulated together and I agree with the documentation in the resident's note. 

## 2014-04-04 ENCOUNTER — Encounter: Payer: Self-pay | Admitting: Internal Medicine

## 2014-04-04 ENCOUNTER — Ambulatory Visit (INDEPENDENT_AMBULATORY_CARE_PROVIDER_SITE_OTHER): Payer: PRIVATE HEALTH INSURANCE | Admitting: Internal Medicine

## 2014-04-04 VITALS — BP 153/76 | HR 62 | Temp 98.7°F | Ht 63.0 in | Wt 184.2 lb

## 2014-04-04 DIAGNOSIS — E1140 Type 2 diabetes mellitus with diabetic neuropathy, unspecified: Secondary | ICD-10-CM

## 2014-04-04 DIAGNOSIS — I10 Essential (primary) hypertension: Secondary | ICD-10-CM

## 2014-04-04 LAB — POCT GLYCOSYLATED HEMOGLOBIN (HGB A1C): Hemoglobin A1C: 5.9

## 2014-04-04 LAB — GLUCOSE, CAPILLARY: Glucose-Capillary: 87 mg/dL (ref 70–99)

## 2014-04-04 NOTE — Progress Notes (Signed)
   Subjective:    Patient ID: Wanda Mclaughlin, female    DOB: April 04, 1929, 80 y.o.   MRN: [id number]  HPI  Patient is an 80 year old female with a history of hypertension, diabetes, bilateral knee arterial arthritis who presents for blood pressure check.  Please refer to separate problem-list charting for more details.  Review of Systems  Constitutional: Negative for fever and chills.  HENT: Negative for rhinorrhea and sore throat.   Eyes: Negative for visual disturbance.  Respiratory: Negative for cough and shortness of breath.   Cardiovascular: Negative for chest pain and palpitations.  Gastrointestinal: Negative for nausea, vomiting, abdominal pain, diarrhea, constipation and blood in stool.  Genitourinary: Negative for dysuria and hematuria.  Neurological: Negative for syncope.      Objective:   Physical Exam  Constitutional: She is oriented to person, place, and time. She appears well-developed and well-nourished. No distress.  HENT:  Head: Normocephalic and atraumatic.  Hard of hearing  Eyes: EOM are normal. Pupils are equal, round, and reactive to light. Left eye exhibits no discharge.  Neck: Normal range of motion. Neck supple. No thyromegaly present.  Cardiovascular: Normal rate and regular rhythm.  Exam reveals no gallop and no friction rub.   No murmur heard. Pulmonary/Chest: Effort normal and breath sounds normal. No respiratory distress. She has no wheezes. She has no rales.  Abdominal: Soft. Bowel sounds are normal. She exhibits no distension. There is no tenderness. There is no rebound.  Musculoskeletal: She exhibits no edema.  Neurological: She is alert and oriented to person, place, and time. No cranial nerve deficit.  Skin: Skin is warm and dry. No rash noted.  Psychiatric: She has a normal mood and affect. Thought content normal.      Assessment & Plan:  Please refer to separate problem-list charting for more details.

## 2014-04-04 NOTE — Assessment & Plan Note (Addendum)
BP Readings from Last 3 Encounters:  04/04/14 159/62  03/16/14 171/72  02/22/14 155/67    Lab Results  Component Value Date   NA 139 01/14/2014   K 4.4 01/14/2014   CREATININE 1.21* 01/14/2014    Assessment: Blood pressure control: mildly elevated Progress toward BP goal:  improved Comments: Patient has been taking medications as prescribed today. Hydralazine 25 mg TID, atenolol 50 mg daily and HCTZ 12.5 mg daily.   Plan: Medications:  continue current medications, Will check again in 3 months. Educational resources provided: brochure Self management tools provided: home blood pressure logbook

## 2014-04-04 NOTE — Patient Instructions (Addendum)
Please continue taking your medications as prescribed. We will check your blood pressure again in 3 months.   General Instructions:   Thank you for bringing your medicines today. This helps us keep you safe from mistakes.   Progress Toward Treatment Goals:  Treatment Goal 04/04/2014  Hemoglobin A1C at goal  Blood pressure improved    Self Care Goals & Plans:  Self Care Goal 04/04/2014  Manage my medications take my medicines as prescribed; bring my medications to every visit; refill my medications on time  Monitor my health keep track of my blood glucose; bring my glucose meter and log to each visit  Eat healthy foods drink diet soda or water instead of juice or soda; eat more vegetables; eat foods that are low in salt; eat baked foods instead of fried foods; eat fruit for snacks and desserts  Be physically active -    Home Blood Glucose Monitoring 02/22/2014  Check my blood sugar once a day  When to check my blood sugar before breakfast     Care Management & Community Referrals:  Referral 02/22/2014  Referrals made for care management support none needed

## 2014-04-04 NOTE — Assessment & Plan Note (Signed)
Lab Results  Component Value Date   HGBA1C 5.9 04/04/2014   HGBA1C 6.0 01/14/2014   HGBA1C 6.0 07/26/2013     Assessment: Diabetes control: good control (HgbA1C at goal) Progress toward A1C goal:  at goal  Plan: Medications:  continue current medications: metformin 500 mg BID Home glucose monitoring: Frequency:   Timing:   Instruction/counseling given: reminded to get eye exam Educational resources provided: brochure Self management tools provided: copy of home glucose meter download

## 2014-04-05 NOTE — Progress Notes (Signed)
INTERNAL MEDICINE TEACHING ATTENDING ADDENDUM - Earl LagosNischal Kamani Lewter, MD: I personally saw and evaluated [name] in this clinic visit in conjunction with the resident, Dr. Loma NewtonNgo. I have discussed patient's plan of care with medical resident during this visit. I have confirmed the physical exam findings and have read and agree with the clinic note including the plan with the following addition: - DM well controlled. Will continue with current meds - BP mildly elevated today. Pt to monitor BP at home and f/u in 3 months - Will continue with current meds

## 2014-05-17 ENCOUNTER — Other Ambulatory Visit: Payer: Self-pay | Admitting: Internal Medicine

## 2014-06-22 ENCOUNTER — Other Ambulatory Visit: Payer: Self-pay | Admitting: Internal Medicine

## 2014-06-30 ENCOUNTER — Telehealth: Payer: Self-pay | Admitting: Internal Medicine

## 2014-06-30 NOTE — Telephone Encounter (Signed)
Call to patient to confirm appointment for 07/04/14 at 1:15 lmtcb

## 2014-07-04 ENCOUNTER — Encounter: Payer: Self-pay | Admitting: Internal Medicine

## 2014-07-04 ENCOUNTER — Ambulatory Visit (INDEPENDENT_AMBULATORY_CARE_PROVIDER_SITE_OTHER): Payer: Medicare Other | Admitting: Internal Medicine

## 2014-07-04 VITALS — BP 166/70 | HR 53 | Temp 98.5°F | Ht 63.0 in | Wt 183.9 lb

## 2014-07-04 DIAGNOSIS — I10 Essential (primary) hypertension: Secondary | ICD-10-CM

## 2014-07-04 DIAGNOSIS — M25562 Pain in left knee: Secondary | ICD-10-CM

## 2014-07-04 DIAGNOSIS — G8929 Other chronic pain: Secondary | ICD-10-CM

## 2014-07-04 DIAGNOSIS — M170 Bilateral primary osteoarthritis of knee: Secondary | ICD-10-CM

## 2014-07-04 DIAGNOSIS — K2990 Gastroduodenitis, unspecified, without bleeding: Secondary | ICD-10-CM | POA: Diagnosis not present

## 2014-07-04 DIAGNOSIS — K2970 Gastritis, unspecified, without bleeding: Secondary | ICD-10-CM | POA: Diagnosis not present

## 2014-07-04 DIAGNOSIS — E1140 Type 2 diabetes mellitus with diabetic neuropathy, unspecified: Secondary | ICD-10-CM | POA: Diagnosis not present

## 2014-07-04 DIAGNOSIS — Z23 Encounter for immunization: Secondary | ICD-10-CM

## 2014-07-04 DIAGNOSIS — Z0000 Encounter for general adult medical examination without abnormal findings: Secondary | ICD-10-CM

## 2014-07-04 LAB — GLUCOSE, CAPILLARY: Glucose-Capillary: 86 mg/dL (ref 70–99)

## 2014-07-04 LAB — POCT GLYCOSYLATED HEMOGLOBIN (HGB A1C): Hemoglobin A1C: 5.9

## 2014-07-04 MED ORDER — LOSARTAN POTASSIUM 25 MG PO TABS: 25.0000 mg | ORAL_TABLET | Freq: Every day | ORAL | Status: DC

## 2014-07-04 MED ORDER — GLUCOSE BLOOD VI STRP: 1.0000 | ORAL_STRIP | Status: DC

## 2014-07-04 MED ORDER — HYDROCODONE-ACETAMINOPHEN 7.5-325 MG PO TABS: 1.0000 | ORAL_TABLET | Freq: Four times a day (QID) | ORAL | Status: DC | PRN

## 2014-07-04 NOTE — Assessment & Plan Note (Addendum)
Lab Results  Component Value Date   HGBA1C 5.9 07/04/2014   HGBA1C 5.9 04/04/2014   HGBA1C 6.0 01/14/2014     Assessment: Diabetes control: good control (HgbA1C at goal) Progress toward A1C goal:  unable to assess Comments: Blood glucose home log showing good glycemic control.  Plan: Medications:  continue current medications Instruction/counseling given: reminded to get eye exam and reminded to bring blood glucose meter & log to each visit Educational resources provided: brochure (denies) Self management tools provided: copy of home glucose meter download Other plans: Test strips reordered.

## 2014-07-04 NOTE — Assessment & Plan Note (Signed)
Patient is on a pain contract for Norco 7.5-325 mg every 6 hours as needed #120 per month. Patient was written for a 3 month supply.

## 2014-07-04 NOTE — Assessment & Plan Note (Addendum)
BP Readings from Last 3 Encounters:  07/04/14 166/70  04/04/14 153/76  03/16/14 171/72    Lab Results  Component Value Date   NA 139 01/14/2014   K 4.4 01/14/2014   CREATININE 1.21* 01/14/2014    Assessment: Blood pressure control: moderately elevated Progress toward BP goal:  deteriorated Comments: Patient's blood pressures have remained elevated during the last several clinic visits. Patient states that she is compliant with her home regimen of hydralazine 25 mg 3 times a day, atenolol 50 mg daily and hydrochlorothiazide 12.5 mg daily.  Plan: Medications:  Add losartan 25 mg daily. Told patient to be aware of risk of angioedema. Patient had questionable reaction to lisinopril in the past. Since patient also has a history of proteinuria the risks outweight the benefits given low rate of angioedema for ARBs. Keep all other medications the same. Educational resources provided: brochure (denies) Self management tools provided:   Other plans: Follow-up blood pressure in one month.

## 2014-07-04 NOTE — Assessment & Plan Note (Signed)
Patient received PCV 13 vaccine today.

## 2014-07-04 NOTE — Patient Instructions (Signed)
Your blood pressure was elevated today in clinic. I have started a new medication called losartan to help with her blood pressure and also to protect her kidneys. Please stop the medication immediately if you notice any swelling in your face, tongue, or throat.   I have sent refills for your glucose test strips as well as written you prescriptions for a 3 month supply of your Percocet.  Please try to get an eye exam at your earliest convenience.  Please stop taking your omeprazole to see if you have any symptoms of burning belly pain or reflux at all. If you do, you can continue taking the medication. If you don't, it may be best to stop the medication altogether.  General Instructions:   Thank you for bringing your medicines today. This helps us keep you safe from mistakes.   Progress Toward Treatment Goals:  Treatment Goal 07/04/2014  Hemoglobin A1C -  Blood pressure deteriorated    Self Care Goals & Plans:  Self Care Goal 07/04/2014  Manage my medications take my medicines as prescribed; bring my medications to every visit; refill my medications on time  Monitor my health keep track of my blood glucose; bring my glucose meter and log to each visit  Eat healthy foods drink diet soda or water instead of juice or soda; eat more vegetables; eat foods that are low in salt; eat baked foods instead of fried foods; eat fruit for snacks and desserts  Be physically active -    Home Blood Glucose Monitoring 02/22/2014  Check my blood sugar once a day  When to check my blood sugar before breakfast     Care Management & Community Referrals:  Referral 02/22/2014  Referrals made for care management support none needed

## 2014-07-04 NOTE — Assessment & Plan Note (Signed)
Patient says that she has not had any symptoms in a long time. -Trial patient off of omeprazole. Stated that patient can re-continue this if she has recurrent symptoms.

## 2014-07-04 NOTE — Addendum Note (Signed)
Addended by: Harold BarbanNGO, Agustina Witzke on: 07/04/2014 05:40 PM   Modules accepted: Kipp BroodSmartSet

## 2014-07-04 NOTE — Progress Notes (Signed)
   Subjective:    Patient ID: Dan HumphreysHazel L Suchan, female    DOB: [birth year], 81 y.o.   MRN: [id number]  HPI  Patient is an 81 year old female with a history of type 2 diabetes, hypertension, osteoarthritis   Please refer to separate problem-list charting for more details.  Review of Systems  Constitutional: Negative for fever and chills.  HENT: Negative for rhinorrhea and sore throat.   Eyes: Negative for visual disturbance.  Respiratory: Negative for cough and shortness of breath.   Cardiovascular: Negative for chest pain and palpitations.  Gastrointestinal: Negative for nausea, vomiting, abdominal pain, diarrhea, constipation and blood in stool.  Genitourinary: Negative for dysuria and hematuria.  Neurological: Negative for syncope.       Objective:   Physical Exam  Constitutional: She is oriented to person, place, and time. She appears well-developed and well-nourished. No distress.  HENT:  Head: Normocephalic and atraumatic.  Eyes: EOM are normal. Pupils are equal, round, and reactive to light. Left eye exhibits no discharge.  Neck: Normal range of motion. Neck supple. No thyromegaly present.  Cardiovascular: Normal rate and regular rhythm.  Exam reveals no gallop and no friction rub.   No murmur heard. Pulmonary/Chest: Effort normal and breath sounds normal. No respiratory distress. She has no wheezes. She has no rales.  Abdominal: Soft. Bowel sounds are normal. She exhibits no distension. There is no tenderness. There is no rebound.  Musculoskeletal: She exhibits no edema.  Neurological: She is alert and oriented to person, place, and time. No cranial nerve deficit.  Ambulates with a cane  Skin: Skin is warm and dry. No rash noted.  Psychiatric: She has a normal mood and affect. Thought content normal.          Assessment & Plan:  Please refer to separate problem-list charting for more details.

## 2014-07-05 NOTE — Progress Notes (Signed)
Internal Medicine Clinic Attending  Case discussed with Dr. Ngo at the time of the visit.  We reviewed the resident's history and exam and pertinent patient test results.  I agree with the assessment, diagnosis, and plan of care documented in the resident's note. 

## 2014-08-08 ENCOUNTER — Encounter: Payer: Self-pay | Admitting: Internal Medicine

## 2014-08-08 ENCOUNTER — Ambulatory Visit (INDEPENDENT_AMBULATORY_CARE_PROVIDER_SITE_OTHER): Payer: Medicare Other | Admitting: Internal Medicine

## 2014-08-08 VITALS — BP 158/65 | HR 60 | Temp 99.0°F | Ht 63.0 in | Wt 182.1 lb

## 2014-08-08 DIAGNOSIS — I10 Essential (primary) hypertension: Secondary | ICD-10-CM

## 2014-08-08 DIAGNOSIS — E1140 Type 2 diabetes mellitus with diabetic neuropathy, unspecified: Secondary | ICD-10-CM | POA: Diagnosis not present

## 2014-08-08 LAB — GLUCOSE, CAPILLARY: Glucose-Capillary: 101 mg/dL — ABNORMAL HIGH (ref 70–99)

## 2014-08-08 NOTE — Progress Notes (Signed)
   Subjective:    Patient ID: Wanda Mclaughlin, female    DOB: [birth year], 81 y.o.   MRN: [id number]  HPI  Patient is an 81 year old with a history of diabetes, hypertension, hyperlipidemia who presents to clinic for a follow-up of hypertension and diabetes.  Please see problem based charting for more details.  Review of Systems  Constitutional: Negative for fever and chills.  HENT: Negative for rhinorrhea and sore throat.   Eyes: Negative for visual disturbance.  Respiratory: Negative for cough and shortness of breath.   Cardiovascular: Negative for chest pain and palpitations.  Gastrointestinal: Negative for nausea, vomiting, abdominal pain, diarrhea, constipation and blood in stool.  Genitourinary: Negative for dysuria and hematuria.  Neurological: Negative for syncope.       Objective:   Physical Exam  Constitutional: She is oriented to person, place, and time. She appears well-developed and well-nourished. No distress.  HENT:  Head: Normocephalic and atraumatic.  Eyes: EOM are normal. Pupils are equal, round, and reactive to light.  Neck: Normal range of motion. Neck supple. No thyromegaly present.  Cardiovascular: Normal rate and regular rhythm.  Exam reveals no gallop and no friction rub.   No murmur heard. Pulmonary/Chest: Effort normal and breath sounds normal. No respiratory distress. She has no wheezes. She has no rales.  Abdominal: Soft. Bowel sounds are normal. She exhibits no distension. There is no tenderness. There is no rebound.  Musculoskeletal: She exhibits no edema.  Neurological: She is alert and oriented to person, place, and time. No cranial nerve deficit.  Skin: Skin is warm and dry. No rash noted.  Psychiatric: She has a normal mood and affect. Thought content normal.          Assessment & Plan:  Please see problem based charting for more details.

## 2014-08-08 NOTE — Patient Instructions (Addendum)
Please continue doing a great job in controlling your diabetes and blood pressure. If you experience any symptoms of swelling in her lips or tongue, please seek medical attention immediately.  Please take all of your medications as prescribed.  General Instructions:   Thank you for bringing your medicines today. This helps us keep you safe from mistakes.   Progress Toward Treatment Goals:  Treatment Goal 07/04/2014  Hemoglobin A1C unable to assess  Blood pressure deteriorated    Self Care Goals & Plans:  Self Care Goal 08/08/2014  Manage my medications take my medicines as prescribed; bring my medications to every visit; refill my medications on time  Monitor my health -  Eat healthy foods drink diet soda or water instead of juice or soda; eat more vegetables; eat foods that are low in salt; eat baked foods instead of fried foods  Be physically active -    Home Blood Glucose Monitoring 02/22/2014  Check my blood sugar once a day  When to check my blood sugar before breakfast     Care Management & Community Referrals:  Referral 02/22/2014  Referrals made for care management support none needed

## 2014-08-08 NOTE — Assessment & Plan Note (Signed)
Lab Results  Component Value Date   HGBA1C 5.9 07/04/2014   HGBA1C 5.9 04/04/2014   HGBA1C 6.0 01/14/2014     Assessment: Diabetes control: good control (HgbA1C at goal) Progress toward A1C goal:  unchanged Comments: Patient states that she is compliant with her metformin 500 mg twice a day. Review of her glucometer readings show blood glucose readings in the normal range.  Plan: Medications:  continue current medications Instruction/counseling given: reminded to bring blood glucose meter & log to each visit Educational resources provided: brochure (denies) Self management tools provided: copy of home glucose meter download, home glucose logbook Other plans: none

## 2014-08-08 NOTE — Progress Notes (Signed)
Internal Medicine Clinic Attending  Case discussed with Dr. Ngo at the time of the visit.  We reviewed the resident's history and exam and pertinent patient test results.  I agree with the assessment, diagnosis, and plan of care documented in the resident's note. 

## 2014-08-08 NOTE — Assessment & Plan Note (Addendum)
BP Readings from Last 3 Encounters:  08/08/14 158/65  07/04/14 166/70  04/04/14 153/76   Please note that recheck of blood pressure today was in the 135/80.   Lab Results  Component Value Date   NA 139 01/14/2014   K 4.4 01/14/2014   CREATININE 1.21* 01/14/2014    Assessment: Blood pressure control: controlled Progress toward BP goal:  improved Comments: Patient states that she is compliant with her hydralazine 25 mg 3 times a day, atenolol 50 mg daily, hydrochlorothiazide 12.5 mg daily, and losartan 25 mg daily. Patient denies any symptoms of lip swelling or tongue swelling with the newly started losartan. Patient does have a history of angioedema with lisinopril.  Plan: Medications:  continue current medications Educational resources provided: brochure (denies) Self management tools provided:   Other plans: Consider recheck of basic metabolic panel at next visit.

## 2014-08-11 DIAGNOSIS — E119 Type 2 diabetes mellitus without complications: Secondary | ICD-10-CM | POA: Diagnosis not present

## 2014-08-13 ENCOUNTER — Other Ambulatory Visit: Payer: Self-pay | Admitting: Internal Medicine

## 2014-08-29 ENCOUNTER — Other Ambulatory Visit: Payer: Self-pay | Admitting: *Deleted

## 2014-08-29 MED ORDER — METOCLOPRAMIDE HCL 5 MG PO TABS: 5.0000 mg | ORAL_TABLET | Freq: Three times a day (TID) | ORAL | Status: DC

## 2014-09-06 ENCOUNTER — Encounter: Payer: Self-pay | Admitting: *Deleted

## 2014-09-07 ENCOUNTER — Other Ambulatory Visit: Payer: Self-pay | Admitting: Internal Medicine

## 2014-09-22 ENCOUNTER — Encounter: Payer: Self-pay | Admitting: Gastroenterology

## 2014-09-29 ENCOUNTER — Other Ambulatory Visit: Payer: Self-pay | Admitting: Internal Medicine

## 2014-11-29 ENCOUNTER — Ambulatory Visit (INDEPENDENT_AMBULATORY_CARE_PROVIDER_SITE_OTHER): Payer: Medicare Other | Admitting: Internal Medicine

## 2014-11-29 ENCOUNTER — Encounter: Payer: Self-pay | Admitting: Internal Medicine

## 2014-11-29 VITALS — BP 164/66 | HR 60 | Temp 99.4°F | Wt 186.8 lb

## 2014-11-29 DIAGNOSIS — E1143 Type 2 diabetes mellitus with diabetic autonomic (poly)neuropathy: Secondary | ICD-10-CM

## 2014-11-29 DIAGNOSIS — K3184 Gastroparesis: Secondary | ICD-10-CM

## 2014-11-29 DIAGNOSIS — E785 Hyperlipidemia, unspecified: Secondary | ICD-10-CM | POA: Diagnosis not present

## 2014-11-29 DIAGNOSIS — M1990 Unspecified osteoarthritis, unspecified site: Secondary | ICD-10-CM

## 2014-11-29 DIAGNOSIS — M170 Bilateral primary osteoarthritis of knee: Secondary | ICD-10-CM

## 2014-11-29 DIAGNOSIS — E1140 Type 2 diabetes mellitus with diabetic neuropathy, unspecified: Secondary | ICD-10-CM

## 2014-11-29 DIAGNOSIS — N182 Chronic kidney disease, stage 2 (mild): Secondary | ICD-10-CM

## 2014-11-29 LAB — POCT GLYCOSYLATED HEMOGLOBIN (HGB A1C): HEMOGLOBIN A1C: 6.3

## 2014-11-29 LAB — GLUCOSE, CAPILLARY: GLUCOSE-CAPILLARY: 97 mg/dL (ref 65–99)

## 2014-11-29 MED ORDER — HYDROCODONE-ACETAMINOPHEN 7.5-325 MG PO TABS: 1.0000 | ORAL_TABLET | Freq: Four times a day (QID) | ORAL | Status: DC | PRN

## 2014-11-29 NOTE — Assessment & Plan Note (Deleted)
Needs repeat metabolic panel at next clinic appt.

## 2014-11-29 NOTE — Progress Notes (Signed)
Internal Medicine Clinic Attending  I saw and evaluated the patient.  I personally confirmed the key portions of the history and exam documented by Dr. Rice and I reviewed pertinent patient test results.  The assessment, diagnosis, and plan were formulated together and I agree with the documentation in the resident's note.  

## 2014-11-29 NOTE — Assessment & Plan Note (Signed)
On metaclopramide  QIDWM. She has no episodes of nausea or vomiting since last visit. Unusual presentation with setting of well controlled diabetes and limited peripheral neuropathy.

## 2014-11-29 NOTE — Assessment & Plan Note (Addendum)
Last 3 HgbA1c:  11/29/2014 6.3 07/04/2014 5.9 04/04/2014 5.9  Up to date on diabetes maintenance with reported opthalmologist visit this year. No progression of neurological symptoms. Poor gait may or may not be related, as distal lower extremity strength, circulation, and sensation are appropriate. Good control on  metformin BID. Reported diet is relatively low on vegetable and meat content.  - Continue metformin  BID - Outpatient referral to physical therapy for cane/walker home equipment needs assessment

## 2014-11-29 NOTE — Assessment & Plan Note (Signed)
Patient is at goal LDL on  atorvastatin. However given no history of CVA or MI, the benefit of primary prevention in a 81 y/o is ambiguous. Given recent complaint of leg cramping and risk of complications from polypharmacy discontinuing statin therapy at this time is most appropriate.  - Discontinue Lipitor 

## 2014-11-29 NOTE — Patient Instructions (Signed)
Today we discontinued your Lipitor (atorvastatin)  daily medication.  Please be careful walking at home and use your cane when able. Follow up with Physical Therapy for advice on walking safety and recommendations for home equipment.  Please come to your next appointment fasting since midnight the night before for several blood tests.

## 2014-11-29 NOTE — Progress Notes (Signed)
Patient ID: Wanda Mclaughlin, female   DOB: Dec 30, 1928, 81 y.o.   MRN: [id number]   Subjective:   Patient ID: Wanda Mclaughlin female   DOB: 1929/04/16 81 y.o.   MRN: [id number]  HPI: [name] is a 81 y.o. woman with history of hypertension, type 2 diabetes, chronic kidney disease, hyperlipidemia, osteoporosis, degenerative joint disease, and anemia who presents to clinic for diabetes monitoring and medication refill. She reports seeing an opthalmologist within the past year. She has had no progression of peripheral neuropathy since her last visit. She continues to ambulate at home with a wooden cane. She has all of her prescribed medications available except her 7.5mg  vicodin for arthritic pain which she has managed with our clinic.   Past Medical History  Diagnosis Date  . Diabetic peripheral neuropathy   . Chronic renal failure   . Chronic knee pain   . Degenerative joint disease   . Hypertension   . Hyperlipidemia   . Vertigo   . Duodenitis   . Gastritis   . Anemia     Normal EGG, colonoscopy, and Sm. Bowel F/T 2006  . Health maintenance examination     Mammogram 10/11: Possible mass, right breast. No evidence of malignancy in left breast. Needle biopsy of left breast 12/11: No evidence of  carcinoma. Patient needs follow-up right breast diagnostic mammogram with ultrasound in 6 months.  . Diabetes mellitus 11/05    type II  . Osteoporosis 09/2009    T score -2.8   Current Outpatient Prescriptions  Medication Sig Dispense Refill  . aspirin EC 81 MG tablet Take 1 tablet (81 mg total) by mouth daily. 150 tablet 2  . atenolol (TENORMIN) 50 MG tablet Take 1 tablet (50 mg total) by mouth daily. 90 tablet 3  . atorvastatin (LIPITOR) 10 MG tablet Take 1 tablet (10 mg total) by mouth daily. 90 tablet 3  . calcium-vitamin D ([employer] OYSTER SHELL CALCIUM-VIT D) 500-200 MG-UNIT per tablet Take 1 tablet by mouth 3 (three) times daily. 100 tablet 11  . ferrous sulfate 325 (65 FE) MG tablet TAKE  1 TABLET BY MOUTH EVERY DAY 30 tablet 10  . gabapentin (NEURONTIN) 600 MG tablet TAKE 1 TABLET BY MOUTH 3 TIMES A DAY 180 tablet 3  . glucose blood (ACCU-CHEK AVIVA PLUS) test strip 1 each by Other route See admin instructions. Use as instructed 100 each 3  . glucose blood (ONE TOUCH TEST STRIPS) test strip Use to test blood sugar 3 times a day as directed 100 each 11  . hydrALAZINE (APRESOLINE) 25 MG tablet TAKE 1 TABLET BY MOUTH 3 TIMES A DAY 90 tablet 1  . hydrochlorothiazide (MICROZIDE) 12.5 MG capsule Take 1 capsule (12.5 mg total) by mouth daily. 30 capsule 11  . HYDROcodone-acetaminophen (NORCO) 7.5-325 MG per tablet Take 1 tablet by mouth every 6 (six) hours as needed for moderate pain. 120 tablet 0  . losartan (COZAAR) 25 MG tablet Take 1 tablet (25 mg total) by mouth daily. 30 tablet 11  . metFORMIN (GLUCOPHAGE) 500 MG tablet TAKE 1 TABLET BY MOUTH TWICE A DAY WITH MEALS 60 tablet 6  . metoCLOPramide (REGLAN) 5 MG tablet Take 1 tablet (5 mg total) by mouth 4 (four) times daily -  before meals and at bedtime. 120 tablet 6  . ONE TOUCH ULTRA TEST test strip TEST 3 TIMES A DAY AS DIRECTED 100 each 3  . senna (SENOKOT) 8.6 MG tablet Take 2-4 tablets by mouth at bedtime  as needed for constipation. 30 tablet 5   No current facility-administered medications for this visit.   Family History  Problem Relation Age of Onset  . Diabetes Sister    History   Social History  . Marital Status: Widowed    Spouse Name: N/A  . Number of Children: 5  . Years of Education: 10th grade   Occupational History  . cook     now retired   Social History Main Topics  . Smoking status: Never Smoker   . Smokeless tobacco: Never Used  . Alcohol Use: No  . Drug Use: No  . Sexual Activity: Not on file   Other Topics Concern  . Not on file   Social History Narrative   Lives by herself.   Daughter lives near by and help her with groceries.   Given diabetes card 05/03/2010   Review of  Systems: Review of Systems  Constitutional: Negative for fever, weight loss and malaise/fatigue.  HENT: Positive for hearing loss.   Eyes: Negative for blurred vision and double vision.  Respiratory: Negative for wheezing.   Cardiovascular: Negative for chest pain and leg swelling.  Gastrointestinal: Positive for heartburn. Negative for nausea, vomiting and abdominal pain.  Genitourinary: Negative for dysuria and frequency.  Musculoskeletal: Positive for myalgias and joint pain.  Neurological: Negative for dizziness, focal weakness and headaches.    Objective:  Physical Exam: Filed Vitals:   11/29/14 1019  BP: 165/72  Pulse: 65  Temp: 99.4 F (37.4 C)  TempSrc: Oral  Weight: 186 lb 12.8 oz (84.732 kg)  SpO2: 96%   GENERAL- alert, co-operative, appears as stated age, NAD HEENT- Oral mucosa appears moist, hearing aid in left ear CARDIAC- RRR, no murmurs, rubs or gallops. RESP- CTAB, no wheezes or crackles. ABDOMEN- Soft, nontender, no guarding or rebound NEURO- No obvious Cr N abnormality, sensation intact to light touch in feet b/l, mild gait instability with cane EXTREMITIES- DP and PT pulses 2+, symmetric, no pedal edema. SKIN- Warm, dry, No rash or lesion. PSYCH- Normal mood and affect, appropriate thought content and speech.   Assessment & Plan:   Patient will need repeat labwork at her next followup. CBC, Cmet, fasting lipid panel.

## 2014-11-29 NOTE — Assessment & Plan Note (Signed)
Refilled Vicodin #120 per 30 days x3 refills. First fill date 11/29/14 do not refill again before 02/26/15.

## 2014-12-14 ENCOUNTER — Other Ambulatory Visit: Payer: Self-pay | Admitting: Internal Medicine

## 2015-01-03 ENCOUNTER — Other Ambulatory Visit: Payer: Self-pay | Admitting: Internal Medicine

## 2015-01-23 ENCOUNTER — Other Ambulatory Visit: Payer: Self-pay | Admitting: Internal Medicine

## 2015-02-03 ENCOUNTER — Ambulatory Visit (INDEPENDENT_AMBULATORY_CARE_PROVIDER_SITE_OTHER): Payer: Medicare Other | Admitting: Internal Medicine

## 2015-02-03 ENCOUNTER — Encounter: Payer: Self-pay | Admitting: Internal Medicine

## 2015-02-03 VITALS — BP 205/83 | HR 62 | Temp 98.7°F | Ht 63.0 in | Wt 187.6 lb

## 2015-02-03 DIAGNOSIS — M170 Bilateral primary osteoarthritis of knee: Secondary | ICD-10-CM

## 2015-02-03 DIAGNOSIS — Z23 Encounter for immunization: Secondary | ICD-10-CM

## 2015-02-03 DIAGNOSIS — E1040 Type 1 diabetes mellitus with diabetic neuropathy, unspecified: Secondary | ICD-10-CM

## 2015-02-03 DIAGNOSIS — I10 Essential (primary) hypertension: Secondary | ICD-10-CM | POA: Diagnosis not present

## 2015-02-03 DIAGNOSIS — Z0000 Encounter for general adult medical examination without abnormal findings: Secondary | ICD-10-CM

## 2015-02-03 DIAGNOSIS — Z794 Long term (current) use of insulin: Secondary | ICD-10-CM

## 2015-02-03 DIAGNOSIS — E1140 Type 2 diabetes mellitus with diabetic neuropathy, unspecified: Secondary | ICD-10-CM

## 2015-02-03 LAB — GLUCOSE, CAPILLARY: GLUCOSE-CAPILLARY: 100 mg/dL — AB (ref 65–99)

## 2015-02-03 LAB — POCT GLYCOSYLATED HEMOGLOBIN (HGB A1C): HEMOGLOBIN A1C: 6

## 2015-02-03 MED ORDER — HYDROCODONE-ACETAMINOPHEN 7.5-325 MG PO TABS: 1.0000 | ORAL_TABLET | Freq: Four times a day (QID) | ORAL | Status: AC | PRN

## 2015-02-03 MED ORDER — HYDROCODONE-ACETAMINOPHEN 7.5-325 MG PO TABS: 1.0000 | ORAL_TABLET | Freq: Four times a day (QID) | ORAL | Status: DC | PRN

## 2015-02-03 NOTE — Assessment & Plan Note (Signed)
-   Influenza vaccine given today 

## 2015-02-03 NOTE — Assessment & Plan Note (Signed)
Discussed with patient her current mobility and ability to accomplish acitivies of daily living. She reports still having a mild to moderate amount of pain, but is functional and completes all routine activities successfully. Current regimen is probably appropriate for continuing. She has been referred to PT for evaluation and change of assistive device as fall risk is very concerning for this situation. Appointment exists for 10/27, and strongly reinforced to patient the importance of attending. - Placed new future script for vicodin #120 tabs, to be filled after 11/29 - PT appt at 10/27 is already pending

## 2015-02-03 NOTE — Progress Notes (Signed)
Subjective:   Patient ID: Wanda Mclaughlin female   DOB: 1928-09-16 81 y.o.   MRN: [id number]  HPI: [name] is a 81 y.o. with PMHx as described below presenting for follow up of diabetes, hypertension, and her chronic bilateral knee pain. Her knee pain continues to be significant, but she is able to walk with a cane when taking her pain medications. She reports that she still experiences a fair amount of pain worsened by prolonged standing but is able to complete all routine daily activities. She does feel occasionally weak while walking but has not fallen. She does not currently use any alternative therapies for pain relief besides her prescription Norco.  See problem based assessment and plan for additional details.  Past Medical History  Diagnosis Date  . Diabetic peripheral neuropathy (HCC)   . Chronic renal failure   . Chronic knee pain   . Degenerative joint disease   . Hypertension   . Hyperlipidemia   . Vertigo   . Duodenitis   . Gastritis   . Anemia     Normal EGG, colonoscopy, and Sm. Bowel F/T 2006  . Health maintenance examination     Mammogram 10/11: Possible mass, right breast. No evidence of malignancy in left breast. Needle biopsy of left breast 12/11: No evidence of  carcinoma. Patient needs follow-up right breast diagnostic mammogram with ultrasound in 6 months.  . Diabetes mellitus 11/05    type II  . Osteoporosis 09/2009    T score -2.8   Current Outpatient Prescriptions  Medication Sig Dispense Refill  . aspirin EC 81 MG tablet Take 1 tablet (81 mg total) by mouth daily. 150 tablet 2  . atenolol (TENORMIN) 50 MG tablet TAKE 1 TABLET BY MOUTH EVERY DAY 90 tablet 3  . atorvastatin (LIPITOR) 10 MG tablet TAKE 1 TABLET BY MOUTH EVERY DAY 90 tablet 3  . calcium-vitamin D ([employer] OYSTER SHELL CALCIUM-VIT D) 500-200 MG-UNIT per tablet Take 1 tablet by mouth 3 (three) times daily. 100 tablet 11  . ferrous sulfate 325 (65 FE) MG tablet TAKE 1 TABLET BY MOUTH EVERY  DAY 30 tablet 10  . gabapentin (NEURONTIN) 600 MG tablet TAKE 1 TABLET BY MOUTH 3 TIMES A DAY 180 tablet 3  . glucose blood (ACCU-CHEK AVIVA PLUS) test strip 1 each by Other route See admin instructions. Use as instructed 100 each 3  . glucose blood (ONE TOUCH TEST STRIPS) test strip Use to test blood sugar 3 times a day as directed 100 each 11  . hydrALAZINE (APRESOLINE) 25 MG tablet TAKE 1 TABLET BY MOUTH 3 TIMES A DAY 90 tablet 1  . hydrochlorothiazide (MICROZIDE) 12.5 MG capsule TAKE 1 CAPSULE (12.5 MG TOTAL) BY MOUTH DAILY. 30 capsule 11  . [START ON 03/29/2015] HYDROcodone-acetaminophen (NORCO) 7.5-325 MG tablet Take 1 tablet by mouth every 6 (six) hours as needed for moderate pain or severe pain. 120 tablet 0  . losartan (COZAAR) 25 MG tablet Take 1 tablet (25 mg total) by mouth daily. 30 tablet 11  . metFORMIN (GLUCOPHAGE) 500 MG tablet TAKE 1 TABLET BY MOUTH TWICE A DAY WITH MEALS 60 tablet 6  . metoCLOPramide (REGLAN) 5 MG tablet Take 1 tablet (5 mg total) by mouth 4 (four) times daily -  before meals and at bedtime. 120 tablet 6  . omeprazole (PRILOSEC) 20 MG capsule TAKE ONE CAPSULE BY MOUTH EVERY DAY 90 capsule 3  . ONE TOUCH ULTRA TEST test strip TEST 3 TIMES A DAY  AS DIRECTED 100 each 3  . senna (SENOKOT) 8.6 MG tablet Take 2-4 tablets by mouth at bedtime as needed for constipation. 30 tablet 5   No current facility-administered medications for this visit.   Family History  Problem Relation Age of Onset  . Diabetes Sister    Social History   Social History  . Marital Status: Widowed    Spouse Name: N/A  . Number of Children: 5  . Years of Education: 10th grade   Occupational History  . cook     now retired   Social History Main Topics  . Smoking status: Never Smoker   . Smokeless tobacco: Never Used  . Alcohol Use: No  . Drug Use: No  . Sexual Activity: Not Asked   Other Topics Concern  . None   Social History Narrative   Lives by herself.   Daughter lives  near by and help her with groceries.   Given diabetes card 05/03/2010   Review of Systems: Review of Systems  Eyes: Negative for blurred vision.  Respiratory: Positive for shortness of breath.   Cardiovascular: Positive for leg swelling.  Gastrointestinal: Negative for diarrhea and constipation.  Musculoskeletal: Positive for back pain and joint pain. Negative for falls.  Skin: Negative for rash.  Neurological: Positive for weakness. Negative for dizziness and headaches.  Psychiatric/Behavioral: The patient does not have insomnia.     Objective:  Physical Exam: Filed Vitals:   02/03/15 1609  BP: 205/83  Pulse: 62  Temp: 98.7 F (37.1 C)  TempSrc: Oral  Height:  (1.6 m)  Weight: 187 lb 9.6 oz (85.095 kg)  SpO2: 96%   GENERAL- alert, co-operative, appears as stated age, NAD HEENT- Oral mucosa appears moist, hearing aid in left ear but still notably hard of hearing CARDIAC- RRR, no murmurs, rubs or gallops. RESP- CTAB, no wheezes or crackles. ABDOMEN- Soft, nontender, no guarding or rebound NEURO- No obvious Cr N abnormality, mild gait instability with cane EXTREMITIES- Knees with bony deformity b/l, without effusion, no pedal edema. SKIN- Warm, dry, No rash or lesion. PSYCH- Normal mood and affect, appropriate thought content and speech.  Assessment & Plan:

## 2015-02-03 NOTE — Patient Instructions (Addendum)
Keep up the good work, our blood sugars are excellent!  Continue taking all your blood pressure medications as directed, every day.  Attend physical therapy appointment as instructed for safety recommendations and equipment.  Follow up with [place] in 3 months or as needed.

## 2015-02-03 NOTE — Assessment & Plan Note (Signed)
BP Readings from Last 3 Encounters:  02/03/15 180/80  11/29/14 164/66  08/08/14 158/65    Lab Results  Component Value Date   NA 139 01/14/2014   K 4.4 01/14/2014   CREATININE 1.21* 01/14/2014    Assessment: Blood pressure control: Not controlled Progress toward BP goal:  Worse Comments: Patient reports not taking her antihypertensives today besides 1 dose hydralazine. Says this is common for her as she needs to urinate more frequently when taking all medications.  Plan: Medications:  Continue Atenolol , hydrochlorothiazide 12.5mg , hydralazine  TID, losartan , but patient needs to actually take these to get any benefit. Discussed with her importance of every day therapy in order to achieve protective benefit. Educational resources provided:   Self management tools provided:   Other plans: Will look into requesting BP cuff, patient reports not having one at home

## 2015-02-03 NOTE — Assessment & Plan Note (Signed)
Lab Results  Component Value Date   HGBA1C 6.0 02/03/2015   HGBA1C 6.3 11/29/2014   HGBA1C 5.9 07/04/2014     Assessment: Diabetes control: Controlled Progress toward A1C goal:  At goal Comments: Doing well on metformin  BID currently  Plan: Medications:  Continue metformin  BID Home glucose monitoring: Frequency: ~1/day Timing:   Instruction/counseling given: Reminded to bring meter to appts Educational resources provided: Copy of glucometer printout provided Self management tools provided:   Other plans:

## 2015-02-06 NOTE — Addendum Note (Signed)
Addended by: Erlinda Hong T on: 02/06/2015 08:00 AM   Modules accepted: Level of Service, SmartSet

## 2015-02-06 NOTE — Progress Notes (Signed)
Internal Medicine Clinic Attending  I saw and evaluated the patient.  I personally confirmed the key portions of the history and exam documented by Dr. Dimple Casey and I reviewed pertinent patient test results.  The assessment, diagnosis, and plan were formulated together and I agree with the documentation in the resident's note.  Elderly patient with good family support but remains high risk for falls. Scheduled for PT for gait training and DME evaluation next week. We discussed chronic pain control, source is osteoarthritis of the knees, treated with vicodin, good functionality, no adverse effects currently, benefits still seem to outweigh the risks. Blood pressure is uncontrolled because of medication non-compliance today. Plan to resume triple therapy anti-hypertensives, check home readings and call us if remain over systolic 140.

## 2015-02-23 ENCOUNTER — Ambulatory Visit: Payer: Medicare Other | Attending: Internal Medicine | Admitting: Rehabilitation

## 2015-02-23 ENCOUNTER — Encounter: Payer: Self-pay | Admitting: Rehabilitation

## 2015-02-23 ENCOUNTER — Telehealth: Payer: Self-pay | Admitting: Rehabilitation

## 2015-02-23 DIAGNOSIS — R531 Weakness: Secondary | ICD-10-CM | POA: Insufficient documentation

## 2015-02-23 DIAGNOSIS — M179 Osteoarthritis of knee, unspecified: Secondary | ICD-10-CM | POA: Diagnosis not present

## 2015-02-23 DIAGNOSIS — M1712 Unilateral primary osteoarthritis, left knee: Secondary | ICD-10-CM

## 2015-02-23 NOTE — Therapy (Signed)
[facility] [address] [place], [place], [address] Phone: [phone]   Fax:  [phone]  Physical Therapy Evaluation  Patient Details  Name: Wanda Mclaughlin MRN: [id number] Date of Birth: 1929/03/08 Referring Provider: Sheliah Hatch, MD  Encounter Date: 02/23/2015      PT End of Session - 02/23/15 1302    Visit Number 1   Number of Visits 1  eval only   Authorization Type UHC MCR, MCD secondary   PT Start Time 1105   PT Stop Time 1150   PT Time Calculation (min) 45 min   Activity Tolerance Patient tolerated treatment well   Behavior During Therapy [facility] for tasks assessed/performed      Past Medical History  Diagnosis Date  . Diabetic peripheral neuropathy (HCC)   . Chronic renal failure   . Chronic knee pain   . Degenerative joint disease   . Hypertension   . Hyperlipidemia   . Vertigo   . Duodenitis   . Gastritis   . Anemia     Normal EGG, colonoscopy, and Sm. Bowel F/T 2006  . Health maintenance examination     Mammogram 10/11: Possible mass, right breast. No evidence of malignancy in left breast. Needle biopsy of left breast 12/11: No evidence of  carcinoma. Patient needs follow-up right breast diagnostic mammogram with ultrasound in 6 months.  . Diabetes mellitus 11/05    type II  . Osteoporosis 09/2009    T score -2.8    Past Surgical History  Procedure Laterality Date  . Abdominal hysterectomy      1980's (Fibroids)  .  right needle localized lumpectomy.   12/11    Path report:FOCAL ATYPICAL DUCTAL HYPERPLASIA IN A BACKGROUND OF STROMAL  FIBROSIS. No evidence of carcinoma.   . Colonoscopy      8/11 by Dr. Christella Hartigan: Normal colon, Given your age, you will not need another colonoscopy for colon cancer screening or polyp surveillance    There were no vitals filed for this visit.  Visit Diagnosis:  Osteoarthritis of left knee, unspecified osteoarthritis type - Plan: PT plan of care  cert/re-cert  Generalized weakness - Plan: PT plan of care cert/re-cert      Subjective Assessment - 02/23/15 1112    Subjective "Walking is a little harder because of my knee pain."     Limitations Standing   Patient Stated Goals "to walk better"    Currently in Pain? Yes   Pain Score 4    Pain Location Knee   Pain Orientation Left   Pain Descriptors / Indicators Aching   Pain Type Chronic pain   Pain Onset More than a month ago   Aggravating Factors  standing and walking   Pain Relieving Factors pain medication, sitting            [facility] PT Assessment - 02/23/15 0001    Assessment   Medical Diagnosis gait abnormality   Referring Provider Sheliah Hatch, MD   Onset Date/Surgical Date --  per daughter report, she has been unsteady with cane for yea   Precautions   Precautions Fall   Restrictions   Weight Bearing Restrictions No   Balance Screen   Has the patient fallen in the past 6 months No   Has the patient had a decrease in activity level because of a fear of falling?  No   Is the patient reluctant to leave their home because of a fear of falling?  No   Home  [employer] residence   Living Arrangements Alone   Available Help at Discharge Family;Available PRN/intermittently  daughter brings pt to her house, but pt alone at night   Type of Home Apartment   Home Access Stairs to enter   Entrance Stairs-Number of Steps 2   Entrance Stairs-Rails Left   Home Layout One level   Home Equipment Keiser - single point   Prior Function   Level of Independence Independent with household mobility with device;Independent with community mobility with device  daughter with her when shopping   Vocation Retired   Leisure wants to be able to walk longer distances   Cognition   Overall Cognitive Status Within Functional Limits for tasks assessed   Sensation   Light Touch Appears Intact  does have hx of peripheral neuropathy   Proprioception  Appears Intact   Coordination   Gross Motor Movements are Fluid and Coordinated Yes   Fine Motor Movements are Fluid and Coordinated Yes   ROM / Strength   AROM / PROM / Strength Strength   Strength   Overall Strength Deficits   Overall Strength Comments grossly 4-5/5 strength throughout BLEs.    Transfers   Transfers Sit to Stand;Stand to Sit   Sit to Stand 6: Modified independent (Device/Increase time)   Five time sit to stand comments  27.72 secs   Stand to Sit 6: Modified independent (Device/Increase time)   Ambulation/Gait   Ambulation/Gait Yes   Ambulation/Gait Assistance 6: Modified independent (Device/Increase time)   Ambulation Distance (Feet) 115 Feet   Assistive device Rollator;Straight cane;Rolling walker   Gait Pattern Step-through pattern;Decreased stride length;Decreased stance time - left;Antalgic  tends to keep L knee locked out due to pain, can correct   Ambulation Surface Level;Indoor   Gait velocity 1.57 ft/sec   Stairs Yes   Stairs Assistance 5: Supervision   Stair Management Technique One rail Left;Step to pattern;Forwards   Number of Stairs 4   Height of Stairs 6   Standardized Balance Assessment   Standardized Balance Assessment Berg Balance Test   Berg Balance Test   Sit to Stand Able to stand without using hands and stabilize independently   Standing Unsupported Able to stand safely 2 minutes   Sitting with Back Unsupported but Feet Supported on Floor or Stool Able to sit safely and securely 2 minutes   Stand to Sit Sits safely with minimal use of hands   Transfers Able to transfer safely, minor use of hands   Standing Unsupported with Eyes Closed Able to stand 10 seconds safely   Standing Ubsupported with Feet Together Able to place feet together independently and stand 1 minute safely   From Standing, Reach Forward with Outstretched Arm Can reach forward >12 cm safely (5")   From Standing Position, Pick up Object from Floor Able to pick up shoe  safely and easily   From Standing Position, Turn to Look Behind Over each Shoulder Looks behind from both sides and weight shifts well   Turn 360 Degrees Able to turn 360 degrees safely but slowly   Standing Unsupported, Alternately Place Feet on Step/Stool Able to stand independently and complete 8 steps >20 seconds   Standing Unsupported, One Foot in Front Able to plae foot ahead of the other independently and hold 30 seconds   Standing on One Leg Tries to lift leg/unable to hold 3 seconds but remains standing independently   Total Score 48  PT Education - 02/23/15 1300    Education provided Yes   Education Details Provided education on evaluation findings, and on ordering a rollator for community use.    Person(s) Educated Patient;Child(ren)   Methods Explanation;Demonstration   Comprehension Verbalized understanding;Returned demonstration                    Plan - 02/23/15 1303    Clinical Impression Statement Pt presents with long standing chronic L knee pain from OA (has in both knees L>R) causing gait abnormality and antalgic gait.  Pt tends to keep L leg "stiff" with gait however did not note any overt LOB during gait.  Did trial use of RW with improvement noted in knee pain and also trialed use of rollator for community use as she is able to sit on rollator when pain is increased.  Also feel that rollator helped to increase gait speed which will decrease fall risk.  Will place telephone encounter to MD to order and have them notify pt when able to pick up at [facility].  Pt with score of 48/56 on BERG balance test indicative of no fall risk.  She does has decreased gait speed, but no gross instability noted, therefore will not pick pt up for formal PT.  Please feel free to re-order PT if pts status changes.     Consulted and Agree with Plan of Care Patient;Family member/caregiver   Family Member Consulted daughter           G-Codes - 02/23/15 1306    Functional Assessment Tool Used BERG 48/56   Functional Limitation Mobility: Walking and moving around   Mobility: Walking and Moving Around Current Status [phone]([id number]) At least 1 percent but less than 20 percent impaired, limited or restricted   Mobility: Walking and Moving Around Goal Status [phone](G8979) At least 1 percent but less than 20 percent impaired, limited or restricted   Mobility: Walking and Moving Around Discharge Status [phone](G8980) At least 1 percent but less than 20 percent impaired, limited or restricted       Problem List Patient Active Problem List   Diagnosis Date Noted  . Health care maintenance 03/15/2013  . Diabetic gastroparesis (HCC) 08/02/2011  . OSTEOPOROSIS 09/29/2009  . RENAL DISEASE, CHRONIC, MILD 08/29/2008  . KNEE PAIN, LEFT, CHRONIC 11/30/2007  . ANEMIA NOS 10/20/2006  . Diabetes mellitus with neuropathy (HCC) 02/13/2006  . Hyperlipidemia 02/13/2006  . Essential hypertension 02/13/2006  . Gastritis and gastroduodenitis 02/13/2006  . Osteoarthritis 02/13/2006    Harriet ButteEmily Lunette Tapp, PT, MPT [facility] [address] [place], [place], [address] Phone: [phone]   Fax:  [phone] 02/23/2015, 1:09 PM  Name: Dan HumphreysHazel L Mclaughlin MRN: [id number] Date of Birth: April 06, 1929

## 2015-02-23 NOTE — Telephone Encounter (Signed)
Following PT evaluation, do not feel that pt needs further follow up at this time, however do feel that she would greatly benefit from use of rollator in community to increase gait speed, decrease fall risk and decrease L knee pain.  Pt will not follow up at this location, therefore please fax order for rollator to [facility] and notify pt so that she will be able to pick this up.     Thank you for the referral! Harriet ButteEmily Aaryn Parrilla, PT, MPT [facility] [address] [place], [place], [address] Phone: [phone]   Fax:  [phone] 02/23/2015, 1:12 PM

## 2015-03-31 ENCOUNTER — Other Ambulatory Visit: Payer: Self-pay

## 2015-03-31 MED ORDER — HYDRALAZINE HCL 25 MG PO TABS: 25.0000 mg | ORAL_TABLET | Freq: Three times a day (TID) | ORAL | Status: DC

## 2015-03-31 MED ORDER — METFORMIN HCL 500 MG PO TABS: 500.0000 mg | ORAL_TABLET | Freq: Two times a day (BID) | ORAL | Status: DC

## 2015-05-01 ENCOUNTER — Other Ambulatory Visit: Payer: Self-pay | Admitting: Internal Medicine

## 2015-06-17 ENCOUNTER — Other Ambulatory Visit: Payer: Self-pay | Admitting: Internal Medicine

## 2015-06-21 ENCOUNTER — Other Ambulatory Visit: Payer: Self-pay | Admitting: Internal Medicine

## 2015-07-09 ENCOUNTER — Other Ambulatory Visit: Payer: Self-pay | Admitting: Internal Medicine

## 2015-07-13 ENCOUNTER — Other Ambulatory Visit: Payer: Self-pay | Admitting: Internal Medicine

## 2015-07-25 ENCOUNTER — Other Ambulatory Visit: Payer: Self-pay | Admitting: Internal Medicine

## 2015-09-04 ENCOUNTER — Telehealth: Payer: Self-pay | Admitting: Internal Medicine

## 2015-09-04 NOTE — Telephone Encounter (Signed)
APT. REMINDER CALL, LMTCB °

## 2015-09-05 ENCOUNTER — Encounter: Payer: Self-pay | Admitting: Internal Medicine

## 2015-09-05 ENCOUNTER — Ambulatory Visit (INDEPENDENT_AMBULATORY_CARE_PROVIDER_SITE_OTHER): Payer: Medicare Other | Admitting: Internal Medicine

## 2015-09-05 VITALS — BP 180/92 | HR 59 | Temp 98.3°F | Wt 180.1 lb

## 2015-09-05 DIAGNOSIS — M1180 Other specified crystal arthropathies, unspecified site: Secondary | ICD-10-CM | POA: Insufficient documentation

## 2015-09-05 DIAGNOSIS — E1100 Type 2 diabetes mellitus with hyperosmolarity without nonketotic hyperglycemic-hyperosmolar coma (NKHHC): Secondary | ICD-10-CM

## 2015-09-05 DIAGNOSIS — E1040 Type 1 diabetes mellitus with diabetic neuropathy, unspecified: Secondary | ICD-10-CM | POA: Diagnosis not present

## 2015-09-05 DIAGNOSIS — I10 Essential (primary) hypertension: Secondary | ICD-10-CM

## 2015-09-05 DIAGNOSIS — Z7984 Long term (current) use of oral hypoglycemic drugs: Secondary | ICD-10-CM

## 2015-09-05 DIAGNOSIS — Z79891 Long term (current) use of opiate analgesic: Secondary | ICD-10-CM

## 2015-09-05 DIAGNOSIS — M1712 Unilateral primary osteoarthritis, left knee: Secondary | ICD-10-CM | POA: Diagnosis not present

## 2015-09-05 LAB — POCT GLYCOSYLATED HEMOGLOBIN (HGB A1C): Hemoglobin A1C: 5.8

## 2015-09-05 LAB — GLUCOSE, CAPILLARY: GLUCOSE-CAPILLARY: 88 mg/dL (ref 65–99)

## 2015-09-05 MED ORDER — ACETAMINOPHEN 500 MG PO TABS: 1000.0000 mg | ORAL_TABLET | Freq: Four times a day (QID) | ORAL | Status: AC | PRN

## 2015-09-05 MED ORDER — IBUPROFEN 600 MG PO TABS: 600.0000 mg | ORAL_TABLET | Freq: Four times a day (QID) | ORAL | Status: DC | PRN

## 2015-09-05 MED ORDER — DICLOFENAC SODIUM 1 % TD GEL: TRANSDERMAL | Status: DC

## 2015-09-05 NOTE — Assessment & Plan Note (Addendum)
She is quite hypertensive today to 180/92. She is compliant with her home regimen of hydrochlorothiazide 12.5 mg daily, hydralazine 25 mg 3 times daily, losartan 25 mg daily, and atenolol 50 mg daily. I'd like to increase her hydrochlorothiazide and losartan, but she does not have a documented BMP in the last 2 years. Today, I will check a basic metabolic panel to ensure her renal function can handle the increased diuretics before I make any changes. If her renal function is stable, I will increase the thiazide to 25mg daily and have her follow-up before further increasing the losartan.  Addendum: Her EGFR has decreased to 37; because I've prescribed her NSAIDs for her knee pain, I've stopped her thiazide to prevent further damaging her kidneys, and I've increased her hydralazine from 25 to 50mg three times daily. I've called her daughter to let her know the change. She'll check her blood pressure the next few days to ensure she's not dropping below 110/50. 

## 2015-09-05 NOTE — Assessment & Plan Note (Signed)
She has bilateral knee osteoarthritis and is in chronic opiates. I don't think opiates are safe given her age and very high fall-risk, so I re-directed her therapy to oral and topical NSAIDs and scheduled steroid injections. Today I injected her left knee because this one hurts worse. I've also prescribed ibuprofen 600mg  every 6 hours as needed for pain for two weeks, in addition to acetaminophen 1000mg  every 6 hours indefinitiely, as well as topical Voltaren gel. At the next visit, we can offer her a steroid injection of her right knee and scheduled these every 3 months if they are helping her. I'd like to continue avoiding opiates as much as possible.

## 2015-09-05 NOTE — Assessment & Plan Note (Signed)
Her diabetes continues to be very well-controlled on metformin 500mg  twice daily so we'll continue to course. I've referred her for a retinal scan today.

## 2015-09-05 NOTE — Progress Notes (Signed)
Patient ID: Wanda HumphreysHazel L Mclaughlin, female   DOB: [birth year], 82 y.o.   MRN: [id number] [facility] Subjective:   Patient ID: Wanda HumphreysHazel L Mclaughlin female   DOB: [birth year] 82 y.o.   MRN: [id number]  HPI: Wanda Mclaughlin is a 82 y.o. female with essential hypertension, non-insulin dependent Type 2 diabetes, and bilateral knee osteoarthritis here for follow-up of hypertension, type 2 diabetes, and knee pain.  Hypertension: Her pressures at home have ranged from 140-160/80-100. She is compliant with all 3 of her antihypertensive medications. She denies any orthostatic hypotension.  Type 2 diabetes: She is compliant with metformin 500 mg twice daily, and denies any hypoglycemic symptoms.  Bilateral knee pain: She has had chronic bilateral knee pain for which she has taken Vicodin 7/325 3 times daily, but she ran out last month. She is requesting a refill of this medication. Tylenol helps slightly and ibuprofen helps a little more. She'd had knee injections in the past which have helped for a few months; her last injection was over a year ago.  She does not smoke and I reviewed her medications with her today.  Review of Systems  Constitutional: Negative for fever, chills, weight loss and malaise/fatigue.  Respiratory: Negative for shortness of breath and wheezing.   Cardiovascular: Positive for leg swelling. Negative for chest pain and orthopnea.  Musculoskeletal: Positive for joint pain. Negative for myalgias, back pain and falls.  Skin: Negative for rash.  Neurological: Negative for dizziness and loss of consciousness.    Objective:  Physical Exam: Filed Vitals:   09/05/15 1113 09/05/15 1221  BP: 194/79 180/92  Pulse: 59   Temp: 98.3 F (36.8 C)   TempSrc: Oral   Weight: 180 lb 1.6 oz (81.693 kg)   SpO2: 98%    General: friendly elderly black lady resting in chair comfortably, appropriately conversational HEENT: no scleral icterus, extra-ocular muscles intact, oropharynx  without lesions Cardiac: regular rate and rhythm, no rubs, murmurs or gallops Pulm: breathing well, clear to auscultation bilaterally Ext: warm and well perfused, with 1+ pedal edema to knees bilaterally knees without warmth nor effusion, no swollen joints on the hands Lymph: no cervical or supraclavicular lymphadenopathy Skin: no rash, hair, or nail changes  Left knee joint injection: The patient was apprised of the risks and the benefits of the procedure and consented. The patient's right knee was sterilely prepped with Betadine. Using a 5mL syringe, 1mL of Kenalog was mixed with 1mL of 1% lidocaine. The patient was injected with a 1.5-inch 27-gauge needle at the medial aspect of the left flexed knee. There were no complications. The patient tolerated the procedure well. There was minimal bleeding. The patient was instructed to ice his knee upon leaving clinic and refrain from overuse over the next 3 days. The patient was instructed to go to the emergency room with any usual pain, swelling, or redness occurred in the injected area. The patient was given a followup appointment to evaluate response to the injection to his increased range of motion and reduction of pain.  Assessment & Plan:  Case discussed with Dr. Josem KaufmannKlima  Essential hypertension She is quite hypertensive today to 180/92. She is compliant with her home regimen of hydrochlorothiazide 12.5 mg daily, hydralazine 25 mg 3 times daily, losartan 25 mg daily, and atenolol 50 mg daily. I'd like to increase her hydrochlorothiazide and losartan, but she does not have a documented BMP in the last 2 years. Today, I will check a basic metabolic panel to ensure  her renal function can handle the increased diuretics before I make any changes. If her renal function is stable, I will increase the thiazide to  daily and have her follow-up before further increasing the losartan.  Osteoarthritis of left knee She has bilateral knee osteoarthritis and is  in chronic opiates. I don't think opiates are safe given her age and very high fall-risk, so I re-directed her therapy to oral and topical NSAIDs and scheduled steroid injections. Today I injected her left knee because this one hurts worse. I've also prescribed ibuprofen  every 6 hours as needed for pain for two weeks, in addition to acetaminophen  every 6 hours indefinitiely, as well as topical Voltaren gel. At the next visit, we can offer her a steroid injection of her right knee and scheduled these every 3 months if they are helping her. I'd like to continue avoiding opiates as much as possible.  Diabetes mellitus with neuropathy Her diabetes continues to be very well-controlled on metformin  twice daily so we'll continue to course. I've referred her for a retinal scan today.   Medications Ordered Meds ordered this encounter  Medications  . ibuprofen (ADVIL,MOTRIN) 600 MG tablet    Sig: Take 1 tablet (600 mg total) by mouth every 6 (six) hours as needed.    Dispense:  60 tablet    Refill:  0  . acetaminophen (TYLENOL) 500 MG tablet    Sig: Take 2 tablets (1,000 mg total) by mouth every 6 (six) hours as needed.    Dispense:  100 tablet    Refill:  2  . diclofenac sodium (VOLTAREN) 1 % GEL    Sig: Apply to knees up to four times daily as needed for pain    Dispense:  1 Tube    Refill:  3   Other Orders Orders Placed This Encounter  Procedures  . Glucose, capillary  . BMP8+Anion Gap  . Ambulatory referral to Ophthalmology    Referral Priority:  Routine    Referral Type:  Consultation    Referral Reason:  Specialty Services Required    Requested Specialty:  Ophthalmology    Number of Visits Requested:  1  . POCT HgB A1C (CPT [id number])   Follow Up: Return in about 4 weeks (around 10/03/2015) for blood pressure re-check.

## 2015-09-06 LAB — BMP8+ANION GAP
ANION GAP: 18 mmol/L (ref 10.0–18.0)
BUN/Creatinine Ratio: 18 (ref 12–28)
BUN: 26 mg/dL (ref 8–27)
CALCIUM: 9.8 mg/dL (ref 8.7–10.3)
CO2: 22 mmol/L (ref 18–29)
Chloride: 100 mmol/L (ref 96–106)
Creatinine, Ser: 1.48 mg/dL — ABNORMAL HIGH (ref 0.57–1.00)
GFR calc Af Amer: 37 mL/min/{1.73_m2} — ABNORMAL LOW (ref >59)
GFR calc non Af Amer: 32 mL/min/{1.73_m2} — ABNORMAL LOW (ref >59)
GLUCOSE: 98 mg/dL (ref 65–99)
POTASSIUM: 4.1 mmol/L (ref 3.5–5.2)
SODIUM: 140 mmol/L (ref 134–144)

## 2015-09-06 MED ORDER — HYDRALAZINE HCL 50 MG PO TABS: 50.0000 mg | ORAL_TABLET | Freq: Three times a day (TID) | ORAL | Status: DC

## 2015-09-06 NOTE — Addendum Note (Signed)
Addended by: Cicero DuckFLORES, Mackena Plummer H on: 09/06/2015 08:19 AM   Modules accepted: Orders, Medications, SmartSet

## 2015-09-07 NOTE — Progress Notes (Signed)
I saw and evaluated the patient.  I personally confirmed the key portions of Dr. Earnest ConroyFlores' history and exam and reviewed pertinent patient test results.  The assessment, diagnosis, and plan were formulated together and I agree with the documentation in the resident's note.  I was present at the bedside for the entire knee injection.  The patient tolerated the procedure well.

## 2015-10-11 DIAGNOSIS — E119 Type 2 diabetes mellitus without complications: Secondary | ICD-10-CM | POA: Diagnosis not present

## 2015-11-01 LAB — HM DIABETES EYE EXAM

## 2015-11-13 ENCOUNTER — Encounter: Payer: Self-pay | Admitting: *Deleted

## 2015-11-26 ENCOUNTER — Other Ambulatory Visit: Payer: Self-pay | Admitting: Internal Medicine

## 2015-12-25 ENCOUNTER — Telehealth: Payer: Self-pay | Admitting: Internal Medicine

## 2015-12-25 NOTE — Telephone Encounter (Signed)
APT. REMINDER CALL, LMTCB °

## 2015-12-26 ENCOUNTER — Encounter: Payer: Self-pay | Admitting: Internal Medicine

## 2015-12-26 ENCOUNTER — Ambulatory Visit (INDEPENDENT_AMBULATORY_CARE_PROVIDER_SITE_OTHER): Payer: Medicare Other | Admitting: Internal Medicine

## 2015-12-26 VITALS — BP 163/63 | HR 60 | Temp 98.7°F | Ht 63.0 in | Wt 172.8 lb

## 2015-12-26 DIAGNOSIS — Z23 Encounter for immunization: Secondary | ICD-10-CM

## 2015-12-26 DIAGNOSIS — I10 Essential (primary) hypertension: Secondary | ICD-10-CM

## 2015-12-26 DIAGNOSIS — E1140 Type 2 diabetes mellitus with diabetic neuropathy, unspecified: Secondary | ICD-10-CM

## 2015-12-26 MED ORDER — AMLODIPINE-ATORVASTATIN 5-10 MG PO TABS: 1.0000 | ORAL_TABLET | Freq: Every day | ORAL | 0 refills | Status: DC

## 2015-12-26 MED ORDER — OLMESARTAN MEDOXOMIL-HCTZ 20-12.5 MG PO TABS
1.0000 | ORAL_TABLET | Freq: Every day | ORAL | 0 refills | Status: DC
Start: 2015-12-26 — End: 2016-03-20

## 2015-12-26 NOTE — Progress Notes (Signed)
   CC: HTN and diabetes HPI: Ms.Debe L Arvilla MarketMills is a 82 y.o. woman with PMH noted below here for hypertension and diabetes  Please see Problem List/A&P for the status of the patient's chronic medical problems   Past Medical History:  Diagnosis Date  . Anemia    Normal EGG, colonoscopy, and Sm. Bowel F/T 2006  . Chronic knee pain   . Chronic renal failure   . Degenerative joint disease   . Diabetes mellitus 11/05   type II  . Diabetic peripheral neuropathy (HCC)   . Duodenitis   . Gastritis   . Health maintenance examination    Mammogram 10/11: Possible mass, right breast. No evidence of malignancy in left breast. Needle biopsy of left breast 12/11: No evidence of  carcinoma. Patient needs follow-up right breast diagnostic mammogram with ultrasound in 6 months.  . Hyperlipidemia   . Hypertension   . Osteoporosis 09/2009   T score -2.8  . Vertigo     Review of Systems: Denies fevers, chills, fatigue Denies cough, SOB Denies n.v Denies falls  Physical Exam: Vitals:   12/26/15 0907 12/26/15 0944  BP: (!) 171/69 (!) 163/63  Pulse: 73 60  Temp: 98.7 F (37.1 C)   TempSrc: Oral   SpO2: 99%   Weight: 172 lb 12.8 oz (78.4 kg)   Height: 5\' 3"  (1.6 m)     General: A&O, in NAD CV: RRR, normal s1, s2, no m/r/g,  Resp: equal and symmetric breath sounds, no wheezing or crackles  Abdomen: soft, nontender, nondistended, +BS   Assessment & Plan:   See encounters tab for problem based medical decision making. Patient discussed with Dr. Josem KaufmannKlima

## 2015-12-26 NOTE — Assessment & Plan Note (Signed)
Pt's diabetes has been well controlled over the past few years and A1c has been around 5.8 to 6. She is currently on metformin 500 mg BID.  Plan -Goal A1c <8 -asked pt to stop metformin.  -We will recheck her a1c in 3 months, if it is elevated then we can restart but, I think a trial of stopping metformin may be worth a try

## 2015-12-26 NOTE — Assessment & Plan Note (Addendum)
BP Readings from Last 3 Encounters:  12/26/15 (!) 163/63  09/05/15 (!) 180/92  02/03/15 (!) 205/83   Patient's repeat blood pressure was 163/63. She currently takes hydralazine 50 mg TID, losartan 25 mg daily, atenolol 50 mg daily. She was also on hctz at one point but was stopped. She was interested in reducing the pill burden. Goal blood pressure for her is <150 Discussed with Dr. Selena BattenKim.  Plan -start amlodipine 5-atorvastatin 10 (Caduet) daily -start olmesartan-hctz 20-12.5 mg (Benicar) daily -Asked pt to stop losartan and hydralazine. Asked her to take half a tab of atenolol for 3 days and then stop -Pt will follow up in 3 months. -I asked the pt to check her blood pressure in [facility] and let us know if it is over 160. -ordered BMET

## 2015-12-26 NOTE — Progress Notes (Signed)
Case discussed with Dr. Saraiya at the time of the visit. We reviewed the resident's history and exam and pertinent patient test results. I agree with the assessment, diagnosis, and plan of care documented in the resident's note. 

## 2015-12-26 NOTE — Patient Instructions (Addendum)
Thank you for your visit today  Please stop the hydralazine, and losartan. Please take half of the atenolol pill for 3 days and then stop.   Please start taking the two new blood pressure medicines that we gave you today.  Please stop the metformin  Please follow up in 3 months

## 2016-01-10 ENCOUNTER — Other Ambulatory Visit: Payer: Self-pay | Admitting: Internal Medicine

## 2016-01-13 ENCOUNTER — Other Ambulatory Visit: Payer: Self-pay | Admitting: Internal Medicine

## 2016-01-19 ENCOUNTER — Other Ambulatory Visit: Payer: Self-pay | Admitting: Internal Medicine

## 2016-02-05 ENCOUNTER — Telehealth: Payer: Self-pay | Admitting: Internal Medicine

## 2016-02-05 DIAGNOSIS — H9193 Unspecified hearing loss, bilateral: Secondary | ICD-10-CM

## 2016-02-05 NOTE — Telephone Encounter (Signed)
Pt's daughter requesting for Mother to get [facility]. Fixed.  [facility]. Pt will have to be seen 1st in order for her to get her old Hearing aid fixed, because it has been years since they have seen her.  Please Advise.

## 2016-02-06 NOTE — Telephone Encounter (Signed)
Wanda Mclaughlin has significant hearing loss and definitely needs an appropriately configured aid. I have previously documented her difficulty hearing despite wearing an aid in one ear during clinic encounters.  I placed a new order [facility]. Let me know if there is anything else needed or if she has other questions for me. Thanks.

## 2016-02-22 ENCOUNTER — Other Ambulatory Visit: Payer: Self-pay | Admitting: *Deleted

## 2016-02-22 MED ORDER — GLUCOSE BLOOD VI STRP: ORAL_STRIP | 0 refills | Status: DC

## 2016-02-22 NOTE — Telephone Encounter (Signed)
It looks like Dr. Johnny BridgeSaraiya appropriately stopped the metformin at the last visit and the plan was to repeat the Hgb A1C off oral hypoglycemics.  She is scheduled to see her PCP in early December.  I would advise her to check her blood sugars once every other day. If her Hgb A1C is < 7.0 in December she can stop taking her blood sugars and ask Dr. Dimple Caseyice to follow periodic Hgb A1Cs.  He will also have to then determine what the target Hgb A1C is for his 82 year old patient.  Please call her with the answer to her question.  Thanks.

## 2016-02-22 NOTE — Telephone Encounter (Signed)
Requesting 90 day supply.  Also how often should she be testing her blood sugars? Thanks

## 2016-02-23 NOTE — Telephone Encounter (Signed)
Pt called - no answer; left message to check blood sugars every other day until she sees her doctor in December. And call for any questions.

## 2016-03-04 ENCOUNTER — Other Ambulatory Visit: Payer: Self-pay | Admitting: Internal Medicine

## 2016-03-06 NOTE — Telephone Encounter (Signed)
appt scheduled for 12/01 with pcp.Criss AlvineGoldston, Caswell Alvillar Cassady11/8/20179:29 AM

## 2016-03-12 DIAGNOSIS — H9113 Presbycusis, bilateral: Secondary | ICD-10-CM | POA: Insufficient documentation

## 2016-03-20 ENCOUNTER — Other Ambulatory Visit: Payer: Self-pay | Admitting: Internal Medicine

## 2016-03-20 DIAGNOSIS — I10 Essential (primary) hypertension: Secondary | ICD-10-CM

## 2016-03-24 ENCOUNTER — Other Ambulatory Visit: Payer: Self-pay | Admitting: Internal Medicine

## 2016-03-24 DIAGNOSIS — I10 Essential (primary) hypertension: Secondary | ICD-10-CM

## 2016-03-29 ENCOUNTER — Other Ambulatory Visit: Payer: Self-pay

## 2016-03-29 ENCOUNTER — Other Ambulatory Visit: Payer: Self-pay | Admitting: Internal Medicine

## 2016-03-29 ENCOUNTER — Ambulatory Visit (INDEPENDENT_AMBULATORY_CARE_PROVIDER_SITE_OTHER): Payer: Medicare Other | Admitting: Internal Medicine

## 2016-03-29 ENCOUNTER — Encounter: Payer: Self-pay | Admitting: Internal Medicine

## 2016-03-29 VITALS — BP 150/80 | HR 84 | Temp 98.5°F | Ht 63.0 in | Wt 175.6 lb

## 2016-03-29 DIAGNOSIS — Z79891 Long term (current) use of opiate analgesic: Secondary | ICD-10-CM

## 2016-03-29 DIAGNOSIS — N183 Chronic kidney disease, stage 3 unspecified: Secondary | ICD-10-CM | POA: Insufficient documentation

## 2016-03-29 DIAGNOSIS — E1140 Type 2 diabetes mellitus with diabetic neuropathy, unspecified: Secondary | ICD-10-CM

## 2016-03-29 DIAGNOSIS — E1122 Type 2 diabetes mellitus with diabetic chronic kidney disease: Secondary | ICD-10-CM

## 2016-03-29 DIAGNOSIS — N1832 Chronic kidney disease, stage 3b: Secondary | ICD-10-CM | POA: Insufficient documentation

## 2016-03-29 DIAGNOSIS — N189 Chronic kidney disease, unspecified: Secondary | ICD-10-CM

## 2016-03-29 DIAGNOSIS — I10 Essential (primary) hypertension: Secondary | ICD-10-CM

## 2016-03-29 DIAGNOSIS — Z79899 Other long term (current) drug therapy: Secondary | ICD-10-CM

## 2016-03-29 DIAGNOSIS — I129 Hypertensive chronic kidney disease with stage 1 through stage 4 chronic kidney disease, or unspecified chronic kidney disease: Secondary | ICD-10-CM

## 2016-03-29 DIAGNOSIS — M1712 Unilateral primary osteoarthritis, left knee: Secondary | ICD-10-CM

## 2016-03-29 LAB — POCT GLYCOSYLATED HEMOGLOBIN (HGB A1C): Hemoglobin A1C: 6.8

## 2016-03-29 LAB — GLUCOSE, CAPILLARY: Glucose-Capillary: 112 mg/dL — ABNORMAL HIGH (ref 65–99)

## 2016-03-29 NOTE — Progress Notes (Signed)
   CC: Follow up for hypertension  HPI:  [name] is a 82 y.o. woman with past medical history detailed below presenting for follow up of her hypertension and diabetes today. She was seem several months ago with medication changes to consolidate her daily pill burden and hopefully improve blood pressure control. She was also recommended to stop metformin daily and be reassessed today for Hgb A1c. She was taking his blood pressure medicines up until about 3 days ago. She has not noticed any problems with dizziness, lightheadedness, or falls. She is not suffering any new shortness of breath or headaches. Over the interval she has been feeling somewhat poorly due to bilateral arm pain ongoing for about 1 month. She describes this as a shooting pain sometimes with tingling in her hands. She notices this most when spending a long time with arms in a dependent position. It does not particularly bother her at night or interfere with sleep. Her chronic osteoarthritis knee pain is also bothering her particularly in the left knee during this time.   See problem based assessment and plan below for additional details.  Past Medical History:  Diagnosis Date  . Anemia    Normal EGG, colonoscopy, and Sm. Bowel F/T 2006  . Chronic knee pain   . Chronic renal failure   . Degenerative joint disease   . Diabetes mellitus 11/05   type II  . Diabetic peripheral neuropathy (HCC)   . Duodenitis   . Gastritis   . Health maintenance examination    Mammogram 10/11: Possible mass, right breast. No evidence of malignancy in left breast. Needle biopsy of left breast 12/11: No evidence of  carcinoma. Patient needs follow-up right breast diagnostic mammogram with ultrasound in 6 months.  . Hyperlipidemia   . Hypertension   . Osteoporosis 09/2009   T score -2.8  . Vertigo     Review of Systems:  Review of Systems  Constitutional: Negative for malaise/fatigue.  HENT: Positive for hearing loss.   Eyes:  Negative for blurred vision.  Respiratory: Negative for shortness of breath.   Cardiovascular: Negative for chest pain and leg swelling.  Musculoskeletal: Positive for joint pain and neck pain. Negative for falls.  Neurological: Negative for tremors, focal weakness and headaches.     Physical Exam:  Vitals:   03/29/16 1327 03/29/16 1356  BP: (!) 184/92 (!) 150/80  Pulse: 84   Temp: 98.5 F (36.9 C)   TempSrc: Oral   SpO2: 100%   Weight: 175 lb 9.6 oz (79.7 kg)   Height: 5\' 3"  (1.6 m)    GENERAL- alert, co-operative, NAD HEENT- Atraumatic, oral mucosa appears moist CARDIAC- RRR, no murmurs RESP- CTAB, no wheezes or crackles BACK- No paraspinal tenderness, tender to palpation along supraspinatus and trapezius all the way to shoulders b/l NEURO- Strength upper and lower extremities- 5/5, Sensation intact globally EXTREMITIES- Minimal pain on abduction against resistance, negative Neers and Hawkins maneuvers, left knee with crepitus on range of motion without obvious effusion SKIN- Warm, dry, No rash or lesion PSYCH- Normal mood and affect, appropriate thought content and speech    Assessment & Plan:   See Encounters Tab for problem based charting.  Patient discussed with Dr. Oswaldo DoneVincent

## 2016-03-29 NOTE — Patient Instructions (Signed)
It was a pleasure to see you today.  Your blood sugars are fine today and you do not need to resume taking the metformin.  I recommend restarting the gabapentin as a daily medicine up to 3 times as needed for the pain in your hands. If this does not improve the symptoms we may need to consider nerve impingement in the future.  I would like to see her blood pressure on the medications so I have ordered these refilled. Please try to return to the clinic soon while on these medicines so we can make a better plan.  For your knee pain I recommend arranging a follow-up appointment sometime in the next 1-2 weeks for a joint injection. We can also recheck blood pressure at that visit.

## 2016-04-01 NOTE — Assessment & Plan Note (Addendum)
Hgb A1c is 6.8% today off all medications. This is controlled so at this time I will not recommend resuming any medications.  Her complaint about bilateral hand pain or numbness sound consistent with neuropathy versus impingement. Physical exam was not very impressive for obvious signs of impingement at the neck or shoulders or any functional deficit. I recommended she resume taking her gabapentin if this bothers her.

## 2016-04-01 NOTE — Assessment & Plan Note (Addendum)
She had been on chronic opiates for pain until this past year and is reporting a fair amount of pain due to arthritis in her R>L knees. I would prefer to avoid chronic heavy NSAID use given her CKD but also agree with the previous plan to avoid opiates where possible in this 82 year old woman. She has had benefit from topical NSAIDs and from intraarticular injections in the past. I recommended she return to the clinic in the next 1-2 weeks if this remains a problem for an injection of the right knee.

## 2016-04-01 NOTE — Assessment & Plan Note (Signed)
BP Readings from Last 3 Encounters:  03/29/16 (!) 150/80  12/26/15 (!) 163/63  09/05/15 (!) 180/92    Lab Results  Component Value Date   NA 140 09/05/2015   K 4.1 09/05/2015   CREATININE 1.48 (H) 09/05/2015    Assessment: Blood pressure control: Moderate uncontrolled hypertension Progress toward BP goal:  Slight Comments: Wanda Mclaughlin reports not taking her antihypertensives for 2 or 3 days prior to this clinic visit. Based on that it is difficult to make any insight into appropriate adjustments at this time. She is certainly not in a hypertensive emergency at this time and is also not reporting any symptoms concerning for hypotension.  Plan: Medications:  Resume amlodipine 5mg , olmesartan-hctz 20-12.5mg  Other plans: I have recommended she return to the clinic in the next week or two after restarting her medicines. Checking her blood pressure at that time would be more useful.

## 2016-04-01 NOTE — Progress Notes (Signed)
Internal Medicine Clinic Attending  Case discussed with Dr. Rice at the time of the visit.  We reviewed the resident's history and exam and pertinent patient test results.  I agree with the assessment, diagnosis, and plan of care documented in the resident's note.  

## 2016-04-05 ENCOUNTER — Encounter: Payer: Self-pay | Admitting: Student in an Organized Health Care Education/Training Program

## 2016-04-05 ENCOUNTER — Ambulatory Visit (INDEPENDENT_AMBULATORY_CARE_PROVIDER_SITE_OTHER): Payer: Medicare Other | Admitting: Internal Medicine

## 2016-04-05 ENCOUNTER — Encounter: Payer: Self-pay | Admitting: Internal Medicine

## 2016-04-05 VITALS — BP 165/82 | HR 80 | Temp 98.2°F | Wt 176.6 lb

## 2016-04-05 DIAGNOSIS — Z79899 Other long term (current) drug therapy: Secondary | ICD-10-CM | POA: Diagnosis not present

## 2016-04-05 DIAGNOSIS — M1712 Unilateral primary osteoarthritis, left knee: Secondary | ICD-10-CM | POA: Diagnosis not present

## 2016-04-05 DIAGNOSIS — I10 Essential (primary) hypertension: Secondary | ICD-10-CM

## 2016-04-05 DIAGNOSIS — M25462 Effusion, left knee: Secondary | ICD-10-CM

## 2016-04-05 LAB — SYNOVIAL CELL COUNT + DIFF, W/ CRYSTALS
EOSINOPHILS-SYNOVIAL: 0 % (ref 0–1)
LYMPHOCYTES-SYNOVIAL FLD: 38 % — AB (ref 0–20)
MONOCYTE-MACROPHAGE-SYNOVIAL FLUID: 60 % (ref 50–90)
Neutrophil, Synovial: 2 % (ref 0–25)
WBC, SYNOVIAL: 715 /mm3 — AB (ref 0–200)

## 2016-04-05 MED ORDER — AMLODIPINE-ATORVASTATIN 10-10 MG PO TABS: 1.0000 | ORAL_TABLET | Freq: Every day | ORAL | 11 refills | Status: DC

## 2016-04-05 NOTE — Progress Notes (Signed)
Internal Medicine Clinic Attending  I saw and evaluated the patient.  I personally confirmed the key portions of the history and exam documented by Dr. Danella Pentonruong and I reviewed pertinent patient test results.  The assessment, diagnosis, and plan were formulated together and I agree with the documentation in the resident's note.   Knee arthrocentesis for symptomatic treatment of osteoarthritis. We used point of care ultrasound to identify a moderate knee effusion in the suprapatellar position (first image below). We aspirated about 2 ml of yellow synovial fluid. Triamcinolone was instilled into the same joint space. After the procedure we used the ultrasound to confirm the steroid crystals in the joint space (second image below).

## 2016-04-05 NOTE — Assessment & Plan Note (Addendum)
Assessment: Blood pressure 165/82 today. She is on Cadeut 5-10mg  and benicar HCT 20-12.5 qd. She is complaint with meds and took her meds this morning.   Plan: increased cadeut to 10-10mg . F/u in 3 months for BP check.

## 2016-04-05 NOTE — Assessment & Plan Note (Addendum)
Knee Arthrocentesis with Injection Procedure Note  Diagnosis: left knee OA  Indications: Symptomatic relief of large effusion  Anesthesia: none  Procedure Details   Point of care ultrasound was used to identify the joint effusion and plan needle trajectory and depth. Consent was obtained for the procedure. The joint was prepped with Betadine. A 22 gauge needle was inserted into the superior aspect of the joint from a medial approach to access the suprapatellar pouch. 1 ml of clear yellow fluid was removed from the joint and sent to the lab for analysis. 2 ml 1% lidocaine and 40mg  of Triamcinolone was then injected into the joint through the same needle. The needle was removed and the area cleansed and dressed.  Complications:  None; patient tolerated the procedure well.

## 2016-04-05 NOTE — Patient Instructions (Addendum)
Start taking Caduet 10-10mg  once a day.    Knee Injection, Care After Refer to this sheet in the next few weeks. These instructions provide you with information about caring for yourself after your procedure. Your health care provider may also give you more specific instructions. Your treatment has been planned according to current medical practices, but problems sometimes occur. Call your health care provider if you have any problems or questions after your procedure. What can I expect after the procedure? After the procedure, it is common to have:  Soreness.  Warmth.  Swelling. You may have more pain, swelling, and warmth than you did before the injection. This reaction may last for about one day. Follow these instructions at home: Bathing  If you were given a bandage (dressing), keep it dry until your health care provider says it can be removed. Ask your health care provider when you can start showering or taking a bath. Managing pain, stiffness, and swelling  If directed, apply ice to the injection area:  Put ice in a plastic bag.  Place a towel between your skin and the bag.  Leave the ice on for 20 minutes, 2-3 times per day.  Do not apply heat to your knee.  Raise the injection area above the level of your heart while you are sitting or lying down. Activity  Avoid strenuous activities for as long as directed by your health care provider. Ask your health care provider when you can return to your normal activities. General instructions  Take medicines only as directed by your health care provider.  Do not take aspirin or other over-the-counter medicines unless your health care provider says you can.  Check your injection site every day for signs of infection. Watch for:  Redness, swelling, or pain.  Fluid, blood, or pus.  Follow your health care provider's instructions about dressing changes and removal. Contact a health care provider if:  You have symptoms at  your injection site that last longer than two days after your procedure.  You have redness, swelling, or pain in your injection area.  You have fluid, blood, or pus coming from your injection site.  You have warmth in your injection area.  You have a fever.  Your pain is not controlled with medicine. Get help right away if:  Your knee turns very red.  Your knee becomes very swollen.  Your knee pain is severe. This information is not intended to replace advice given to you by your health care provider. Make sure you discuss any questions you have with your health care provider. Document Released: 05/06/2014 Document Revised: 12/20/2015 Document Reviewed: 02/23/2014 Elsevier Interactive Patient Education  2017 [employer]

## 2016-04-05 NOTE — Progress Notes (Signed)
   CC: Left knee injection and blood pressure follow-up  HPI:  Wanda Mclaughlin is a 82 y.o. with past medical history as outlined below who presents to clinic for left knee injection and blood pressure follow-up. Please see problem list for patient's chronic medical issues.  Past Medical History:  Diagnosis Date  . Anemia    Normal EGG, colonoscopy, and Sm. Bowel F/T 2006  . Chronic knee pain   . Chronic renal failure   . Degenerative joint disease   . Diabetes mellitus 11/05   type II  . Diabetic peripheral neuropathy (HCC)   . Duodenitis   . Gastritis   . Health maintenance examination    Mammogram 10/11: Possible mass, right breast. No evidence of malignancy in left breast. Needle biopsy of left breast 12/11: No evidence of  carcinoma. Patient needs follow-up right breast diagnostic mammogram with ultrasound in 6 months.  . Hyperlipidemia   . Hypertension   . Osteoporosis 09/2009   T score -2.8  . Vertigo     Review of Systems:  Positive for left knee pain. Denies vertigo and dizziness upon standing.  Physical Exam:  Vitals:   04/05/16 1004  BP: (!) 165/82  Pulse: 80   General: NAD,Obese  Cardiac: RRR, no murmurs GI: soft, active bowel sounds Neuro: CN II-XII grossly intact Musculoskeletal: Left knee nontender to palpation, neg for erythema   Assessment & Plan:   See Encounters Tab for problem based charting.  Patient seen with Dr. Oswaldo DoneVincent

## 2016-04-16 ENCOUNTER — Other Ambulatory Visit: Payer: Self-pay | Admitting: Internal Medicine

## 2016-04-16 DIAGNOSIS — I10 Essential (primary) hypertension: Secondary | ICD-10-CM

## 2016-04-16 NOTE — Telephone Encounter (Signed)
Received refill request from pt's pharmacy for hydralazine 50mg  tabs. Attempted to contact pt to see if she is still taking as this rx was d/c during a previous office visit and no longer on pt's medication list-no answer, message left on recorder.  Will deny rx at this time and send to pcp for cosign.Kingsley SpittleGoldston, Darlene Cassady12/19/201710:49 AM

## 2016-04-18 ENCOUNTER — Other Ambulatory Visit: Payer: Self-pay | Admitting: *Deleted

## 2016-04-18 NOTE — Telephone Encounter (Signed)
Agree with discontinuation of hydralazine. [facility] with [facility] pressure treatment plan changed from earlier this year.

## 2016-04-19 MED ORDER — METOCLOPRAMIDE HCL 5 MG PO TABS: ORAL_TABLET | ORAL | 3 refills | Status: DC

## 2016-05-16 ENCOUNTER — Other Ambulatory Visit: Payer: Self-pay | Admitting: Internal Medicine

## 2016-06-18 ENCOUNTER — Other Ambulatory Visit: Payer: Self-pay | Admitting: Internal Medicine

## 2016-06-18 DIAGNOSIS — I10 Essential (primary) hypertension: Secondary | ICD-10-CM

## 2016-07-04 ENCOUNTER — Telehealth: Payer: Self-pay | Admitting: Internal Medicine

## 2016-07-04 NOTE — Telephone Encounter (Signed)
APT. REMINDER CALL, LMTCB °

## 2016-07-05 ENCOUNTER — Encounter: Payer: Self-pay | Admitting: Internal Medicine

## 2016-07-05 ENCOUNTER — Ambulatory Visit (INDEPENDENT_AMBULATORY_CARE_PROVIDER_SITE_OTHER): Payer: Medicare Other | Admitting: Internal Medicine

## 2016-07-05 VITALS — BP 137/69 | HR 82 | Temp 99.2°F | Ht 63.0 in | Wt 184.0 lb

## 2016-07-05 DIAGNOSIS — E1122 Type 2 diabetes mellitus with diabetic chronic kidney disease: Secondary | ICD-10-CM

## 2016-07-05 DIAGNOSIS — Z79899 Other long term (current) drug therapy: Secondary | ICD-10-CM | POA: Diagnosis not present

## 2016-07-05 DIAGNOSIS — G8929 Other chronic pain: Secondary | ICD-10-CM | POA: Diagnosis not present

## 2016-07-05 DIAGNOSIS — M79642 Pain in left hand: Secondary | ICD-10-CM

## 2016-07-05 DIAGNOSIS — I10 Essential (primary) hypertension: Secondary | ICD-10-CM

## 2016-07-05 DIAGNOSIS — E1140 Type 2 diabetes mellitus with diabetic neuropathy, unspecified: Secondary | ICD-10-CM | POA: Diagnosis not present

## 2016-07-05 DIAGNOSIS — N183 Chronic kidney disease, stage 3 unspecified: Secondary | ICD-10-CM

## 2016-07-05 DIAGNOSIS — M11862 Other specified crystal arthropathies, left knee: Secondary | ICD-10-CM | POA: Diagnosis not present

## 2016-07-05 DIAGNOSIS — M11861 Other specified crystal arthropathies, right knee: Secondary | ICD-10-CM | POA: Diagnosis not present

## 2016-07-05 DIAGNOSIS — I129 Hypertensive chronic kidney disease with stage 1 through stage 4 chronic kidney disease, or unspecified chronic kidney disease: Secondary | ICD-10-CM

## 2016-07-05 DIAGNOSIS — M79641 Pain in right hand: Secondary | ICD-10-CM

## 2016-07-05 DIAGNOSIS — M1180 Other specified crystal arthropathies, unspecified site: Secondary | ICD-10-CM

## 2016-07-05 LAB — GLUCOSE, CAPILLARY: GLUCOSE-CAPILLARY: 145 mg/dL — AB (ref 65–99)

## 2016-07-05 LAB — POCT GLYCOSYLATED HEMOGLOBIN (HGB A1C): Hemoglobin A1C: 6.4

## 2016-07-05 MED ORDER — VOLTAREN 1 % TD GEL: 4.0000 g | Freq: Four times a day (QID) | TRANSDERMAL | 3 refills | Status: DC

## 2016-07-05 NOTE — Patient Instructions (Signed)
It was a pleasure to see you today Wanda Mclaughlin.  You are doing a fantastic job controlling your hypertension and diabetes with good eating and taking your medications. I do not think we need to change anything about this treatment today. You can also stop checking your blood glucose at home since it is very stable.  I think your finger pain and numbness may be related to neuropathy in which case the gabapentin should be a helpful medication for this problem. If it becomes worse or you start to notice problems with tasks or weakness please let us know since we could consider checking for other problems.  We are checking your blood test today since your blood pressure medications were adjusted since the last check in May 2017.

## 2016-07-05 NOTE — Progress Notes (Signed)
   CC: Chronic shoulder and knee pain  HPI:  [name] is a 83 y.o. woman here for follow up her chronic joint pains, diabetes, and needing medication refills.   See problem based assessment and plan below for additional details  Past Medical History:  Diagnosis Date  . Anemia    Normal EGG, colonoscopy, and Sm. Bowel F/T 2006  . Chronic knee pain   . Chronic renal failure   . Degenerative joint disease   . Diabetes mellitus 11/05   type II  . Diabetic peripheral neuropathy (HCC)   . Duodenitis   . Gastritis   . Health maintenance examination    Mammogram 10/11: Possible mass, right breast. No evidence of malignancy in left breast. Needle biopsy of left breast 12/11: No evidence of  carcinoma. Patient needs follow-up right breast diagnostic mammogram with ultrasound in 6 months.  . Hyperlipidemia   . Hypertension   . Osteoporosis 09/2009   T score -2.8  . Vertigo     Review of Systems:  Review of Systems  Respiratory: Negative for shortness of breath.   Cardiovascular: Negative for leg swelling.  Musculoskeletal: Positive for back pain and joint pain. Negative for falls.  Skin: Negative for rash.  Neurological: Positive for tingling. Negative for focal weakness and weakness.  Endo/Heme/Allergies: Does not bruise/bleed easily.    Physical Exam: Physical Exam  Constitutional: She is well-developed, well-nourished, and in no distress.  Neck: Normal range of motion.  Cardiovascular: Normal rate and regular rhythm.   Pulmonary/Chest: Effort normal and breath sounds normal.  Musculoskeletal:  Normal strength and range of motion intact in bilateral upper extremities. She denies worsened discomfort with abducting arms to vertical position. Mild parasthesias reported in all  Skin: No rash noted.  Psychiatric: Affect normal.    Vitals:   07/05/16 1329  BP: 137/69  Pulse: 82  Temp: 99.2 F (37.3 C)  TempSrc: Oral  SpO2: 100%  Weight: 184 lb (83.5 kg)  Height:  5\' 3"  (1.6 m)     Assessment & Plan:   See Encounters Tab for problem based charting.  Patient discussed with Dr. Heide SparkNarendra

## 2016-07-06 LAB — BMP8+ANION GAP
Anion Gap: 18 mmol/L (ref 10.0–18.0)
BUN / CREAT RATIO: 18 (ref 12–28)
BUN: 27 mg/dL (ref 8–27)
CO2: 22 mmol/L (ref 18–29)
CREATININE: 1.47 mg/dL — AB (ref 0.57–1.00)
Calcium: 9.2 mg/dL (ref 8.7–10.3)
Chloride: 97 mmol/L (ref 96–106)
GFR calc Af Amer: 37 mL/min/{1.73_m2} — ABNORMAL LOW (ref >59)
GFR, EST NON AFRICAN AMERICAN: 32 mL/min/{1.73_m2} — AB (ref >59)
Glucose: 127 mg/dL — ABNORMAL HIGH (ref 65–99)
Potassium: 3.8 mmol/L (ref 3.5–5.2)
SODIUM: 137 mmol/L (ref 134–144)

## 2016-07-09 ENCOUNTER — Telehealth: Payer: Self-pay | Admitting: *Deleted

## 2016-07-09 DIAGNOSIS — M79641 Pain in right hand: Secondary | ICD-10-CM

## 2016-07-09 DIAGNOSIS — M79642 Pain in left hand: Secondary | ICD-10-CM

## 2016-07-09 HISTORY — DX: Pain in left hand: M79.642

## 2016-07-09 HISTORY — DX: Pain in right hand: M79.641

## 2016-07-09 NOTE — Assessment & Plan Note (Signed)
HPI: She is checking blood sugars at home despite no longer taking any medications for this. She is not having any complaints of likely hyperglycemic symptoms. Hgb A1c is <6% on no therapy.  A: Diet controlled diabetes with an A1c goal of <8.0% She has been off all treatments  P: Discontinue home blood glucose testing Encouraged continued diet only control

## 2016-07-09 NOTE — Progress Notes (Signed)
Internal Medicine Clinic Attending  Case discussed with Dr. Rice at the time of the visit.  We reviewed the resident's history and exam and pertinent patient test results.  I agree with the assessment, diagnosis, and plan of care documented in the resident's note.  

## 2016-07-09 NOTE — Assessment & Plan Note (Signed)
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  A: No recurrent pseudogout episodes since steroid injection 3 months ago  P: No new interventions at this time. Continue to limit chronic NSAID doses for her CKD

## 2016-07-09 NOTE — Assessment & Plan Note (Addendum)
HPI: She has chronic pain in both hands that is slightly increased now. It sometimes radiates up to her shoulders but this is not provoked by any particular movement or activity. She also describes some partial numbness and tingling sensation in her fingertips that is intermittent. There is no associated swelling or weakness in her hands.  A: Slightly worsened chronic bilateral hand pain This seems partially consistent with an impingement distribution of pain, but nothing is provoked by shoulder examination or movement. Carpal tunnel syndrome is possible although radiation up her arms is atypical of this. Peripheral diabetic neuropathy is possible although she has no symptoms in her feet and her diabetes has been very well controlled for years. Currently she has no alarming symptoms of weakness in her hands or significantly progressive symptoms.  P: Recommended she resume her home gabapentin for suspected neuropathic pain If pain does not improve or parasthesias worsen would recommend evaluation for carpal tunnel syndrome using trial of wrist splints and possibly future nerve conduction study

## 2016-07-09 NOTE — Telephone Encounter (Addendum)
Information was sent to Cover[employer]eds for PA for Diclofenac Gel 1%.  Awaiting answer within 72 hours.  Angelina OkGladys Emmylou Bieker, RN 07/09/2016 4:43 PM Prior Authorization was approved  for Diclofenac Gel 07/09/2016 through 04/28/2017.  Angelina OkGladys Farhiya Rosten, RN 07/10/2016 8:44 AM.

## 2016-07-09 NOTE — Assessment & Plan Note (Signed)
HPI: No recent changes to blood pressure medication and no symptoms of headache, leg swelling, or orthostatic hypotension. Blood pressure 137/69 today.  A: Well controlled hypertension with goal <140/90  P: Continue amlodipine 5mg , olmesartan-HCTZ 20mg -12.5mg 

## 2016-07-09 NOTE — Assessment & Plan Note (Signed)
A: Longstanding CKD stage 3 most likely from her longstanding hypertension.  P: Checking metabolic panel today for monitoring over 1 year since checked

## 2016-09-14 ENCOUNTER — Other Ambulatory Visit: Payer: Self-pay | Admitting: Internal Medicine

## 2016-09-14 DIAGNOSIS — I10 Essential (primary) hypertension: Secondary | ICD-10-CM

## 2016-09-19 ENCOUNTER — Other Ambulatory Visit: Payer: Self-pay | Admitting: Internal Medicine

## 2016-09-19 DIAGNOSIS — I10 Essential (primary) hypertension: Secondary | ICD-10-CM

## 2016-09-20 ENCOUNTER — Other Ambulatory Visit: Payer: Self-pay | Admitting: Internal Medicine

## 2016-09-20 DIAGNOSIS — I10 Essential (primary) hypertension: Secondary | ICD-10-CM

## 2016-10-09 ENCOUNTER — Other Ambulatory Visit: Payer: Self-pay | Admitting: Internal Medicine

## 2016-11-12 ENCOUNTER — Encounter (HOSPITAL_COMMUNITY): Payer: Self-pay

## 2016-11-12 ENCOUNTER — Emergency Department (HOSPITAL_COMMUNITY)
Admission: EM | Admit: 2016-11-12 | Discharge: 2016-11-12 | Disposition: A | Discharge status: Home / Self Care | Payer: Medicare Other | Attending: Emergency Medicine | Admitting: Emergency Medicine

## 2016-11-12 DIAGNOSIS — Z79899 Other long term (current) drug therapy: Secondary | ICD-10-CM | POA: Insufficient documentation

## 2016-11-12 DIAGNOSIS — M62838 Other muscle spasm: Secondary | ICD-10-CM | POA: Diagnosis not present

## 2016-11-12 DIAGNOSIS — I129 Hypertensive chronic kidney disease with stage 1 through stage 4 chronic kidney disease, or unspecified chronic kidney disease: Secondary | ICD-10-CM | POA: Insufficient documentation

## 2016-11-12 DIAGNOSIS — Z7982 Long term (current) use of aspirin: Secondary | ICD-10-CM | POA: Insufficient documentation

## 2016-11-12 DIAGNOSIS — R252 Cramp and spasm: Secondary | ICD-10-CM | POA: Diagnosis not present

## 2016-11-12 DIAGNOSIS — E1140 Type 2 diabetes mellitus with diabetic neuropathy, unspecified: Secondary | ICD-10-CM | POA: Insufficient documentation

## 2016-11-12 DIAGNOSIS — N183 Chronic kidney disease, stage 3 (moderate): Secondary | ICD-10-CM | POA: Diagnosis not present

## 2016-11-12 LAB — COMPREHENSIVE METABOLIC PANEL
ALBUMIN: 4.5 g/dL (ref 3.5–5.0)
ALK PHOS: 86 U/L (ref 38–126)
ALT: 21 U/L (ref 14–54)
AST: 29 U/L (ref 15–41)
Anion gap: 13 (ref 5–15)
BILIRUBIN TOTAL: 0.8 mg/dL (ref 0.3–1.2)
BUN: 19 mg/dL (ref 6–20)
CALCIUM: 9.9 mg/dL (ref 8.9–10.3)
CO2: 24 mmol/L (ref 22–32)
Chloride: 104 mmol/L (ref 101–111)
Creatinine, Ser: 1.28 mg/dL — ABNORMAL HIGH (ref 0.44–1.00)
GFR calc Af Amer: 43 mL/min — ABNORMAL LOW (ref >60)
GFR calc non Af Amer: 37 mL/min — ABNORMAL LOW (ref >60)
GLUCOSE: 117 mg/dL — AB (ref 65–99)
Potassium: 3.4 mmol/L — ABNORMAL LOW (ref 3.5–5.1)
SODIUM: 141 mmol/L (ref 135–145)
TOTAL PROTEIN: 8.2 g/dL — AB (ref 6.5–8.1)

## 2016-11-12 LAB — URINALYSIS, ROUTINE W REFLEX MICROSCOPIC
Bilirubin Urine: NEGATIVE
Glucose, UA: NEGATIVE mg/dL
Ketones, ur: NEGATIVE mg/dL
Nitrite: NEGATIVE
PH: 6 (ref 5.0–8.0)
Protein, ur: NEGATIVE mg/dL
SPECIFIC GRAVITY, URINE: 1.009 (ref 1.005–1.030)

## 2016-11-12 LAB — MAGNESIUM: MAGNESIUM: 1.9 mg/dL (ref 1.7–2.4)

## 2016-11-12 LAB — CBC WITH DIFFERENTIAL/PLATELET
Basophils Absolute: 0 10*3/uL (ref 0.0–0.1)
Basophils Relative: 0 %
Eosinophils Absolute: 0.1 10*3/uL (ref 0.0–0.7)
Eosinophils Relative: 2 %
HEMATOCRIT: 35.8 % — AB (ref 36.0–46.0)
Hemoglobin: 12.2 g/dL (ref 12.0–15.0)
LYMPHS ABS: 1.6 10*3/uL (ref 0.7–4.0)
LYMPHS PCT: 20 %
MCH: 31.8 pg (ref 26.0–34.0)
MCHC: 34.1 g/dL (ref 30.0–36.0)
MCV: 93.2 fL (ref 78.0–100.0)
MONO ABS: 0.6 10*3/uL (ref 0.1–1.0)
MONOS PCT: 7 %
NEUTROS ABS: 5.7 10*3/uL (ref 1.7–7.7)
Neutrophils Relative %: 71 %
Platelets: 205 10*3/uL (ref 150–400)
RBC: 3.84 MIL/uL — ABNORMAL LOW (ref 3.87–5.11)
RDW: 13.8 % (ref 11.5–15.5)
WBC: 8.1 10*3/uL (ref 4.0–10.5)

## 2016-11-12 LAB — I-STAT CHEM 8, ED
BUN: 21 mg/dL — AB (ref 6–20)
CHLORIDE: 105 mmol/L (ref 101–111)
Calcium, Ion: 1.06 mmol/L — ABNORMAL LOW (ref 1.15–1.40)
Creatinine, Ser: 1.3 mg/dL — ABNORMAL HIGH (ref 0.44–1.00)
Glucose, Bld: 138 mg/dL — ABNORMAL HIGH (ref 65–99)
HEMATOCRIT: 39 % (ref 36.0–46.0)
Hemoglobin: 13.3 g/dL (ref 12.0–15.0)
POTASSIUM: 3.2 mmol/L — AB (ref 3.5–5.1)
Sodium: 142 mmol/L (ref 135–145)
TCO2: 23 mmol/L (ref 0–100)

## 2016-11-12 LAB — CK: CK TOTAL: 224 U/L (ref 38–234)

## 2016-11-12 MED ORDER — SODIUM CHLORIDE 0.9 % IV BOLUS (SEPSIS)
500.0000 mL | Freq: Once | INTRAVENOUS | Status: AC
  Administered 2016-11-12: 500 mL via INTRAVENOUS

## 2016-11-12 MED ORDER — POTASSIUM CHLORIDE CRYS ER 10 MEQ PO TBCR
10.0000 meq | EXTENDED_RELEASE_TABLET | Freq: Once | ORAL | Status: AC
  Administered 2016-11-12: 10 meq via ORAL
  Filled 2016-11-12: qty 1

## 2016-11-12 MED ORDER — METHOCARBAMOL 500 MG PO TABS: 500.0000 mg | ORAL_TABLET | Freq: Two times a day (BID) | ORAL | 0 refills | Status: DC

## 2016-11-12 NOTE — ED Provider Notes (Signed)
[facility]   CSN: [id number] Arrival date & time: 11/12/16  1035     History   Chief Complaint Chief Complaint  Patient presents with  . muscle cramps    HPI Wanda Mclaughlin is a 83 y.o. female.who presents 3 months of generalized muscle cramps. Patient reports that she has felt intermittent muscle spasms and cramps all over her body but that she mostly feels them in her bilateral upper and lower extremities. She does not know what brings on these muscle cramps or muscle spasms and states that they randomly occurred all different times. She denies any association with exertion or rest. She also notes that for the past 3 months she has intermittently felt like sometimes when she walks "she might pass out." She denies any focal weakness. She states that she has not been evaluated for these symptoms by her PCP. Her and her daughter deny any LOC. She denies any head injury. Patient reports that she also experiences some intermittent muscle cramps to the bilateral sides of her abdomen, right greater than left. Patient also has a history of lower back pain and states sometimes she feels spasms over her lower back. She denies any traumas or falls or injury. She has been able to ambulate with the assistance of a cane, which is her normal baseline. She denies any symptoms at this time. Patient denies any numbness/weakness of her arms or legs. She denies any saddle anesthesia, urinary or bowel incontinence, fevers, night sweats, unintentional weight loss. Patient denies any CP, dizziness, abdominal pain, nausea/vomiting.   The history is provided by the patient and a relative.    Past Medical History:  Diagnosis Date  . Anemia    Normal EGG, colonoscopy, and Sm. Bowel F/T 2006  . Chronic knee pain   . Chronic renal failure   . Degenerative joint disease   . Diabetes mellitus 11/05   type II  . Diabetic peripheral neuropathy (HCC)   . Duodenitis   . Gastritis   .  Health maintenance examination    Mammogram 10/11: Possible mass, right breast. No evidence of malignancy in left breast. Needle biopsy of left breast 12/11: No evidence of  carcinoma. Patient needs follow-up right breast diagnostic mammogram with ultrasound in 6 months.  . Hyperlipidemia   . Hypertension   . Osteoporosis 09/2009   T score -2.8  . Vertigo     Patient Active Problem List   Diagnosis Date Noted  . Bilateral hand pain 07/09/2016  . CKD (chronic kidney disease) stage 3, GFR 30-59 ml/min 03/29/2016  . Presbycusis of both ears 03/12/2016  . 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 Health care maintenance 03/15/2013  . Diabetes mellitus with neuropathy (HCC) 02/13/2006  . Hyperlipidemia 02/13/2006  . Essential hypertension 02/13/2006    Past Surgical History:  Procedure Laterality Date  .  Right needle localized lumpectomy.   12/11   Path report:FOCAL ATYPICAL DUCTAL HYPERPLASIA IN A BACKGROUND OF STROMAL  FIBROSIS. No evidence of carcinoma.   . ABDOMINAL HYSTERECTOMY     1980's (Fibroids)  . COLONOSCOPY     8/11 by Dr. Christella Hartigan: Normal colon, Given your age, you will not need another colonoscopy for colon cancer screening or polyp surveillance    OB History    No data available       Home Medications    Prior to Admission medications   Medication Sig Start Date End Date Taking? Authorizing Provider  ACCU-CHEK AVIVA PLUS test strip  USE AS DIRECTED 10/09/16   Rice, Jamesetta Orleans, MD  amlodipine-atorvastatin (CADUET) 10-10 MG tablet Take 1 tablet by mouth daily. 04/05/16 04/05/17  Denton Brick, MD  aspirin EC 81 MG tablet Take 1 tablet (81 mg total) by mouth daily. 07/31/11 07/30/12  Kristie Cowman, MD  calcium-vitamin D ([facility] [facility] SHELL CALCIUM-VIT D) 500-200 MG-UNIT per tablet Take 1 tablet by mouth 3 (three) times daily. 12/06/11   Kristie Cowman, MD  ferrous sulfate 325 (65 FE) MG tablet TAKE 1 TABLET BY MOUTH EVERY DAY 03/06/16   Rice, Jamesetta Orleans, MD   gabapentin (NEURONTIN) 600 MG tablet TAKE 1 TABLET BY MOUTH 3 TIMES A DAY 03/29/16   Rice, Jamesetta Orleans, MD  methocarbamol (ROBAXIN) 500 MG tablet Take 1 tablet (500 mg total) by mouth 2 (two) times daily. 11/12/16   Maxwell Caul, PA-C  metoCLOPramide (REGLAN) 5 MG tablet TAKE 1 TABLET BY MOUTH 4 TIMES A DAY BEFORE MEALS AND AT BEDTIME 04/19/16   Rice, Jamesetta Orleans, MD  olmesartan-hydrochlorothiazide (BENICAR HCT) 20-12.5 MG tablet TAKE 1 TABLET BY MOUTH EVERY DAY 09/21/16   Rice, Jamesetta Orleans, MD  omeprazole (PRILOSEC) 20 MG capsule TAKE ONE CAPSULE BY MOUTH EVERY DAY 01/22/16   Rice, Jamesetta Orleans, MD  senna (SENOKOT) 8.6 MG tablet Take 2-4 tablets by mouth at bedtime as needed for constipation. 07/05/10   Vega-Casasnovas, Ramses L, MD  VOLTAREN 1 % GEL Apply 4 g topically 4 (four) times daily. 07/05/16   Fuller Plan, MD    Family History Family History  Problem Relation Age of Onset  . Diabetes Sister     Social History Social History  Substance Use Topics  . Smoking status: Never Smoker  . Smokeless tobacco: Never Used  . Alcohol use No     Allergies   Lisinopril   Review of Systems Review of Systems  Constitutional: Negative for fever.  Eyes: Negative for visual disturbance.  Respiratory: Negative for cough and shortness of breath.   Cardiovascular: Negative for chest pain.  Gastrointestinal: Negative for abdominal pain, diarrhea, nausea and vomiting.  Genitourinary: Negative for dysuria and hematuria.  Musculoskeletal: Positive for back pain (Chronic) and myalgias.  Neurological: Negative for dizziness, weakness, numbness and headaches.     Physical Exam Updated Vital Signs BP (!) 150/55 (BP Location: Right Arm)   Pulse 78   Temp 98.5 F (36.9 C) (Oral)   Resp (!) 25   SpO2 98%   Physical Exam  Constitutional: She is oriented to person, place, and time. She appears well-developed and well-nourished.  Sitting comfortably on examination table  HENT:    Head: Normocephalic and atraumatic.  Mouth/Throat: Oropharynx is clear and moist and mucous membranes are normal.  Eyes: Pupils are equal, round, and reactive to light. Conjunctivae, EOM and lids are normal.  Neck: Full passive range of motion without pain.  Full flexion/extension and lateral movement of neck fully intact. No bony midline tenderness. No deformities or crepitus.  Cardiovascular: Normal rate, regular rhythm, normal heart sounds and normal pulses.   Pulmonary/Chest: Effort normal and breath sounds normal.  No evidence of respiratory distress. Able to speak in full sentences without difficulty.  Abdominal: Soft. Normal appearance. There is no tenderness. There is no rigidity, no guarding, no CVA tenderness and no tenderness at McBurney's point.  Abdomen is soft, non-distended, non-tender. No CVA tenderness bilaterally.   Musculoskeletal: Normal range of motion.  T or L-spine midline tenderness. Diffuse muscular tenderness to the lumbar region. No tenderness  palpation to bilateral shoulders, elbows, wrists. No tenderness palpation to bilateral knees, ankles. Full range of motion of bilateral upper and lower extremities without difficulty.  Neurological: She is alert and oriented to person, place, and time. GCS eye subscore is 4. GCS verbal subscore is 5. GCS motor subscore is 6.  Cranial nerves III-XII intact Follows commands, Moves all extremities  5/5 strength to BUE and BLE  Sensation intact throughout  Normal finger to nose. No dysdiadochokinesia. No pronator drift. No slurred speech. No facial droop.   Skin: Skin is warm and dry. Capillary refill takes less than 2 seconds.  Psychiatric: She has a normal mood and affect. Her speech is normal.  Nursing note and vitals reviewed.    ED Treatments / Results  Labs (all labs ordered are listed, but only abnormal results are displayed) Labs Reviewed  CBC WITH DIFFERENTIAL/PLATELET - Abnormal; Notable for the following:        Result Value   RBC 3.84 (*)    HCT 35.8 (*)    All other components within normal limits  URINALYSIS, ROUTINE W REFLEX MICROSCOPIC - Abnormal; Notable for the following:    APPearance HAZY (*)    Hgb urine dipstick MODERATE (*)    Leukocytes, UA MODERATE (*)    Bacteria, UA RARE (*)    Squamous Epithelial / LPF 0-5 (*)    All other components within normal limits  COMPREHENSIVE METABOLIC PANEL - Abnormal; Notable for the following:    Potassium 3.4 (*)    Glucose, Bld 117 (*)    Creatinine, Ser 1.28 (*)    Total Protein 8.2 (*)    GFR calc non Af Amer 37 (*)    GFR calc Af Amer 43 (*)    All other components within normal limits  I-STAT CHEM 8, ED - Abnormal; Notable for the following:    Potassium 3.2 (*)    BUN 21 (*)    Creatinine, Ser 1.30 (*)    Glucose, Bld 138 (*)    Calcium, Ion 1.06 (*)    All other components within normal limits  MAGNESIUM  CK    EKG  EKG Interpretation  Date/Time:  Tuesday November 12 2016 16:00:50 EDT Ventricular Rate:  86 PR Interval:  182 QRS Duration: 92 QT Interval:  380 QTC Calculation: 454 R Axis:   -48 Text Interpretation:  Sinus rhythm with Premature atrial complexes with Abberant conduction Left anterior fascicular block Left ventricular hypertrophy with repolarization abnormality Abnormal ECG sinus rhythm without PVCs  Confirmed by Crista CurbLiu, Dana [phone]([id number]) on 11/12/2016 4:26:16 PM       Radiology No results found.  Procedures Procedures (including critical care time)  Medications Ordered in ED Medications  sodium chloride 0.9 % bolus 500 mL (0 mLs Intravenous Stopped 11/12/16 1653)  potassium chloride (K-DUR,KLOR-CON) CR tablet 10 mEq (10 mEq Oral Given 11/12/16 1610)     Initial Impression / Assessment and Plan / ED Course  I have reviewed the triage vital signs and the nursing notes.  Pertinent labs & imaging results that were available during my care of the patient were reviewed by me and considered in my medical decision  making (see chart for details).     83 year old female who presents with 3 months of persistent generalized muscle spasms and muscle cramps.  Patient is afebrile, non-toxic appearing, sitting comfortably on examination table. Vital signs reviewed. Slightly hypertensive. Will reassess. Patient is not currently having the symptoms. No evidence of muscle spasms or cramps on  physical exam. She came to the ED today because she wants to figure out what is causing her symptoms.  Patient has a history of back pain in her lower back but no new changes or new symptoms. No red flag warnings. Patient with no neuro deficits. Consider dehydration vs electrolyte abnormality versus rhabdomyolysis. Check basic labs including CBC, CMP, CK, UA, magnesium, , EKG. Patient is not currently in any pain. Offered her analgesics but she declined at this time.  Labs reviewed. Magnesium is within normal limits. CMP shows potassium of 3.4. We'll plan to orally replace potassium. Creatinine is slightly elevated. CK is within normal limits. We'll plan to give fluids in the department for rehydration. UA is still pending.  Reevaluation of patient. She reports that she still does not have any muscle cramps or spasms in department. She denies any chest pain or difficulty breathing at this time. Will rehydrate with fluids. Plan to a bili in the department to see if she is symptomatic. UA is pending.  Patient signed out to [facility] on shift change. UA is still pending. If patient can ambulate in the department without being symptomatic will plan to discharge home. Will provide a very short course of muscle relaxers to see if that helps with symptoms. Discussed plan with patient. Instructed her to follow-up with her PCP in 24-48 hours for further evaluation.    Final Clinical Impressions(s) / ED Diagnoses   Final diagnoses:  Muscle spasm  Muscle cramps    New Prescriptions Discharge Medication List as of 11/12/2016  5:26 PM      START taking these medications   Details  methocarbamol (ROBAXIN) 500 MG tablet Take 1 tablet (500 mg total) by mouth 2 (two) times daily., Starting Tue 11/12/2016, Print         Maxwell Caul, PA-C 11/14/16 2144    Lavera Guise, MD 11/15/16 [phone]

## 2016-11-12 NOTE — ED Notes (Signed)
Pt ambulated well and reports "feeling pretty good."

## 2016-11-12 NOTE — ED Notes (Signed)
Took over care of patient at this time

## 2016-11-12 NOTE — ED Provider Notes (Signed)
Pt signed out to me by L Layden PA-C. She is an 83 year old female who presents with generalized muscle spasms/cramp for months and near syncope. UA is pending at shift change. Will ambulate and reassess.  6:21 PM UA shows rare bacteria, moderate hgb, moderate leukocytes, 0.5 WBC. She is asymptomatic. She was ambulated by nursing and did well. Will d/c with muscle relaxer and have her follow up with PCP.    Bethel BornGekas, Tyjae Shvartsman Marie, PA-C 11/12/16 1821    Melene PlanFloyd, Dan, DO 11/12/16 2313

## 2016-11-12 NOTE — ED Triage Notes (Signed)
Pt reports "muscle cramps" to both arms and legs and states all over her body that has been going on for months. She also reports lower back pain.

## 2016-11-12 NOTE — Discharge Instructions (Signed)
Follow-up with her primary care doctor in the next 24-48 hours for further evaluation. Call their office and arrange for an appointment to be seen.  Take medication as directed to see if that helps with the symptoms.  Make sure you're drinking plenty of fluids and staying hydrated.  Return the emergency part for any worsening pain, abdominal pain, nausea/vomiting, difficulty walking, chest pain, difficulty breathing or any other worsening or concerning symptoms.  If you do not have a primary care doctor you see regularly, please you the list below. Please call them to arrange for follow-up.    No Primary Care Doctor Call [facility]  [phone] Other agencies that provide inexpensive medical care    [facility]  [phone]    [facility]  [phone]    [facility]  [phone]    [facility]  [phone]    [facility]  [phone]    [facility]  [phone]

## 2016-11-15 ENCOUNTER — Ambulatory Visit (INDEPENDENT_AMBULATORY_CARE_PROVIDER_SITE_OTHER): Payer: Medicare Other | Admitting: Internal Medicine

## 2016-11-15 ENCOUNTER — Encounter: Payer: Self-pay | Admitting: Internal Medicine

## 2016-11-15 VITALS — BP 141/64 | HR 80 | Temp 99.1°F | Ht 63.0 in | Wt 180.4 lb

## 2016-11-15 DIAGNOSIS — M545 Low back pain, unspecified: Secondary | ICD-10-CM

## 2016-11-15 DIAGNOSIS — I10 Essential (primary) hypertension: Secondary | ICD-10-CM | POA: Diagnosis not present

## 2016-11-15 DIAGNOSIS — F5104 Psychophysiologic insomnia: Secondary | ICD-10-CM

## 2016-11-15 DIAGNOSIS — Z888 Allergy status to other drugs, medicaments and biological substances status: Secondary | ICD-10-CM | POA: Diagnosis not present

## 2016-11-15 HISTORY — DX: Psychophysiologic insomnia: F51.04

## 2016-11-15 MED ORDER — MELOXICAM 7.5 MG PO TABS: 7.5000 mg | ORAL_TABLET | Freq: Every day | ORAL | 0 refills | Status: DC

## 2016-11-15 MED ORDER — MELATONIN 5 MG PO CAPS: 5.0000 mg | ORAL_CAPSULE | Freq: Every day | ORAL | 1 refills | Status: DC

## 2016-11-15 NOTE — Progress Notes (Signed)
   CC: Back and right side pain  HPI:  [name] is a 83 y.o. woman here for increased back and right flank pain. This has been ongoing for about one week and was evaluated in the ED on 7/17.   See problem based assessment and plan below for additional details  Past Medical History:  Diagnosis Date  . Anemia    Normal EGG, colonoscopy, and Sm. Bowel F/T 2006  . Chronic knee pain   . Chronic renal failure   . Degenerative joint disease   . Diabetes mellitus 11/05   type II  . Diabetic peripheral neuropathy (HCC)   . Duodenitis   . Gastritis   . Health maintenance examination    Mammogram 10/11: Possible mass, right breast. No evidence of malignancy in left breast. Needle biopsy of left breast 12/11: No evidence of  carcinoma. Patient needs follow-up right breast diagnostic mammogram with ultrasound in 6 months.  . Hyperlipidemia   . Hypertension   . Osteoporosis 09/2009   T score -2.8  . Vertigo     Review of Systems:  Review of Systems  Constitutional: Negative for chills and fever.  Cardiovascular: Negative for chest pain and leg swelling.  Gastrointestinal: Negative for abdominal pain, constipation and diarrhea.  Musculoskeletal: Positive for back pain. Negative for falls.  Skin: Negative for rash.  Neurological: Negative for sensory change.    Physical Exam: Physical Exam  Constitutional: She is well-developed, well-nourished, and in no distress.  Cardiovascular: Normal rate and regular rhythm.   Pulmonary/Chest: Effort normal and breath sounds normal.  Abdominal: Soft. There is no tenderness.  Musculoskeletal:  Tenderness to palpation over her lower back and right paraspinal muscles, extending laterally around the right side at the waistline of her pants. No rash or discoloration is present. No pain or tenderness in the lateral hip or thigh    Vitals:   11/15/16 1450  BP: (!) 141/64  Pulse: 80  Temp: 99.1 F (37.3 C)  TempSrc: Oral  SpO2: 98%    Weight: 180 lb 6.4 oz (81.8 kg)  Height: 5\' 3"  (1.6 m)    Assessment & Plan:   See Encounters Tab for problem based charting.  Patient discussed with Dr. Cyndie ChimeGranfortuna

## 2016-11-15 NOTE — Patient Instructions (Signed)
It was a pleasure to see you today Wanda Mclaughlin.  I recommend we start a pain medicine with anti-inflammatory properties today meloxicam (Mobic) 7.5mg  daily. This is generally more effective for treating pain due to musculoskeletal causes.  If your pain worsens despite this or you start to notice a rash in the affected area please call us back to re check this and possibly change the treatment.  For your insomnia I recommend trying melatonin 5mg  as a sleep aid at night. If this does not help you can also try taking benadryl 25mg  at night before bed time. This medicine can cause drowsiness in the following day so be careful when starting this.

## 2016-11-18 NOTE — Assessment & Plan Note (Signed)
She is having worse trouble sleeping with associated increased daytime sleepiness. She thinks this is worse since the death of a family member about 6 weeks ago. This is from increased sleep latency, lying in bed up to 2 hours before able to sleep. She is limiting daytime napping despite being tired. She has not tried taking anything for insomnia in the past.  Insomnia. Unclear if this is a pathologic enough to be worsening somatic symptoms. Considering her age and risk of falls I would recommend conservative interventions first.  Melatonin 5mg  qHS PRN for sleep Consider low dose benadryl qHS PRN if not having any benefit I would like to avoid sedative hypnotic medications for insomnia

## 2016-11-18 NOTE — Progress Notes (Signed)
Medicine attending: Medical history, presenting problems, physical findings, and medications, reviewed with resident physician Dr Christopher Rice on the day of the patient visit and I concur with his evaluation and management plan. 

## 2016-11-18 NOTE — Assessment & Plan Note (Signed)
HPI: Her pain is continuous and only slightly worsened with movement or change in position. She cannot recall any particular preceding event or sudden timing of onset. ED workup was unremarkable on 7/17 and felt to be musculoskeletal in nature. She is not currently taking anything stronger than tylenol for pain. She took some ibuprofen earlier but has avoided using this continuously because of previous instructions not to. There is no rash or overlying skin change.  A: Likely musculoskeletal pain at right lower back and side muscles. She has some chronic renal disease but not so severe that a short course of NSAIDs is very likely to cause a problem. I would like to avoid starting opioid analgesics for this most likely self limited pain process. Given the distribution this would less likely be pain preceding a shingles rash, although it has been ongoing one week without changes and is not entirely characteristic.  P: Meloxicam 7.5mg  daily for 2 weeks RTC if skin rash develops or pain becomes more severe

## 2016-11-18 NOTE — Assessment & Plan Note (Signed)
BP Readings from Last 3 Encounters:  11/15/16 (!) 141/64  11/12/16 (!) 150/55  07/05/16 137/69    Lab Results  Component Value Date   NA 141 11/12/2016   K 3.4 (L) 11/12/2016   CREATININE 1.28 (H) 11/12/2016    Assessment: Blood pressure control: Controlled Progress toward BP goal:  At goal Comments: BP 141/64 is well controlled for an 83 year old woman. She had recent labs drawn at the ED visit on 7/17.  Plan: Medications:  continue current medications olmesartan-HCTZ 20-12.5mg  daily   Other plans: Plan to follow up at previous scheduled September appt

## 2016-12-15 ENCOUNTER — Other Ambulatory Visit: Payer: Self-pay | Admitting: Internal Medicine

## 2017-01-03 ENCOUNTER — Encounter: Payer: Self-pay | Admitting: Internal Medicine

## 2017-01-17 ENCOUNTER — Encounter: Payer: Self-pay | Admitting: Internal Medicine

## 2017-01-17 ENCOUNTER — Ambulatory Visit (INDEPENDENT_AMBULATORY_CARE_PROVIDER_SITE_OTHER): Payer: Medicare Other | Admitting: Internal Medicine

## 2017-01-17 VITALS — BP 146/65 | HR 72 | Temp 98.6°F | Ht 63.0 in | Wt 177.1 lb

## 2017-01-17 DIAGNOSIS — E1122 Type 2 diabetes mellitus with diabetic chronic kidney disease: Secondary | ICD-10-CM | POA: Diagnosis not present

## 2017-01-17 DIAGNOSIS — I129 Hypertensive chronic kidney disease with stage 1 through stage 4 chronic kidney disease, or unspecified chronic kidney disease: Secondary | ICD-10-CM

## 2017-01-17 DIAGNOSIS — Z79899 Other long term (current) drug therapy: Secondary | ICD-10-CM | POA: Diagnosis not present

## 2017-01-17 DIAGNOSIS — N183 Chronic kidney disease, stage 3 (moderate): Secondary | ICD-10-CM

## 2017-01-17 DIAGNOSIS — I10 Essential (primary) hypertension: Secondary | ICD-10-CM

## 2017-01-17 DIAGNOSIS — E1140 Type 2 diabetes mellitus with diabetic neuropathy, unspecified: Secondary | ICD-10-CM | POA: Diagnosis not present

## 2017-01-17 DIAGNOSIS — M545 Low back pain, unspecified: Secondary | ICD-10-CM

## 2017-01-17 DIAGNOSIS — G4700 Insomnia, unspecified: Secondary | ICD-10-CM

## 2017-01-17 MED ORDER — METHOCARBAMOL 500 MG PO TABS: 500.0000 mg | ORAL_TABLET | Freq: Four times a day (QID) | ORAL | 0 refills | Status: DC | PRN

## 2017-01-17 MED ORDER — GABAPENTIN 400 MG PO CAPS: 400.0000 mg | ORAL_CAPSULE | Freq: Two times a day (BID) | ORAL | 2 refills | Status: DC

## 2017-01-17 MED ORDER — METOCLOPRAMIDE HCL 5 MG PO TABS: ORAL_TABLET | ORAL | 3 refills | Status: DC

## 2017-01-17 MED ORDER — OMEPRAZOLE 20 MG PO CPDR: 20.0000 mg | DELAYED_RELEASE_CAPSULE | Freq: Every day | ORAL | 0 refills | Status: DC

## 2017-01-17 MED ORDER — AMLODIPINE-ATORVASTATIN 10-10 MG PO TABS: 1.0000 | ORAL_TABLET | Freq: Every day | ORAL | 3 refills | Status: AC

## 2017-01-17 MED ORDER — OLMESARTAN MEDOXOMIL-HCTZ 20-12.5 MG PO TABS: 1.0000 | ORAL_TABLET | Freq: Every day | ORAL | 1 refills | Status: DC

## 2017-01-17 NOTE — Progress Notes (Signed)
   CC: Hands and feet pain  HPI:  [name] is a 83 y.o. woman here for follow up of right sided lower back pain but now has worsening symptoms of pain in her hands and feet.   See problem based assessment and plan below for additional details  Past Medical History:  Diagnosis Date  . Anemia    Normal EGG, colonoscopy, and Sm. Bowel F/T 2006  . Chronic knee pain   . Chronic renal failure   . Degenerative joint disease   . Diabetes mellitus 11/05   type II  . Diabetic peripheral neuropathy (HCC)   . Duodenitis   . Gastritis   . Health maintenance examination    Mammogram 10/11: Possible mass, right breast. No evidence of malignancy in left breast. Needle biopsy of left breast 12/11: No evidence of  carcinoma. Patient needs follow-up right breast diagnostic mammogram with ultrasound in 6 months.  . Hyperlipidemia   . Hypertension   . Osteoporosis 09/2009   T score -2.8  . Vertigo     Review of Systems:  Review of Systems  Constitutional: Positive for malaise/fatigue.  Respiratory: Negative for shortness of breath.   Cardiovascular: Negative for leg swelling.  Gastrointestinal: Negative for abdominal pain.  Genitourinary: Negative for dysuria, frequency and urgency.  Musculoskeletal: Positive for back pain and joint pain. Negative for falls, myalgias and neck pain.  Skin: Negative for itching and rash.  Neurological: Positive for tingling. Negative for focal weakness and weakness.  Psychiatric/Behavioral: The patient has insomnia.     Physical Exam: Physical Exam  Constitutional: She is well-developed, well-nourished, and in no distress.  HENT:  Mouth/Throat: Oropharynx is clear and moist.  Cardiovascular: Normal rate and regular rhythm.   Pulmonary/Chest: Effort normal and breath sounds normal.  Abdominal: Soft. There is no tenderness.  Musculoskeletal: She exhibits no edema.  Mild right paraspinal muscle tenderness extending between the iliac crest and lower  costal margin, without overlying skin changes No swelling, deformity, or appreciable tenderness in hands or feet    Vitals:   01/17/17 1320  BP: (!) 146/65  Pulse: 72  Temp: 98.6 F (37 C)  TempSrc: Oral  SpO2: 100%  Weight: 177 lb 1.6 oz (80.3 kg)  Height:  (1.6 m)    Assessment & Plan:   See Encounters Tab for problem based charting.  Patient discussed with Dr. Oswaldo Done

## 2017-01-17 NOTE — Patient Instructions (Signed)
It was a pleasure to see you today [name].  I think your hand and feet pains are from diabetic neuropathy. I would like you to restart taking gabapentin at a lower dose now which should help with these symptoms partially. Let us know if you have trouble with sleepiness when taking this medicine. If so you may try switching to taking this only at night.  I think your back pain is related to muscle stiffness from low flexibility in your back. I will prescribe a short term muscle relaxant medicine you can try taking as needed but the most important treatment is to work on stretches forward and to the sides for your lower back.

## 2017-01-20 ENCOUNTER — Telehealth: Payer: Self-pay | Admitting: *Deleted

## 2017-01-20 MED ORDER — METHOCARBAMOL 750 MG PO TABS: 750.0000 mg | ORAL_TABLET | Freq: Three times a day (TID) | ORAL | 0 refills | Status: DC | PRN

## 2017-01-20 NOTE — Telephone Encounter (Signed)
Per [facility] methocarbamol 500 unavailable/backordered. Has 750 mg available. Requesting alternative. Med ordered 9/21/ visit.

## 2017-01-20 NOTE — Assessment & Plan Note (Signed)
HPI: She remains diet controlled alone off all medication. However she has chronic neuropathy attributed to her diabetes which was previously less well controlled. Today her pain is worst in the hands and feet bilaterally. There is no associated weakness or numbness but there is a sensation of tingling. The pain is sometimes burning in nature and she relieves this in her hands by holding a bottle of frozen water. She previously took gabapentin  TID for neuropathy but has been out of this medication for at least a few weeks.  A: Diabetic neuropathy currently in exacerbation due to discontinued medical treatment. Her diabetes is controlled and should not be causing progressive symptoms otherwise. The symptoms are too symmetric and distributed peripherally to suggest impingement.  P: Resume gabapentin at lower  BID dose - more appropriate for current CKD stage 3

## 2017-01-20 NOTE — Assessment & Plan Note (Signed)
BP Readings from Last 3 Encounters:  01/17/17 (!) 146/65  11/15/16 (!) 141/64  11/12/16 (!) 150/55    Lab Results  Component Value Date   NA 141 11/12/2016   K 3.4 (L) 11/12/2016   CREATININE 1.28 (H) 11/12/2016    Assessment: Blood pressure control: Slightly above goal <140/90  Progress toward BP goal:  No improvement Comments: She has missed some doses of the olmesartan-HCTZ 20-12.5mg  due to needing refills ordered. She may require dose adjustment if hypertensive at next follow up after not missing any doses.  Plan: Medications:  continue current medications Cadeut 10-10mg , olmesartan-HCTZ 20-12.5mg  Educational resources provided: brochure (denies need ) Self management tools provided:   Other plans: RTC in 3 months

## 2017-01-20 NOTE — Progress Notes (Signed)
Internal Medicine Clinic Attending  Case discussed with Dr. Rice at the time of the visit.  We reviewed the resident's history and exam and pertinent patient test results.  I agree with the assessment, diagnosis, and plan of care documented in the resident's note.  

## 2017-01-20 NOTE — Telephone Encounter (Signed)
I changed her prescription since  should also be appropriate q8hr PRN

## 2017-01-20 NOTE — Assessment & Plan Note (Signed)
HPI: Her back pain was partially relieved with meloxicam but she completed taking this for 2 weeks. She continues to have the same soreness and discomfort without radiation and no overlying skin changes.  A: Musculoskeletal right lower back pain. I do not think she should continued scheduled NSAIDs with CKD but intermittent use would be fine. She has previously reported relief with muscle relaxants although non-pharmacological treatment will be the most effective long term.  P: Reordered robaxin PRN x10 days Recommended stretching exercises specifically forward bending, side bending, and side rotation for lower back ROM Ibuprofen PRN

## 2017-01-20 NOTE — Addendum Note (Signed)
Addended by: Fuller Plan on: 01/20/2017 01:33 PM   Modules accepted: Orders

## 2017-02-21 ENCOUNTER — Encounter: Payer: Self-pay | Admitting: Internal Medicine

## 2017-04-13 ENCOUNTER — Other Ambulatory Visit: Payer: Self-pay | Admitting: Internal Medicine

## 2017-05-02 ENCOUNTER — Encounter: Payer: Self-pay | Admitting: Internal Medicine

## 2017-05-02 ENCOUNTER — Other Ambulatory Visit: Payer: Self-pay

## 2017-05-02 ENCOUNTER — Encounter (INDEPENDENT_AMBULATORY_CARE_PROVIDER_SITE_OTHER): Payer: Self-pay

## 2017-05-02 ENCOUNTER — Other Ambulatory Visit: Payer: Self-pay | Admitting: Internal Medicine

## 2017-05-02 ENCOUNTER — Ambulatory Visit (INDEPENDENT_AMBULATORY_CARE_PROVIDER_SITE_OTHER): Payer: Medicare Other | Admitting: Internal Medicine

## 2017-05-02 VITALS — BP 137/62 | HR 81 | Temp 98.4°F | Ht 63.0 in | Wt 184.9 lb

## 2017-05-02 DIAGNOSIS — M545 Low back pain, unspecified: Secondary | ICD-10-CM

## 2017-05-02 DIAGNOSIS — H9190 Unspecified hearing loss, unspecified ear: Secondary | ICD-10-CM

## 2017-05-02 DIAGNOSIS — G4700 Insomnia, unspecified: Secondary | ICD-10-CM | POA: Diagnosis not present

## 2017-05-02 DIAGNOSIS — E1140 Type 2 diabetes mellitus with diabetic neuropathy, unspecified: Secondary | ICD-10-CM | POA: Diagnosis not present

## 2017-05-02 DIAGNOSIS — R252 Cramp and spasm: Secondary | ICD-10-CM | POA: Diagnosis not present

## 2017-05-02 DIAGNOSIS — N183 Chronic kidney disease, stage 3 (moderate): Secondary | ICD-10-CM | POA: Diagnosis not present

## 2017-05-02 DIAGNOSIS — Z79899 Other long term (current) drug therapy: Secondary | ICD-10-CM

## 2017-05-02 DIAGNOSIS — M11862 Other specified crystal arthropathies, left knee: Secondary | ICD-10-CM | POA: Diagnosis not present

## 2017-05-02 DIAGNOSIS — M1180 Other specified crystal arthropathies, unspecified site: Secondary | ICD-10-CM

## 2017-05-02 DIAGNOSIS — I129 Hypertensive chronic kidney disease with stage 1 through stage 4 chronic kidney disease, or unspecified chronic kidney disease: Secondary | ICD-10-CM

## 2017-05-02 DIAGNOSIS — E1122 Type 2 diabetes mellitus with diabetic chronic kidney disease: Secondary | ICD-10-CM

## 2017-05-02 DIAGNOSIS — I10 Essential (primary) hypertension: Secondary | ICD-10-CM

## 2017-05-02 LAB — POCT GLYCOSYLATED HEMOGLOBIN (HGB A1C): HEMOGLOBIN A1C: 6.8

## 2017-05-02 LAB — GLUCOSE, CAPILLARY: GLUCOSE-CAPILLARY: 109 mg/dL — AB (ref 65–99)

## 2017-05-02 MED ORDER — METHOCARBAMOL 750 MG PO TABS: 750.0000 mg | ORAL_TABLET | Freq: Three times a day (TID) | ORAL | 0 refills | Status: DC | PRN

## 2017-05-02 MED ORDER — VOLTAREN 1 % TD GEL: 4.0000 g | Freq: Four times a day (QID) | TRANSDERMAL | 3 refills | Status: DC

## 2017-05-02 NOTE — Progress Notes (Signed)
CC: Right low back pain, leg cramps  HPI:  [name] is a 84 y.o. female with PMHx detailed below presenting with leg cramps and right low back pain.  See problem based assessment and plan below for additional details.  Acute right-sided low back pain without sciatica She is having right sided low back and flank pain again very similar to earlier this year. This pain is not associated with any particular activity and as before she notices no overlying skin changes and no radiation of the pain. A: This still appears most likely muscular pain unless she has a very unusual distribution of an impinged nerve. She does not consistently stretch or exercise. P: Reordered robaxin PRN x10 days Try to limit NSAIDs to topical or intermittent PO for her CKD3  Calcium pyrophosphate crystal arthritis She is having increased left knee pain for the past 1-2 weeks. The knee does not appear acutely inflamed or deformed on examination. There is crepitus more consistent with DJD than pseudogout. She did have a good benefit to steroid injection of the left knee in the past. A: Most likely OA, knee is without warmth, erythema, or large effusion P: Ordered topical voltaren gel Recommend RTC in 2 weeks and can perform injection if knee isn't improving  Diabetes mellitus with neuropathy (HCC) I am unclear on her adherence to gabapentin as a neuropathy treatment as she did not bring this medication to clinic and was uncertain when asked about if it helps. Her pain complaints today seem more related to arthritis than neuropathy. Besides this her diabetes remains well controlled with diet alone. A: T2DM diet controlled, well controlled Hgb A1c 6.8% today P: Reordered gabapentin 400mg  BID  Essential hypertension Her blood pressure is slightly above goal today and has been for many months. She brought her medications and is taking them appropriately as prescribed. She is never lightheaded or  dizzy. A: Hypertension above goal of <130/80 for diabetic with CKD3 P: Recommended doubling olmesartan-HCTZ to 2 tablets daily (40mg -25mg  dose) Continue amlodipine 10mg  daily IMPORTANT TO RTC in 2 weeks for Bmet   Past Medical History:  Diagnosis Date  . Anemia    Normal EGG, colonoscopy, and Sm. Bowel F/T 2006  . Chronic knee pain   . Chronic renal failure   . Degenerative joint disease   . Diabetes mellitus 11/05   type II  . Diabetic peripheral neuropathy (HCC)   . Duodenitis   . Gastritis   . Health maintenance examination    Mammogram 10/11: Possible mass, right breast. No evidence of malignancy in left breast. Needle biopsy of left breast 12/11: No evidence of  carcinoma. Patient needs follow-up right breast diagnostic mammogram with ultrasound in 6 months.  . Hyperlipidemia   . Hypertension   . Osteoporosis 09/2009   T score -2.8  . Vertigo     Review of Systems: Review of Systems  HENT: Positive for hearing loss.   Respiratory: Negative for shortness of breath.   Cardiovascular: Negative for chest pain and leg swelling.  Gastrointestinal: Negative for abdominal pain, constipation and diarrhea.  Genitourinary: Negative for dysuria.  Musculoskeletal: Positive for back pain and joint pain. Negative for falls.  Skin: Negative for rash.  Neurological: Negative for sensory change.  Psychiatric/Behavioral: The patient has insomnia.      Physical Exam: Vitals:   05/02/17 1347 05/02/17 1535  BP: (!) 153/69 137/62  Pulse: 81 81  Temp: 98.4 F (36.9 C)   TempSrc: Oral   SpO2: 98%  Weight: 184 lb 14.4 oz (83.9 kg)   Height: 5\' 3"  (1.6 m)    Constitutional: She is well-developed, well-nourished, and in no distress.  HEENT: Hard of hearing but grossly intact b/l Cardiovascular: Normal rate and regular rhythm.   Pulmonary/Chest: Effort normal and breath sounds normal.  Abdominal: Soft. There is no tenderness.  Musculoskeletal:  Tenderness to palpation over her  lower back and right paraspinal muscles, extending laterally around the right side at the waistline of her pants. No rash or discoloration is present. Left knee has some patellofemoral crepitus but is not deformed or unstable   Assessment & Plan:   See encounters tab for problem based medical decision making.   Patient discussed with Dr. Heide SparkNarendra

## 2017-05-02 NOTE — Patient Instructions (Signed)
FOLLOW-UP INSTRUCTIONS When: 2 weeks For: Kidney function test, leg pain What to bring: Medications  It was a pleasure to see you today [name].  Your blood pressure continues to be a little bit too high. You should increase the dose of your olmesartan-HCTZ to 2 tablets daily and we will need to see you again in about 2 weeks to re-check this treatment.  I believe your back pain is related to muscular soreness. I recommend trying the muscle relaxant Robaxin for this and see if it is helpful. The other good treatment is to try and stretch the affected area.  Your knee pain seems to be related to osteoarthritis. I have prescribed the topical voltaren gel for this. If the pain is not improving we can try a steroid injection to relieve this inflammation.  I have re-ordered the gabapentin and I recommend continuing this for neuropathy.

## 2017-05-06 NOTE — Progress Notes (Signed)
Internal Medicine Clinic Attending  Case discussed with Dr. Rice at the time of the visit.  We reviewed the resident's history and exam and pertinent patient test results.  I agree with the assessment, diagnosis, and plan of care documented in the resident's note.  

## 2017-05-06 NOTE — Assessment & Plan Note (Signed)
I am unclear on her adherence to gabapentin as a neuropathy treatment as she did not bring this medication to clinic and was uncertain when asked about if it helps. Her pain complaints today seem more related to arthritis than neuropathy. Besides this her diabetes remains well controlled with diet alone. A: T2DM diet controlled, well controlled Hgb A1c 6.8% today P: Reordered gabapentin 400mg  BID

## 2017-05-06 NOTE — Assessment & Plan Note (Signed)
Her blood pressure is slightly above goal today and has been for many months. She brought her medications and is taking them appropriately as prescribed. She is never lightheaded or dizzy. A: Hypertension above goal of <130/80 for diabetic with CKD3 P: Recommended doubling olmesartan-HCTZ to 2 tablets daily (40mg -25mg  dose) Continue amlodipine 10mg  daily IMPORTANT TO RTC in 2 weeks for Bmet

## 2017-05-06 NOTE — Assessment & Plan Note (Signed)
She is having right sided low back and flank pain again very similar to earlier this year. This pain is not associated with any particular activity and as before she notices no overlying skin changes and no radiation of the pain. A: This still appears most likely muscular pain unless she has a very unusual distribution of an impinged nerve. She does not consistently stretch or exercise. P: Reordered robaxin PRN x10 days Try to limit NSAIDs to topical or intermittent PO for her CKD3

## 2017-05-06 NOTE — Assessment & Plan Note (Signed)
She is having increased left knee pain for the past 1-2 weeks. The knee does not appear acutely inflamed or deformed on examination. There is crepitus more consistent with DJD than pseudogout. She did have a good benefit to steroid injection of the left knee in the past. A: Most likely OA, knee is without warmth, erythema, or large effusion P: Ordered topical voltaren gel Recommend RTC in 2 weeks and can perform injection if knee isn't improving

## 2017-05-12 ENCOUNTER — Other Ambulatory Visit: Payer: Self-pay | Admitting: Internal Medicine

## 2017-05-16 ENCOUNTER — Encounter: Payer: Self-pay | Admitting: Internal Medicine

## 2017-05-23 ENCOUNTER — Other Ambulatory Visit: Payer: Self-pay | Admitting: Internal Medicine

## 2017-05-23 DIAGNOSIS — I10 Essential (primary) hypertension: Secondary | ICD-10-CM

## 2017-05-26 ENCOUNTER — Other Ambulatory Visit: Payer: Self-pay

## 2017-05-26 DIAGNOSIS — I10 Essential (primary) hypertension: Secondary | ICD-10-CM

## 2017-05-26 MED ORDER — OLMESARTAN MEDOXOMIL-HCTZ 20-12.5 MG PO TABS: 2.0000 | ORAL_TABLET | Freq: Every day | ORAL | 0 refills | Status: DC

## 2017-05-26 NOTE — Telephone Encounter (Signed)
Pt stated at her last visit, she was informed to take 2 BP pills and the pharmacy will not give her anymore w/o a new rx and she's out. Asked about f/u appt to re-check her BP ; stated she was given an appt for 3/8 and this was why she has not been back.

## 2017-05-26 NOTE — Telephone Encounter (Signed)
olmesartan-hydrochlorothiazide (BENICAR HCT) 20-12.5 MG tablet, Refill request @ [facility].

## 2017-07-04 ENCOUNTER — Other Ambulatory Visit: Payer: Self-pay

## 2017-07-04 ENCOUNTER — Encounter: Payer: Self-pay | Admitting: Internal Medicine

## 2017-07-04 ENCOUNTER — Ambulatory Visit (INDEPENDENT_AMBULATORY_CARE_PROVIDER_SITE_OTHER): Payer: Medicare Other | Admitting: Internal Medicine

## 2017-07-04 VITALS — BP 138/68 | HR 73 | Temp 98.2°F | Ht 63.0 in | Wt 179.4 lb

## 2017-07-04 DIAGNOSIS — Z79899 Other long term (current) drug therapy: Secondary | ICD-10-CM

## 2017-07-04 DIAGNOSIS — M25561 Pain in right knee: Secondary | ICD-10-CM | POA: Diagnosis not present

## 2017-07-04 DIAGNOSIS — G629 Polyneuropathy, unspecified: Secondary | ICD-10-CM

## 2017-07-04 DIAGNOSIS — I129 Hypertensive chronic kidney disease with stage 1 through stage 4 chronic kidney disease, or unspecified chronic kidney disease: Secondary | ICD-10-CM

## 2017-07-04 DIAGNOSIS — G4700 Insomnia, unspecified: Secondary | ICD-10-CM

## 2017-07-04 DIAGNOSIS — H9190 Unspecified hearing loss, unspecified ear: Secondary | ICD-10-CM

## 2017-07-04 DIAGNOSIS — M545 Low back pain, unspecified: Secondary | ICD-10-CM

## 2017-07-04 DIAGNOSIS — M542 Cervicalgia: Secondary | ICD-10-CM | POA: Diagnosis not present

## 2017-07-04 DIAGNOSIS — M25511 Pain in right shoulder: Secondary | ICD-10-CM | POA: Diagnosis not present

## 2017-07-04 DIAGNOSIS — F5104 Psychophysiologic insomnia: Secondary | ICD-10-CM | POA: Diagnosis not present

## 2017-07-04 DIAGNOSIS — N183 Chronic kidney disease, stage 3 unspecified: Secondary | ICD-10-CM

## 2017-07-04 DIAGNOSIS — M25512 Pain in left shoulder: Secondary | ICD-10-CM | POA: Diagnosis not present

## 2017-07-04 MED ORDER — METHOCARBAMOL 750 MG PO TABS: 750.0000 mg | ORAL_TABLET | Freq: Three times a day (TID) | ORAL | 1 refills | Status: DC | PRN

## 2017-07-04 NOTE — Patient Instructions (Signed)
It was a pleasure to see you today [name].  I have reordered your medications that were out, if anything else is run out before we see you again please have the pharmacy request refills.  I am glad you are getting some relief with your back and leg pains.  I am concerned that you are feeling bad much of the time. I am checking blood work today and make sure this isn't a medical problem. If this all looks fine but you continue to feel poorly I would like to discuss that again in a couple months.

## 2017-07-04 NOTE — Progress Notes (Signed)
CC: Follow up for neuropathy, neck pain  HPI:  [name] is a 84 y.o. female with PMHx detailed below presenting for follow up of her peripheral neuropathy and neck pains. Since our previous visit she thinks restarting the gabapentin has improved her leg pain partially. She rates it about a 3/10 currently. The right knee pain seems improved since before as well as she never pursued injection for symptom relief of this. She is using the flexeril intermittently and states this helps her shoulders and neck in particular. It does cause drowsiness and she describes using it late in the day then needing to sleep. During our conversation she also reports a nonspecific feeling of "not doing well" about which she has a hard time providing specific examples. She says this is the case more often than it is not. She denies stopping any social activities, changes in eating habits, and her daughters continue to visit her regularly. She does feel a low level of energy and agrees that her mood feels depressed during these times.  See problem based assessment and plan below for additional details.  CKD (chronic kidney disease) stage 3, GFR 30-59 ml/min A: She has been stable with adequately controlled hypertension. Last metabolic panel was checked in 07/979. I think it is unlikely she has symptomatic anemia or uremia at this time leading to her generalized symptoms but will check a repeat of serum chemistry today.  P: Check Bmet, CBC There may be small progression of chronic medical renal disease from last year with SCr 1.18->1.40 and Hgb 12.2->10.8 but far too small to be driving symptoms  Acute right-sided low back pain without sciatica Her back pain seems greatly improved compared to our visit in January. She was still using the robaxin intermittently for neck and shoulder pains. Her drowsiness seems to be limited enough in duration that the benefit outweighs the risk and allows more ability to sleep  normally.  Reordered robaxin 750mg  PO PRN x30 tablets  Psychophysiological insomnia I am suspicious for depressive symptoms in this 84 year old woman with her disrupted sleep, dysphoric mood, and decreased energy level. I will check Bmet/CBC/TSH to assess for underlying metabolic disease. PHQ-9 is 12 today.  Recommend starting a SSRI for depression if symptoms remains similar at next follow up   Past Medical History:  Diagnosis Date  . Anemia    Normal EGG, colonoscopy, and Sm. Bowel F/T 2006  . Chronic knee pain   . Chronic renal failure   . Degenerative joint disease   . Diabetes mellitus 11/05   type II  . Diabetic peripheral neuropathy (HCC)   . Duodenitis   . Gastritis   . Health maintenance examination    Mammogram 10/11: Possible mass, right breast. No evidence of malignancy in left breast. Needle biopsy of left breast 12/11: No evidence of  carcinoma. Patient needs follow-up right breast diagnostic mammogram with ultrasound in 6 months.  . Hyperlipidemia   . Hypertension   . Osteoporosis 09/2009   T score -2.8  . Vertigo     Review of Systems: Review of Systems  Constitutional: Negative for weight loss.  HENT: Positive for hearing loss.   Eyes: Negative for blurred vision.  Respiratory: Negative for shortness of breath.   Cardiovascular: Negative for chest pain and leg swelling.  Gastrointestinal: Negative for abdominal pain.  Genitourinary: Negative for dysuria.  Musculoskeletal: Positive for joint pain and neck pain. Negative for falls.  Neurological: Negative for sensory change.  Physical Exam: Vitals:   07/04/17 1425  BP: 138/68  Pulse: 73  Temp: 98.2 F (36.8 C)  TempSrc: Oral  SpO2: 98%  Weight: 179 lb 6.4 oz (81.4 kg)  Height: 5\' 3"  (1.6 m)   GENERAL- alert, co-operative, NAD HEENT- Atraumatic, PERRL, EOMI, oral mucosa appears moist CARDIAC- RRR, no murmurs, rubs or gallops. RESP- CTAB, no wheezes or crackles. ABDOMEN- Soft, nontender, no  guarding or rebound BACK- Paraspinal muscle tenderness in neck, trapezius muscles NEURO- Strength upper and lower extremities- 5/5, Sensation intact globally EXTREMITIES- Symmetric, no pedal edema. SKIN- Warm, dry, No rash or lesion. PSYCH- Pleasant demeanour, appropriate thought content and speech.   Assessment & Plan:   See encounters tab for problem based medical decision making.   Patient discussed with Dr. Rogelia BogaButcher

## 2017-07-05 LAB — CBC
HEMATOCRIT: 32.5 % — AB (ref 34.0–46.6)
HEMOGLOBIN: 10.8 g/dL — AB (ref 11.1–15.9)
MCH: 32 pg (ref 26.6–33.0)
MCHC: 33.2 g/dL (ref 31.5–35.7)
MCV: 96 fL (ref 79–97)
Platelets: 277 10*3/uL (ref 150–379)
RBC: 3.37 x10E6/uL — AB (ref 3.77–5.28)
RDW: 14.1 % (ref 12.3–15.4)
WBC: 6.9 10*3/uL (ref 3.4–10.8)

## 2017-07-05 LAB — BMP8+ANION GAP
Anion Gap: 15 mmol/L (ref 10.0–18.0)
BUN / CREAT RATIO: 13 (ref 12–28)
BUN: 18 mg/dL (ref 8–27)
CALCIUM: 9.7 mg/dL (ref 8.7–10.3)
CO2: 22 mmol/L (ref 20–29)
CREATININE: 1.4 mg/dL — AB (ref 0.57–1.00)
Chloride: 102 mmol/L (ref 96–106)
GFR calc Af Amer: 39 mL/min/{1.73_m2} — ABNORMAL LOW (ref >59)
GFR calc non Af Amer: 34 mL/min/{1.73_m2} — ABNORMAL LOW (ref >59)
Glucose: 114 mg/dL — ABNORMAL HIGH (ref 65–99)
Potassium: 4.3 mmol/L (ref 3.5–5.2)
Sodium: 139 mmol/L (ref 134–144)

## 2017-07-05 LAB — TSH: TSH: 2.33 u[IU]/mL (ref 0.450–4.500)

## 2017-07-07 ENCOUNTER — Other Ambulatory Visit: Payer: Self-pay | Admitting: Internal Medicine

## 2017-07-07 NOTE — Assessment & Plan Note (Signed)
A: She has been stable with adequately controlled hypertension. Last metabolic panel was checked in 1/61097/2018. I think it is unlikely she has symptomatic anemia or uremia at this time leading to her generalized symptoms but will check a repeat of serum chemistry today.  P: Check Bmet, CBC There may be small progression of chronic medical renal disease from last year with SCr 1.18->1.40 and Hgb 12.2->10.8 but far too small to be driving symptoms

## 2017-07-07 NOTE — Telephone Encounter (Signed)
Next appt scheduled  10/03/17 with PCP.

## 2017-07-07 NOTE — Assessment & Plan Note (Signed)
I am suspicious for depressive symptoms in this 84 year old woman with her disrupted sleep, dysphoric mood, and decreased energy level. I will check Bmet/CBC/TSH to assess for underlying metabolic disease. PHQ-9 is 12 today.  Recommend starting a SSRI for depression if symptoms remains similar at next follow up

## 2017-07-07 NOTE — Assessment & Plan Note (Signed)
Her back pain seems greatly improved compared to our visit in January. She was still using the robaxin intermittently for neck and shoulder pains. Her drowsiness seems to be limited enough in duration that the benefit outweighs the risk and allows more ability to sleep normally.  Reordered robaxin 750mg  PO PRN x30 tablets

## 2017-07-07 NOTE — Progress Notes (Signed)
Internal Medicine Clinic Attending  Case discussed with Dr. Rice at the time of the visit.  We reviewed the resident's history and exam and pertinent patient test results.  I agree with the assessment, diagnosis, and plan of care documented in the resident's note.  

## 2017-07-09 ENCOUNTER — Telehealth: Payer: Self-pay | Admitting: *Deleted

## 2017-07-09 DIAGNOSIS — M545 Low back pain, unspecified: Secondary | ICD-10-CM

## 2017-07-09 NOTE — Telephone Encounter (Signed)
Received faxed [facility] from [facility] for alternative med to methocarbamol as this is non-formulary and not covered by patient's insurance. Kinnie FeilL.  Grosser, RN, BSN

## 2017-07-10 MED ORDER — CYCLOBENZAPRINE HCL 10 MG PO TABS: 10.0000 mg | ORAL_TABLET | Freq: Every day | ORAL | 0 refills | Status: DC | PRN

## 2017-07-10 NOTE — Telephone Encounter (Signed)
I placed a new order for 10mg  cyclobenzaprine as an approximate equivalent.

## 2017-07-31 ENCOUNTER — Other Ambulatory Visit: Payer: Self-pay | Admitting: Internal Medicine

## 2017-08-07 ENCOUNTER — Other Ambulatory Visit: Payer: Self-pay | Admitting: Internal Medicine

## 2017-08-07 DIAGNOSIS — M545 Low back pain, unspecified: Secondary | ICD-10-CM

## 2017-08-25 ENCOUNTER — Other Ambulatory Visit: Payer: Self-pay | Admitting: Internal Medicine

## 2017-08-25 DIAGNOSIS — I10 Essential (primary) hypertension: Secondary | ICD-10-CM

## 2017-08-26 ENCOUNTER — Other Ambulatory Visit: Payer: Self-pay | Admitting: Internal Medicine

## 2017-08-26 DIAGNOSIS — I10 Essential (primary) hypertension: Secondary | ICD-10-CM

## 2017-08-26 NOTE — Telephone Encounter (Signed)
I believe I just reordered olmesartan-HCTZ for her, which is the same medication class. She should not take both so I will decline this refill.

## 2017-08-27 ENCOUNTER — Telehealth: Payer: Self-pay | Admitting: *Deleted

## 2017-08-27 DIAGNOSIS — I10 Essential (primary) hypertension: Secondary | ICD-10-CM

## 2017-08-27 MED ORDER — HYDROCHLOROTHIAZIDE 25 MG PO TABS: 25.0000 mg | ORAL_TABLET | Freq: Every day | ORAL | 0 refills | Status: DC

## 2017-08-27 MED ORDER — OLMESARTAN MEDOXOMIL 40 MG PO TABS: 40.0000 mg | ORAL_TABLET | Freq: Every day | ORAL | 0 refills | Status: DC

## 2017-08-27 NOTE — Telephone Encounter (Signed)
Call from [facility] - Benicar HCT is on backorder. Please send new rx ; stated med be ordered as 2 separate meds also. Thanks

## 2017-08-27 NOTE — Telephone Encounter (Signed)
New Rx sent. Medications ordered separately at the same total dose.

## 2017-09-09 ENCOUNTER — Other Ambulatory Visit: Payer: Self-pay | Admitting: Internal Medicine

## 2017-09-09 DIAGNOSIS — M545 Low back pain, unspecified: Secondary | ICD-10-CM

## 2017-09-30 ENCOUNTER — Other Ambulatory Visit: Payer: Self-pay | Admitting: Internal Medicine

## 2017-10-03 ENCOUNTER — Ambulatory Visit (INDEPENDENT_AMBULATORY_CARE_PROVIDER_SITE_OTHER): Payer: Medicare Other | Admitting: Internal Medicine

## 2017-10-03 ENCOUNTER — Other Ambulatory Visit: Payer: Self-pay

## 2017-10-03 ENCOUNTER — Encounter: Payer: Self-pay | Admitting: Internal Medicine

## 2017-10-03 VITALS — BP 135/65 | HR 91 | Temp 98.8°F | Ht 63.0 in | Wt 186.7 lb

## 2017-10-03 DIAGNOSIS — D509 Iron deficiency anemia, unspecified: Secondary | ICD-10-CM

## 2017-10-03 DIAGNOSIS — F5104 Psychophysiologic insomnia: Secondary | ICD-10-CM

## 2017-10-03 DIAGNOSIS — D649 Anemia, unspecified: Secondary | ICD-10-CM

## 2017-10-03 DIAGNOSIS — G8929 Other chronic pain: Secondary | ICD-10-CM

## 2017-10-03 DIAGNOSIS — M1712 Unilateral primary osteoarthritis, left knee: Secondary | ICD-10-CM

## 2017-10-03 DIAGNOSIS — M25562 Pain in left knee: Secondary | ICD-10-CM

## 2017-10-03 LAB — GLUCOSE, CAPILLARY: Glucose-Capillary: 120 mg/dL — ABNORMAL HIGH (ref 65–99)

## 2017-10-03 NOTE — Progress Notes (Signed)
CC: Follow up for depressed mood, back pain  HPI:  [name] is a 84 y.o. female with PMHx detailed below presenting for follow-up from March where she was seen for a dysphoric mood, fatigue, and ongoing low back pain.  Since that visit her back pain is largely improved although still bothers her sometimes during activities.  She is now having more trouble with the left knee pain that is chronic associated with known osteoarthritis.  The symptoms are partially improved with the Voltaren gel.  She rates this pain as 3-4 out of 10 without associated falls or impairment of any daily activities such as chores around the house.  See problem based assessment and plan below for additional details.  Psychophysiological insomnia [name] reports a significantly improved mood today compared to her last visit with PHQ-9 score of 7 down from 12. She continues to have somewhat fragmented sleep with 2 episodes of nocturia most nights and also waking to move from a recliner to a bed. She is no longer complaining of as much daily fatigue despite this so I suspect it is not really related to inadequate sleep. Plan: No plan to start SSRI today Recommended good sleep hygiene, voiding before bed time and limiting late night fluids  Chronic anemia She has been taking 325mg  FeSO4 for a history of iron deficiency anemia. This has not been checked since 2013 and appeared replete at that time. Her chronic fatigue is improved and she has a mild normocytic anemia. Plan: Check iron studies and ferritin today Ferritin 981 - will recommend discontinuing iron supplement  Left knee pain She has chronic left knee pain from known osteoarthritis in this joint. It is not severe enough to limit daily activities at this time. There is no effusion or erythema on examination. Plan: She will continue topical voltaren gel PRN. Steroid injection would be an option if pain becomes worse within the next few weeks or  months   Past Medical History:  Diagnosis Date  . Anemia    Normal EGG, colonoscopy, and Sm. Bowel F/T 2006  . Chronic knee pain   . Chronic renal failure   . Degenerative joint disease   . Diabetes mellitus 11/05   type II  . Diabetic peripheral neuropathy (HCC)   . Duodenitis   . Gastritis   . Health maintenance examination    Mammogram 10/11: Possible mass, right breast. No evidence of malignancy in left breast. Needle biopsy of left breast 12/11: No evidence of  carcinoma. Patient needs follow-up right breast diagnostic mammogram with ultrasound in 6 months.  . Hyperlipidemia   . Hypertension   . Osteoporosis 09/2009   T score -2.8  . Vertigo     Review of Systems: Review of Systems  Constitutional: Negative for malaise/fatigue.  Respiratory: Negative for shortness of breath.   Cardiovascular: Negative for chest pain.  Gastrointestinal: Negative for abdominal pain, constipation and diarrhea.  Musculoskeletal: Positive for back pain and joint pain. Negative for falls.  Skin: Positive for rash. Negative for itching.  Neurological: Negative for focal weakness.  Psychiatric/Behavioral: Negative for depression. The patient has insomnia.      Physical Exam: Vitals:   10/03/17 1318  BP: 135/65  Pulse: 91  Temp: 98.8 F (37.1 C)  TempSrc: Oral  SpO2: 99%  Weight: 186 lb 11.2 oz (84.7 kg)  Height: 5\' 3"  (1.6 m)   GENERAL- alert, co-operative, NAD HEENT- Atraumatic, oral mucosa appears moist, approximately 5-50mm large skin tag on back of  scalp under hair CARDIAC- RRR, no murmurs, rubs or gallops. RESP- CTAB, no wheezes or crackles. NEURO- strength upper and lower extremities- 5/5, normal symmetric patellar reflexes, sensation intact globally EXTREMITIES- patellofemoral crepitus bilaterally, good knee ROM, mild midline joint tenderness at left knee without effusion or erythema SKIN- Diffuse seborrheic keratoses across her back PSYCH- Normal mood and affect, appropriate  thought content and speech.   Assessment & Plan:   See encounters tab for problem based medical decision making.   Patient discussed with Dr. Rogelia BogaButcher

## 2017-10-03 NOTE — Patient Instructions (Signed)
It was a pleasure to see you today Wanda Mclaughlin.  We are checking your iron levels today and if these are normal or high I will call you Monday to recommend stopping the daily iron pills.  I recommend trying to limit fluid intake for 2 hours before bed time and trying to urinate before going to sleep to try and reduce the disruptions to your rest overnight.  Your knee pain is consistent with the osteoarthritis. If this gets worse and limits your activities we can try doing an injection. Otherwise we can keep doing the current medicines with topical voltaren gel.

## 2017-10-04 LAB — FERRITIN: FERRITIN: 981 ng/mL — AB (ref 15–150)

## 2017-10-04 LAB — IRON AND TIBC
Iron Saturation: 29 % (ref 15–55)
Iron: 65 ug/dL (ref 27–139)
TIBC: 221 ug/dL — AB (ref 250–450)
UIBC: 156 ug/dL (ref 118–369)

## 2017-10-06 DIAGNOSIS — M25562 Pain in left knee: Secondary | ICD-10-CM

## 2017-10-06 HISTORY — DX: Pain in left knee: M25.562

## 2017-10-06 NOTE — Assessment & Plan Note (Signed)
She has been taking 325mg  FeSO4 for a history of iron deficiency anemia. This has not been checked since 2013 and appeared replete at that time. Her chronic fatigue is improved and she has a mild normocytic anemia. Plan: Check iron studies and ferritin today Ferritin 981 - will recommend discontinuing iron supplement

## 2017-10-06 NOTE — Assessment & Plan Note (Signed)
She has chronic left knee pain from known osteoarthritis in this joint. It is not severe enough to limit daily activities at this time. There is no effusion or erythema on examination. Plan: She will continue topical voltaren gel PRN. Steroid injection would be an option if pain becomes worse within the next few weeks or months

## 2017-10-06 NOTE — Progress Notes (Signed)
Internal Medicine Clinic Attending  Case discussed with Dr. Rice at the time of the visit.  We reviewed the resident's history and exam and pertinent patient test results.  I agree with the assessment, diagnosis, and plan of care documented in the resident's note.  

## 2017-10-06 NOTE — Assessment & Plan Note (Signed)
[name] reports a significantly improved mood today compared to her last visit with PHQ-9 score of 7 down from 12. She continues to have somewhat fragmented sleep with 2 episodes of nocturia most nights and also waking to move from a recliner to a bed. She is no longer complaining of as much daily fatigue despite this so I suspect it is not really related to inadequate sleep. Plan: No plan to start SSRI today Recommended good sleep hygiene, voiding before bed time and limiting late night fluids

## 2017-10-11 ENCOUNTER — Other Ambulatory Visit: Payer: Self-pay | Admitting: Internal Medicine

## 2017-10-11 DIAGNOSIS — M545 Low back pain, unspecified: Secondary | ICD-10-CM

## 2017-10-13 ENCOUNTER — Telehealth: Payer: Self-pay | Admitting: Internal Medicine

## 2017-10-13 NOTE — Telephone Encounter (Signed)
rtc to pt, someone answered then hung up, recalled, same happened

## 2017-10-13 NOTE — Telephone Encounter (Signed)
Patient missed a call, unsure who call. Pls call

## 2017-10-13 NOTE — Telephone Encounter (Signed)
Patient daughter is calling back, please call

## 2017-10-13 NOTE — Telephone Encounter (Addendum)
Attempted to contact pt at both numbers on chart-message left on recorder for call back on home # and female caller stated to leave message on home# (which I have already done) and she will also tell her.    Pt needs to be informed that a visit will be necessary for any further cyclobenzaprine refills.Wanda Mclaughlin, Darlene Cassady6/17/20191:28 PM

## 2017-10-13 NOTE — Telephone Encounter (Signed)
Refill X 1.  This needs to be re-evaluated in the near future given she has taken for low back pain.  [facility] not [facility] for evaluation of continued use of this medication.

## 2017-10-15 NOTE — Telephone Encounter (Signed)
Called again, someone answered and line went dead

## 2017-10-28 ENCOUNTER — Other Ambulatory Visit: Payer: Self-pay | Admitting: Internal Medicine

## 2017-10-28 NOTE — Telephone Encounter (Signed)
Called patient, no answer, called daughter Wanda Mclaughlin who helps with medications, reports Omeprazole is taken as needed.  In my review of chart Omeprazole has not been directly addressed lately, last EGD was 2013. Iron deficency has resolved.  2016 there were discussions by Dr Loma NewtonNgo about stopping Omeprazole.  I discussed with Wanda Mclaughlin that this only needs to be taken as needed.  If she can go without it that would be even better.  They will discuss further with PCP at next visit.

## 2017-10-29 ENCOUNTER — Other Ambulatory Visit: Payer: Self-pay | Admitting: Internal Medicine

## 2017-10-30 ENCOUNTER — Other Ambulatory Visit: Payer: Self-pay | Admitting: Internal Medicine

## 2017-11-07 ENCOUNTER — Other Ambulatory Visit: Payer: Self-pay | Admitting: Internal Medicine

## 2017-11-09 ENCOUNTER — Other Ambulatory Visit: Payer: Self-pay | Admitting: Internal Medicine

## 2017-11-09 DIAGNOSIS — M545 Low back pain, unspecified: Secondary | ICD-10-CM

## 2017-11-19 ENCOUNTER — Encounter: Payer: Self-pay | Admitting: *Deleted

## 2017-11-19 ENCOUNTER — Other Ambulatory Visit: Payer: Self-pay | Admitting: Internal Medicine

## 2017-11-19 DIAGNOSIS — I10 Essential (primary) hypertension: Secondary | ICD-10-CM

## 2017-11-24 ENCOUNTER — Other Ambulatory Visit: Payer: Self-pay | Admitting: Internal Medicine

## 2017-11-24 DIAGNOSIS — M545 Low back pain, unspecified: Secondary | ICD-10-CM

## 2017-11-24 NOTE — Telephone Encounter (Signed)
Would have patient address this on appointment with physician on 8/2.  There is no clear reason on chart for continued use of this medication.  Needs to be discussed with physician prior to refill.   Thanks!

## 2017-11-24 NOTE — Telephone Encounter (Signed)
Relayed info below to patient's daughter. She stated understanding and will relay info to patient. Kinnie FeilL. Ducatte, RN, BSN

## 2017-11-28 ENCOUNTER — Ambulatory Visit: Payer: Self-pay

## 2017-11-28 ENCOUNTER — Ambulatory Visit (INDEPENDENT_AMBULATORY_CARE_PROVIDER_SITE_OTHER): Payer: Medicare Other | Admitting: Internal Medicine

## 2017-11-28 ENCOUNTER — Encounter: Payer: Self-pay | Admitting: Internal Medicine

## 2017-11-28 DIAGNOSIS — E1142 Type 2 diabetes mellitus with diabetic polyneuropathy: Secondary | ICD-10-CM

## 2017-11-28 DIAGNOSIS — M545 Low back pain, unspecified: Secondary | ICD-10-CM

## 2017-11-28 DIAGNOSIS — M62830 Muscle spasm of back: Secondary | ICD-10-CM | POA: Diagnosis not present

## 2017-11-28 DIAGNOSIS — E1122 Type 2 diabetes mellitus with diabetic chronic kidney disease: Secondary | ICD-10-CM

## 2017-11-28 DIAGNOSIS — N183 Chronic kidney disease, stage 3 unspecified: Secondary | ICD-10-CM

## 2017-11-28 DIAGNOSIS — M11862 Other specified crystal arthropathies, left knee: Secondary | ICD-10-CM

## 2017-11-28 DIAGNOSIS — K3184 Gastroparesis: Secondary | ICD-10-CM

## 2017-11-28 DIAGNOSIS — E1143 Type 2 diabetes mellitus with diabetic autonomic (poly)neuropathy: Secondary | ICD-10-CM | POA: Diagnosis not present

## 2017-11-28 DIAGNOSIS — M1180 Other specified crystal arthropathies, unspecified site: Secondary | ICD-10-CM

## 2017-11-28 DIAGNOSIS — I10 Essential (primary) hypertension: Secondary | ICD-10-CM

## 2017-11-28 DIAGNOSIS — Z8639 Personal history of other endocrine, nutritional and metabolic disease: Secondary | ICD-10-CM | POA: Diagnosis not present

## 2017-11-28 DIAGNOSIS — E1140 Type 2 diabetes mellitus with diabetic neuropathy, unspecified: Secondary | ICD-10-CM

## 2017-11-28 DIAGNOSIS — I129 Hypertensive chronic kidney disease with stage 1 through stage 4 chronic kidney disease, or unspecified chronic kidney disease: Secondary | ICD-10-CM

## 2017-11-28 DIAGNOSIS — Z79899 Other long term (current) drug therapy: Secondary | ICD-10-CM

## 2017-11-28 HISTORY — DX: Personal history of other endocrine, nutritional and metabolic disease: Z86.39

## 2017-11-28 LAB — POCT GLYCOSYLATED HEMOGLOBIN (HGB A1C): HEMOGLOBIN A1C: 7 % — AB (ref 4.0–5.6)

## 2017-11-28 LAB — GLUCOSE, CAPILLARY: GLUCOSE-CAPILLARY: 134 mg/dL — AB (ref 70–99)

## 2017-11-28 MED ORDER — CYCLOBENZAPRINE HCL 10 MG PO TABS: 10.0000 mg | ORAL_TABLET | Freq: Three times a day (TID) | ORAL | 1 refills | Status: DC | PRN

## 2017-11-28 NOTE — Progress Notes (Signed)
   CC: Left knee pain, spasm of back muscles.  HPI:  Ms.Maite L Arvilla MarketMills is a 84 y.o. female with a history of left knee pseudogout, muscle cramps, diabetes-well-controlled, CKD, essential hypertension presented with ongoing left knee pain and cramps on her right lower back.  Left Knee pain: she reports of ongoing L. Knee pain that is controlled with Tylenol and Voltaren gel.  She had an intra-articular injection in December 2017.  Spasm of back muscles: She complains of chronic cramps of right lower back that seems to be constant but has been stable with Flexeril.  She denies numbness, tingling or radiculopathy.  I gave her a refill of Flexeril 10 mg today  Essential hypertension: Well-controlled with olmesartan 40 mg, hydrochlorothiazide 20 mg, amlodipine 10 mg.  BP today 134/76  History of diabetic gastroparesis: Per chart review, patient was started on Reglan 5 mg for gastroparesis in 2013.  The medication has been discontinued several times and on further questioning patient reported that she is currently not taking medication and also denies any episodes of nausea or vomiting.  Diabetes mellitus: Well-controlled currently not on any medications.  Last A1c 6.8% on 05/02/2017.  Past Medical History:  Diagnosis Date  . Anemia    Normal EGG, colonoscopy, and Sm. Bowel F/T 2006  . Chronic knee pain   . Chronic renal failure   . Degenerative joint disease   . Diabetes mellitus 11/05   type II  . Diabetic peripheral neuropathy (HCC)   . Duodenitis   . Gastritis   . Health maintenance examination    Mammogram 10/11: Possible mass, right breast. No evidence of malignancy in left breast. Needle biopsy of left breast 12/11: No evidence of  carcinoma. Patient needs follow-up right breast diagnostic mammogram with ultrasound in 6 months.  . Hyperlipidemia   . Hypertension   . Osteoporosis 09/2009   T score -2.8  . Vertigo    Review of Systems: As per HPI  Physical Exam:  Vitals:   11/28/17 0953  BP: 130/76  Pulse: 82  Temp: 99.1 F (37.3 C)  TempSrc: Oral  SpO2: 98%  Weight: 186 lb 8 oz (84.6 kg)  Height: 5\' 3"  (1.6 m)   Constitutional: In no distress, sitting in a wheelchair, appears her stated age, speaks in full sentences Cardiovascular: RRR, no murmurs, gallops, rubs Respiratory: CTA BL, no wheezes, crackles, rhonchi Abdomen: Soft, nondistended, nontender to palpation Extremities: Left knee tender to palpation on the medial side, crepitus in bilateral knee, not swelling or warmth to touch  Assessment & Plan:   See Encounters Tab for problem based charting.  Patient discussed with Dr. Heide SparkNarendra.

## 2017-11-28 NOTE — Progress Notes (Signed)
Internal Medicine Clinic Attending  I saw and evaluated the patient.  I personally confirmed the key portions of the history and exam documented by Dr. Agyei and I reviewed pertinent patient test results.  The assessment, diagnosis, and plan were formulated together and I agree with the documentation in the resident's note.  

## 2017-11-28 NOTE — Assessment & Plan Note (Signed)
Well controlled. Last A1C 6.8% on  05/02/17  Currently not on meds  Will check A1C today

## 2017-11-28 NOTE — Patient Instructions (Signed)
[name],  It was a pleasure taking care of you here at the clinic.  Here is what we addressed today:  1.  Muscle cramps: Refilled your Flexeril and he can pick it up at the pharmacy 2.  Left knee pain: Continue taking Tylenol as needed for the pain and apply the Voltaren gel 3.  Hypertension: You are well controlled and continue your medications. 4.  I have discontinued your aspirin you do not have any history of heart attacks or strokes. 5.  I am checking your A1c.  You follow-up with me in 3 months for blood work and blood pressure check.

## 2017-11-28 NOTE — Assessment & Plan Note (Signed)
Continues to endorse L. Knee pain however stable with tylenol and Voltaren gel 1%.   Tender to palpation medially.

## 2017-11-28 NOTE — Addendum Note (Signed)
Addended by: Yvette RackAGYEI, Alazia Crocket K on: 11/28/2017 03:18 PM   Modules accepted: Level of Service

## 2017-11-28 NOTE — Progress Notes (Signed)
I have made the neccesary changes.  Thank You

## 2017-11-28 NOTE — Assessment & Plan Note (Signed)
Stable with last GFR 34 on 07/14/17.   Will repeat BMP

## 2017-11-28 NOTE — Assessment & Plan Note (Signed)
Well controlled on Olmesartan 40mg , Amlodipine 10mg , HCTZ 25mg   BP today 130/76

## 2017-11-28 NOTE — Assessment & Plan Note (Signed)
History of diabetic gastroparesis and previously on Reglan 5mg .   Per chart review, she has been on medication since 2013 and it has been discontinued several time. She denies nausea or vomiting and reports of not taking medication.

## 2017-11-29 LAB — BMP8+ANION GAP
ANION GAP: 17 mmol/L (ref 10.0–18.0)
BUN/Creatinine Ratio: 14 (ref 12–28)
BUN: 21 mg/dL (ref 8–27)
CALCIUM: 9.4 mg/dL (ref 8.7–10.3)
CO2: 19 mmol/L — ABNORMAL LOW (ref 20–29)
Chloride: 105 mmol/L (ref 96–106)
Creatinine, Ser: 1.49 mg/dL — ABNORMAL HIGH (ref 0.57–1.00)
GFR calc Af Amer: 36 mL/min/{1.73_m2} — ABNORMAL LOW (ref >59)
GFR, EST NON AFRICAN AMERICAN: 32 mL/min/{1.73_m2} — AB (ref >59)
Glucose: 138 mg/dL — ABNORMAL HIGH (ref 65–99)
POTASSIUM: 3.9 mmol/L (ref 3.5–5.2)
Sodium: 141 mmol/L (ref 134–144)

## 2017-12-01 NOTE — Progress Notes (Signed)
I have sent a letter to the patient regarding her lab results.

## 2018-01-16 ENCOUNTER — Other Ambulatory Visit: Payer: Self-pay | Admitting: Internal Medicine

## 2018-01-19 ENCOUNTER — Other Ambulatory Visit: Payer: Self-pay | Admitting: Internal Medicine

## 2018-01-20 ENCOUNTER — Other Ambulatory Visit: Payer: Self-pay | Admitting: *Deleted

## 2018-01-23 ENCOUNTER — Other Ambulatory Visit: Payer: Self-pay | Admitting: Internal Medicine

## 2018-01-27 ENCOUNTER — Other Ambulatory Visit: Payer: Self-pay | Admitting: Oncology

## 2018-01-27 DIAGNOSIS — I10 Essential (primary) hypertension: Secondary | ICD-10-CM

## 2018-01-27 NOTE — Telephone Encounter (Signed)
Next appt scheduled 11/22 with PCP. 

## 2018-01-28 ENCOUNTER — Other Ambulatory Visit: Payer: Self-pay | Admitting: *Deleted

## 2018-01-29 MED ORDER — OMEPRAZOLE 20 MG PO CPDR: 20.0000 mg | DELAYED_RELEASE_CAPSULE | Freq: Every day | ORAL | 0 refills | Status: DC | PRN

## 2018-02-17 DIAGNOSIS — H52223 Regular astigmatism, bilateral: Secondary | ICD-10-CM | POA: Diagnosis not present

## 2018-02-17 DIAGNOSIS — E119 Type 2 diabetes mellitus without complications: Secondary | ICD-10-CM | POA: Diagnosis not present

## 2018-02-21 ENCOUNTER — Other Ambulatory Visit: Payer: Self-pay | Admitting: Internal Medicine

## 2018-03-16 DIAGNOSIS — Z961 Presence of intraocular lens: Secondary | ICD-10-CM | POA: Diagnosis not present

## 2018-03-16 DIAGNOSIS — H26492 Other secondary cataract, left eye: Secondary | ICD-10-CM | POA: Diagnosis not present

## 2018-03-19 ENCOUNTER — Other Ambulatory Visit: Payer: Self-pay | Admitting: Internal Medicine

## 2018-03-20 ENCOUNTER — Encounter (INDEPENDENT_AMBULATORY_CARE_PROVIDER_SITE_OTHER): Payer: Self-pay

## 2018-03-20 ENCOUNTER — Ambulatory Visit (INDEPENDENT_AMBULATORY_CARE_PROVIDER_SITE_OTHER): Payer: Medicare Other | Admitting: Internal Medicine

## 2018-03-20 ENCOUNTER — Encounter: Payer: Self-pay | Admitting: Internal Medicine

## 2018-03-20 VITALS — BP 134/58 | HR 93 | Temp 98.8°F | Ht 63.0 in | Wt 181.4 lb

## 2018-03-20 DIAGNOSIS — E1122 Type 2 diabetes mellitus with diabetic chronic kidney disease: Secondary | ICD-10-CM

## 2018-03-20 DIAGNOSIS — N183 Chronic kidney disease, stage 3 unspecified: Secondary | ICD-10-CM

## 2018-03-20 DIAGNOSIS — I10 Essential (primary) hypertension: Secondary | ICD-10-CM

## 2018-03-20 DIAGNOSIS — Z79899 Other long term (current) drug therapy: Secondary | ICD-10-CM

## 2018-03-20 DIAGNOSIS — I129 Hypertensive chronic kidney disease with stage 1 through stage 4 chronic kidney disease, or unspecified chronic kidney disease: Secondary | ICD-10-CM | POA: Diagnosis not present

## 2018-03-20 DIAGNOSIS — E1140 Type 2 diabetes mellitus with diabetic neuropathy, unspecified: Secondary | ICD-10-CM

## 2018-03-20 DIAGNOSIS — M11862 Other specified crystal arthropathies, left knee: Secondary | ICD-10-CM

## 2018-03-20 DIAGNOSIS — M62830 Muscle spasm of back: Secondary | ICD-10-CM

## 2018-03-20 DIAGNOSIS — M1180 Other specified crystal arthropathies, unspecified site: Secondary | ICD-10-CM

## 2018-03-20 DIAGNOSIS — Z0000 Encounter for general adult medical examination without abnormal findings: Secondary | ICD-10-CM

## 2018-03-20 DIAGNOSIS — J069 Acute upper respiratory infection, unspecified: Secondary | ICD-10-CM

## 2018-03-20 MED ORDER — AMLODIPINE BESYLATE 10 MG PO TABS: 10.0000 mg | ORAL_TABLET | Freq: Every day | ORAL | 3 refills | Status: DC

## 2018-03-20 MED ORDER — CYCLOBENZAPRINE HCL 10 MG PO TABS: 10.0000 mg | ORAL_TABLET | Freq: Three times a day (TID) | ORAL | 1 refills | Status: DC | PRN

## 2018-03-20 MED ORDER — OLMESARTAN MEDOXOMIL-HCTZ 40-25 MG PO TABS: 1.0000 | ORAL_TABLET | Freq: Every day | ORAL | 3 refills | Status: DC

## 2018-03-20 MED ORDER — VOLTAREN 1 % TD GEL: 4.0000 g | Freq: Four times a day (QID) | TRANSDERMAL | 3 refills | Status: DC

## 2018-03-20 NOTE — Assessment & Plan Note (Signed)
Chronic kidney disease stage III: Stable CKD 3.  Plan: - Continue to monitor -We will recheck BMP in 3 months at follow-up

## 2018-03-20 NOTE — Progress Notes (Signed)
   CC: Follow-up hypertension  HPI:  Wanda Mclaughlin is a 84 y.o. very pleasant woman with medical history as listed below presenting for follow-up of hypertension.  Patient was last seen by me in August 2019 and reports she has been doing well ever since.  Today her only complaint is his upper respiratory viral symptoms as she endorses rhinorrhea, chest congestion, nonproductive cough which has somewhat improved with mucinex. She denies fevers, chills, shortness of breath, myalgia.  She states that her left knee pain has significantly improved with the Voltaren gel.  See problem based charting for further details:   Past Medical History:  Diagnosis Date  . Anemia    Normal EGG, colonoscopy, and Sm. Bowel F/T 2006  . Chronic knee pain   . Chronic renal failure   . Degenerative joint disease   . Diabetes mellitus 11/05   type II  . Diabetic peripheral neuropathy (HCC)   . Duodenitis   . Gastritis   . Health maintenance examination    Mammogram 10/11: Possible mass, right breast. No evidence of malignancy in left breast. Needle biopsy of left breast 12/11: No evidence of  carcinoma. Patient needs follow-up right breast diagnostic mammogram with ultrasound in 6 months.  . History of diabetic gastroparesis 11/28/2017  . Hyperlipidemia   . Hypertension   . Left knee pain 10/06/2017  . Osteoporosis 09/2009   T score -2.8  . Vertigo    Review of Systems: As per HPI  Physical Exam:  Vitals:   03/20/18 1401  BP: (!) 160/76  Pulse: 93  Temp: 98.8 F (37.1 C)  TempSrc: Oral  SpO2: 98%  Weight: 181 lb 6.4 oz (82.3 kg)  Height: 5\' 3"  (1.6 m)   Physical Exam  Constitutional: She is well-developed, well-nourished, and in no distress.  HENT:  Head: Normocephalic and atraumatic.  Eyes: Conjunctivae are normal.  Neck: Neck supple.  Cardiovascular: Normal rate, regular rhythm and normal heart sounds. Exam reveals no gallop and no friction rub.  No murmur heard. Pulmonary/Chest:  Effort normal and breath sounds normal. No respiratory distress. She has no wheezes. She has no rales.  Abdominal: Soft. Bowel sounds are normal. She exhibits no distension. There is no tenderness. There is no rebound.  Musculoskeletal: Normal range of motion. She exhibits no edema or tenderness.  Dry skin at the lower extremities.  Psychiatric: Mood and affect normal.    Assessment & Plan:   See Encounters Tab for problem based charting.  Patient discussed with Dr. Oswaldo DoneVincent

## 2018-03-20 NOTE — Assessment & Plan Note (Signed)
Hypertension: Blood pressure is well controlled however today it was initially elevated at 164/76 with a repeat of 134/58.  Patient is currently asymptomatic and denies chest pain, shortness of breath, dizziness, lightheadedness.  BP Readings from Last 3 Encounters:  03/20/18 (!) 134/58  11/28/17 130/76  10/03/17 135/65   Plan: -Continue olmesartan- HCTZ 40 mg - 25 mg daily -Continue to monitor BP

## 2018-03-20 NOTE — Assessment & Plan Note (Signed)
Type 2 diabetes mellitus: Diet controlled.  Last A1c measured at 7% (6.8% in January 2019)  Plan: -Continue to monitor

## 2018-03-20 NOTE — Assessment & Plan Note (Signed)
Left knee pain: 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  Reports significant improvement to her left knee pain with the Voltaren gel.  She is able to complete her activities of daily living.  Plan: - Continue Voltaren gel

## 2018-03-20 NOTE — Patient Instructions (Signed)
[name],  It is always a pleasure taking care of you here at the clinic.  Sorry about the confusion with your blood pressure medications.  I have made a medication reconciliation and here is my recommendation for today's visit.  1.  Take olmesartan- hydrochlorothiazide 40-25 mg once daily 2.  Take amlodipine 10 mg once daily  I have given you a refill of the Voltaren gel for you knee pain and also the Flexeril for your back spasm.  For the runny nose and chest congestion, these are symptoms of flu and I encourage you to continue taking the Mucinex and you should improve in the next 7 to 10 days.  Dr. Dortha SchwalbeAgyei

## 2018-03-20 NOTE — Progress Notes (Signed)
Internal Medicine Clinic Attending  I saw and evaluated the patient.  I personally confirmed the key portions of the history and exam documented by Dr. Agyei and I reviewed pertinent patient test results.  The assessment, diagnosis, and plan were formulated together and I agree with the documentation in the resident's note.  

## 2018-03-20 NOTE — Assessment & Plan Note (Signed)
Healthcare maintenance: -Flu vaccine up-to-date -Normal foot exam today

## 2018-03-25 ENCOUNTER — Other Ambulatory Visit: Payer: Self-pay

## 2018-03-25 NOTE — Patient Outreach (Signed)
[facility] Care Management  03/25/2018  Dan HumphreysHazel L Mclaughlin 1928/11/18 [id number]   Medication Adherence call to Mrs. Claudie LeachHazel Mclaughlin spoke with patient's daughter patient is no longer taking Amlodipine/ Atorvastatin 10/10 mg she is now taking Amlodipine 10 mg and patient pick up from [facility] 01/2018 for a 90 days supply. Wanda Mclaughlin is showing past due under [facility] [employer].   Lillia AbedAna Mclaughlin CPhT Pharmacy Technician [facility] Care Management Direct Dial [phone]  Fax [phone] [email or web address]

## 2018-03-30 DIAGNOSIS — Z9849 Cataract extraction status, unspecified eye: Secondary | ICD-10-CM | POA: Diagnosis not present

## 2018-04-14 ENCOUNTER — Other Ambulatory Visit: Payer: Self-pay | Admitting: Internal Medicine

## 2018-04-15 ENCOUNTER — Other Ambulatory Visit: Payer: Self-pay | Admitting: Internal Medicine

## 2018-04-15 DIAGNOSIS — E1140 Type 2 diabetes mellitus with diabetic neuropathy, unspecified: Secondary | ICD-10-CM

## 2018-04-15 MED ORDER — GABAPENTIN 400 MG PO CAPS: 400.0000 mg | ORAL_CAPSULE | Freq: Two times a day (BID) | ORAL | 1 refills | Status: DC

## 2018-04-21 ENCOUNTER — Other Ambulatory Visit: Payer: Self-pay | Admitting: Internal Medicine

## 2018-04-21 DIAGNOSIS — I10 Essential (primary) hypertension: Secondary | ICD-10-CM

## 2018-04-28 ENCOUNTER — Other Ambulatory Visit: Payer: Self-pay | Admitting: Internal Medicine

## 2018-05-26 ENCOUNTER — Other Ambulatory Visit: Payer: Self-pay | Admitting: Internal Medicine

## 2018-05-26 DIAGNOSIS — I10 Essential (primary) hypertension: Secondary | ICD-10-CM

## 2018-06-09 ENCOUNTER — Other Ambulatory Visit: Payer: Self-pay | Admitting: Internal Medicine

## 2018-07-02 ENCOUNTER — Other Ambulatory Visit: Payer: Self-pay | Admitting: Internal Medicine

## 2018-07-02 DIAGNOSIS — I10 Essential (primary) hypertension: Secondary | ICD-10-CM

## 2018-07-02 NOTE — Telephone Encounter (Signed)
Not on current med list . But @ [facility] 03/20/18  " Take amlodipine 10 mg once daily" per Dr Dortha Schwalbe. Thanks

## 2018-07-02 NOTE — Telephone Encounter (Signed)
Dr. Verdell Face note from 03/20/2018 is not clear.  Under the essential hypertension heading he does not mention the amlodipine but the other combo pill that he wished to be continued.  In the AVS he does mention amlodipine is to be taken, but he discontinued the prescription.  I will not renew this medication at this time and asked that Dr. Antonieta Pert renew it upon his return in a few days if he does want to continue the amlodipine.  This should clarify his intent for therapy.

## 2018-07-06 ENCOUNTER — Other Ambulatory Visit: Payer: Self-pay | Admitting: Internal Medicine

## 2018-07-06 DIAGNOSIS — I10 Essential (primary) hypertension: Secondary | ICD-10-CM

## 2018-07-06 MED ORDER — AMLODIPINE BESYLATE 10 MG PO TABS: 10.0000 mg | ORAL_TABLET | Freq: Every day | ORAL | 3 refills | Status: DC

## 2018-07-28 ENCOUNTER — Other Ambulatory Visit: Payer: Self-pay | Admitting: Internal Medicine

## 2018-08-14 ENCOUNTER — Encounter: Payer: Self-pay | Admitting: Internal Medicine

## 2018-08-20 ENCOUNTER — Ambulatory Visit (INDEPENDENT_AMBULATORY_CARE_PROVIDER_SITE_OTHER): Payer: Medicare Other | Admitting: Internal Medicine

## 2018-08-20 ENCOUNTER — Other Ambulatory Visit: Payer: Self-pay

## 2018-08-20 ENCOUNTER — Encounter: Payer: Self-pay | Admitting: Internal Medicine

## 2018-08-20 DIAGNOSIS — M1180 Other specified crystal arthropathies, unspecified site: Secondary | ICD-10-CM | POA: Diagnosis not present

## 2018-08-20 DIAGNOSIS — M62830 Muscle spasm of back: Secondary | ICD-10-CM | POA: Diagnosis not present

## 2018-08-20 NOTE — Assessment & Plan Note (Signed)
Chronic back pain due to muscle spasm: He has been suffering from intermittent low back pain which is thought to be musculoskeletal in nature.  She has been on Flexeril however is running low on medications.  She denies any acute pain presently, urinary or bowel incontinence.  She requests refill of Flexeril.  Plan: -Continue Flexeril -Continue daily activities

## 2018-08-20 NOTE — Assessment & Plan Note (Signed)
Chronic left knee pain: Continues to endorse left knee pain but is however able to complete her activities of daily living.  She usually goes for a walk with her daughter Adela Lank) and although the pain is prevalent, it really does not prevent her from doing activities.  She uses topical Voltaren gel and Tylenol which provides minimal relief.   Plan: -Continue topical Voltaren gel -Continue Tylenol prn -Continue daily exercises with daughter -Will abstain from NSAIDs given history of chronic kidney disease.

## 2018-08-20 NOTE — Progress Notes (Signed)
  [facility] Internal Medicine Residency Telephone Encounter Continuity Care Appointment  HPI:   This telephone encounter was created for Wanda Mclaughlin on 08/20/2018 for the following purpose/cc: Continuity visit/follow-up diabetes mellitus.   [name] is an 85 year old very pleasant woman with type 2 diabetes mellitus- diet controlled, chronic lower back pain 2/2 muscle spasm, diabetic gastroparesis home I called to follow-up on chronic medical problems.  In regards to her history of diabetic gastroparesis, she presently does not report of nausea or vomiting but did request if it was possible to resume her metoclopramide.  Given her age and the risks of polypharmacy and also as she remains asymptomatic, I advised not to refill medication now.  In addition, it has been discontinued several times in the past.  Please see problem based charting for further details.   Past Medical History:  Past Medical History:  Diagnosis Date  . Anemia    Normal EGG, colonoscopy, and Sm. Bowel F/T 2006  . Bilateral hand pain 07/09/2016  . Chronic knee pain   . Chronic renal failure   . Degenerative joint disease   . Diabetes mellitus 11/05   type II  . Diabetic peripheral neuropathy (HCC)   . Duodenitis   . Gastritis   . Health maintenance examination    Mammogram 10/11: Possible mass, right breast. No evidence of malignancy in left breast. Needle biopsy of left breast 12/11: No evidence of  carcinoma. Patient needs follow-up right breast diagnostic mammogram with ultrasound in 6 months.  . History of diabetic gastroparesis 11/28/2017  . Hyperlipidemia   . Hypertension   . Left knee pain 10/06/2017  . Osteoporosis 09/2009   T score -2.8  . Psychophysiological insomnia 11/15/2016  . Vertigo       ROS:  Review of Systems  Musculoskeletal: Positive for back pain (Lower back pain (chronic-right side)) and joint pain (Left knee pain).     Assessment / Plan / Recommendations:   Please see A&P  under problem oriented charting for assessment of the patient's acute and chronic medical conditions.   As always, pt is advised that if symptoms worsen or new symptoms arise, they should go to an urgent care facility or to to ER for further evaluation.   Consent and Medical Decision Making:   Patient discussed with Dr. Heide Spark  This is a telephone encounter between Dan Mclaughlin and Yvette Rack on 08/20/2018 for follow-up on chronic medical problems. The visit was conducted with the patient located at home and Yvette Rack at [facility]. The patient's identity was confirmed using their DOB and current address. The patient has consented to being evaluated through a telephone encounter and understands the associated risks (an examination cannot be done and the patient may need to come in for an appointment) / benefits (allows the patient to remain at home, decreasing exposure to coronavirus). I personally spent 10 minutes on medical discussion.

## 2018-08-21 NOTE — Progress Notes (Signed)
Internal Medicine Clinic Attending  Case discussed with Dr. Agyei at the time of the visit.  We reviewed the resident's history and exam and pertinent patient test results.  I agree with the assessment, diagnosis, and plan of care documented in the resident's note.    

## 2018-09-30 ENCOUNTER — Other Ambulatory Visit: Payer: Self-pay | Admitting: Internal Medicine

## 2018-09-30 DIAGNOSIS — M62830 Muscle spasm of back: Secondary | ICD-10-CM

## 2018-09-30 MED ORDER — CYCLOBENZAPRINE HCL 10 MG PO TABS: 10.0000 mg | ORAL_TABLET | Freq: Three times a day (TID) | ORAL | 1 refills | Status: DC | PRN

## 2018-09-30 NOTE — Telephone Encounter (Signed)
Refill Request   cyclobenzaprine (FLEXERIL) 10 MG tablet  [id number] - [place], [place] - [address]

## 2018-10-05 ENCOUNTER — Other Ambulatory Visit: Payer: Self-pay | Admitting: Internal Medicine

## 2018-10-05 DIAGNOSIS — I10 Essential (primary) hypertension: Secondary | ICD-10-CM

## 2018-10-22 ENCOUNTER — Other Ambulatory Visit: Payer: Self-pay | Admitting: Internal Medicine

## 2018-11-02 ENCOUNTER — Other Ambulatory Visit: Payer: Self-pay | Admitting: *Deleted

## 2018-11-02 DIAGNOSIS — E1140 Type 2 diabetes mellitus with diabetic neuropathy, unspecified: Secondary | ICD-10-CM

## 2018-11-02 MED ORDER — GABAPENTIN 400 MG PO CAPS: 400.0000 mg | ORAL_CAPSULE | Freq: Two times a day (BID) | ORAL | 1 refills | Status: DC

## 2018-11-11 ENCOUNTER — Other Ambulatory Visit: Payer: Self-pay

## 2018-11-11 ENCOUNTER — Encounter: Payer: Self-pay | Admitting: Internal Medicine

## 2018-11-11 ENCOUNTER — Ambulatory Visit (INDEPENDENT_AMBULATORY_CARE_PROVIDER_SITE_OTHER): Payer: Medicare Other | Admitting: Internal Medicine

## 2018-11-11 VITALS — BP 171/80 | HR 92 | Temp 98.8°F | Ht 63.0 in | Wt 179.6 lb

## 2018-11-11 DIAGNOSIS — Z0000 Encounter for general adult medical examination without abnormal findings: Secondary | ICD-10-CM

## 2018-11-11 DIAGNOSIS — H9110 Presbycusis, unspecified ear: Secondary | ICD-10-CM

## 2018-11-11 DIAGNOSIS — I10 Essential (primary) hypertension: Secondary | ICD-10-CM

## 2018-11-11 DIAGNOSIS — M62838 Other muscle spasm: Secondary | ICD-10-CM

## 2018-11-11 DIAGNOSIS — E1140 Type 2 diabetes mellitus with diabetic neuropathy, unspecified: Secondary | ICD-10-CM | POA: Diagnosis not present

## 2018-11-11 DIAGNOSIS — M62830 Muscle spasm of back: Secondary | ICD-10-CM

## 2018-11-11 DIAGNOSIS — R011 Cardiac murmur, unspecified: Secondary | ICD-10-CM | POA: Diagnosis not present

## 2018-11-11 DIAGNOSIS — Z79899 Other long term (current) drug therapy: Secondary | ICD-10-CM

## 2018-11-11 LAB — POCT GLYCOSYLATED HEMOGLOBIN (HGB A1C): Hemoglobin A1C: 7.4 % — AB (ref 4.0–5.6)

## 2018-11-11 LAB — GLUCOSE, CAPILLARY: Glucose-Capillary: 158 mg/dL — ABNORMAL HIGH (ref 70–99)

## 2018-11-11 MED ORDER — CYCLOBENZAPRINE HCL 10 MG PO TABS: 10.0000 mg | ORAL_TABLET | Freq: Three times a day (TID) | ORAL | 1 refills | Status: DC | PRN

## 2018-11-11 MED ORDER — GABAPENTIN 400 MG PO CAPS: 400.0000 mg | ORAL_CAPSULE | Freq: Two times a day (BID) | ORAL | 1 refills | Status: DC

## 2018-11-11 MED ORDER — OMEPRAZOLE 20 MG PO CPDR: 20.0000 mg | DELAYED_RELEASE_CAPSULE | Freq: Every day | ORAL | 0 refills | Status: DC | PRN

## 2018-11-11 MED ORDER — OLMESARTAN MEDOXOMIL-HCTZ 40-25 MG PO TABS: 1.0000 | ORAL_TABLET | Freq: Every day | ORAL | 3 refills | Status: DC

## 2018-11-11 NOTE — Patient Instructions (Signed)
Wanda Mclaughlin,   It was a pleasure taking care of your the clinic today.  Overall, he seems that you are doing very well and I will continue just doing exactly what you have been doing.  For your muscle spasm, I have refilled the muscle relaxant and gabapentin.  Take Care! Dr. Eileen Stanford  Please call the internal medicine center clinic if you have any questions or concerns, we may be able to help and keep you from a long and expensive emergency room wait. Our clinic and after hours phone number is [phone], the best time to call is Monday through Friday 9 am to 4 pm but there is always someone available 24/7 if you have an emergency. If you need medication refills please notify your pharmacy one week in advance and they will send [place] a request.

## 2018-11-11 NOTE — Assessment & Plan Note (Signed)
Type 2 diabetes mellitus: Previously not on any medication.  A1c was 7% in August 2019.  Today A1c 7.4%.  My goal A1c is less than 8.  Plan: -Continue to monitor -If A1c greater than 8 in the next few months, can start metformin.

## 2018-11-11 NOTE — Progress Notes (Signed)
   CC: Follow-up hypertension  HPI:  Wanda Mclaughlin is a 85 y.o. with medical history listed below presenting to follow-up on chronic medical pains.  Please see problem based charting for further details.  Past Medical History:  Diagnosis Date  . Anemia    Normal EGG, colonoscopy, and Sm. Bowel F/T 2006  . Bilateral hand pain 07/09/2016  . Chronic knee pain   . Chronic renal failure   . Degenerative joint disease   . Diabetes mellitus 11/05   type II  . Diabetic peripheral neuropathy ([place])   . Duodenitis   . Gastritis   . Health maintenance examination    Mammogram 10/11: Possible mass, right breast. No evidence of malignancy in left breast. Needle biopsy of left breast 12/11: No evidence of  carcinoma. Patient needs follow-up right breast diagnostic mammogram with ultrasound in 6 months.  . History of diabetic gastroparesis 11/28/2017  . Hyperlipidemia   . Hypertension   . Left knee pain 10/06/2017  . Osteoporosis 09/2009   T score -2.8  . Psychophysiological insomnia 11/15/2016  . Vertigo    Review of Systems: As per HPI  Physical Exam:  Vitals:   11/11/18 1016  BP: (!) 171/80  Pulse: 92  Temp: 98.8 F (37.1 C)  TempSrc: Oral  SpO2: 95%  Weight: 179 lb 9.6 oz (81.5 kg)  Height: 5\' 3"  (1.6 m)   Physical Exam Vitals signs and nursing note reviewed.  Constitutional:      Comments: She is hard of hearing due to presbycusis  Cardiovascular:     Rate and Rhythm: Normal rate.     Comments: Soft systolic murmur at the right second intercostal space Pulmonary:     Breath sounds: No wheezing, rhonchi or rales.  Musculoskeletal:     Right lower leg: No edema.     Left lower leg: No edema.  Skin:    General: Skin is warm.  Neurological:     Mental Status: She is alert.     Assessment & Plan:   See Encounters Tab for problem based charting.  Patient discussed with Dr. Dareen Piano

## 2018-11-11 NOTE — Assessment & Plan Note (Signed)
Hypertension: Blood pressure elevated today at 171/80 with pulse of 92.  She is completely asymptomatic.  She states that she monitors her blood pressure at home with assistance of her daughter and systolic blood pressures usually in the 130s.  Per chart review, her blood pressure has been between 130-160/50-70.  She did report feeling stressed due to multiple family members including the Niece.  Goal blood pressures <140/90  BP Readings from Last 3 Encounters:  11/11/18 (!) 171/80  03/20/18 (!) 134/58  11/28/17 130/76    Plan: -Continue olmesartan-HCTZ 40-25 mg daily -Advised to keep BP log twice daily for 1 month and follow-up at the clinic -If BP continues to be elevated can add amlodipine.

## 2018-11-12 ENCOUNTER — Telehealth: Payer: Self-pay | Admitting: Internal Medicine

## 2018-11-12 LAB — BMP8+ANION GAP
Anion Gap: 19 mmol/L — ABNORMAL HIGH (ref 10.0–18.0)
BUN/Creatinine Ratio: 15 (ref 12–28)
BUN: 17 mg/dL (ref 8–27)
CO2: 20 mmol/L (ref 20–29)
Calcium: 9.1 mg/dL (ref 8.7–10.3)
Chloride: 102 mmol/L (ref 96–106)
Creatinine, Ser: 1.15 mg/dL — ABNORMAL HIGH (ref 0.57–1.00)
GFR calc Af Amer: 49 mL/min/{1.73_m2} — ABNORMAL LOW (ref >59)
GFR calc non Af Amer: 43 mL/min/{1.73_m2} — ABNORMAL LOW (ref >59)
Glucose: 155 mg/dL — ABNORMAL HIGH (ref 65–99)
Potassium: 3.4 mmol/L — ABNORMAL LOW (ref 3.5–5.2)
Sodium: 141 mmol/L (ref 134–144)

## 2018-11-12 NOTE — Telephone Encounter (Signed)
Called MS. Porath to discuss lab results. Overall, renal function has improved. She did have mild hypokalemia of 3.4. She is already on an ARB so I advised her to eat about 1-2 banana's a day for a couple days. She expressed understanding.

## 2018-11-12 NOTE — Progress Notes (Signed)
Internal Medicine Clinic Attending  Case discussed with Dr. Agyei at the time of the visit.  We reviewed the resident's history and exam and pertinent patient test results.  I agree with the assessment, diagnosis, and plan of care documented in the resident's note.    

## 2018-12-16 ENCOUNTER — Other Ambulatory Visit: Payer: Self-pay

## 2018-12-16 ENCOUNTER — Ambulatory Visit (INDEPENDENT_AMBULATORY_CARE_PROVIDER_SITE_OTHER): Payer: Medicare Other | Admitting: Internal Medicine

## 2018-12-16 ENCOUNTER — Encounter: Payer: Self-pay | Admitting: Internal Medicine

## 2018-12-16 VITALS — BP 172/85 | HR 79 | Temp 98.2°F | Ht 63.0 in | Wt 178.2 lb

## 2018-12-16 DIAGNOSIS — D649 Anemia, unspecified: Secondary | ICD-10-CM

## 2018-12-16 DIAGNOSIS — M11862 Other specified crystal arthropathies, left knee: Secondary | ICD-10-CM | POA: Diagnosis not present

## 2018-12-16 DIAGNOSIS — G8929 Other chronic pain: Secondary | ICD-10-CM | POA: Diagnosis not present

## 2018-12-16 DIAGNOSIS — Z79899 Other long term (current) drug therapy: Secondary | ICD-10-CM

## 2018-12-16 DIAGNOSIS — M1180 Other specified crystal arthropathies, unspecified site: Secondary | ICD-10-CM

## 2018-12-16 DIAGNOSIS — E1140 Type 2 diabetes mellitus with diabetic neuropathy, unspecified: Secondary | ICD-10-CM

## 2018-12-16 DIAGNOSIS — I10 Essential (primary) hypertension: Secondary | ICD-10-CM

## 2018-12-16 NOTE — Patient Instructions (Signed)
Wanda Mclaughlin,  It was a pleasure taking care of your the clinic today.  Based on your blood pressure readings at home which have been very well I am not going to make any changes to your blood pressure medicine today and I would encourage you to continue doing exactly what you have been doing eating well, cutting back salt in your diet and trying to walk as much as you can.  Take care! Dr. Eileen Stanford  Please call the internal medicine center clinic if you have any questions or concerns, we may be able to help and keep you from a long and expensive emergency room wait. Our clinic and after hours phone number is [phone], the best time to call is Monday through Friday 9 am to 4 pm but there is always someone available 24/7 if you have an emergency. If you need medication refills please notify your pharmacy one week in advance and they will send [place] a request.

## 2018-12-16 NOTE — Assessment & Plan Note (Signed)
Chronic anemia: She was found to have a low hemoglobin of 11.5 on a CBC in 2015 which has ranged from 11-13 since then.  Recently in 2019 hemoglobin 10.8.  Her saturation level was noted to be low in 2013 however ferritin was elevated which was most likely due to her continued use of iron supplements.  Her iron supplement has been discontinued since June 2019 due to highly elevated ferritin of 981.  Today, she remains asymptomatic.  Plan: -Follow-up CBC, iron studies and ferritin.

## 2018-12-16 NOTE — Assessment & Plan Note (Signed)
Hypertension: I personally evaluated Mrs. Wanda Mclaughlin several weeks ago and she was noted to have an elevated blood pressure in the 170s/80s.  At that time, she reported of feeling stressed due to death of several family members.  I advised her to keep a log of her blood pressure twice daily and she did so.  From July 16 to August 19 her blood pressure range has been in the 130s-140s/70s-90s.  But occasionally in the 160s/90s.   My goal blood pressure for her is<150/90.  With this, I would not make any changes to her medication.  Plan: -Continue olmesartan-HCTZ 40-25 mg daily -Follow-up BMP

## 2018-12-16 NOTE — Progress Notes (Signed)
   CC: Follow-up hypertension  HPI:  Wanda Mclaughlin is a 85 y.o. with medical history listed below presenting to follow-up on hypertension.  Please see problem based charting for further details.  Past Medical History:  Diagnosis Date  . Anemia    Normal EGG, colonoscopy, and Sm. Bowel F/T 2006  . Bilateral hand pain 07/09/2016  . Chronic knee pain   . Chronic renal failure   . Degenerative joint disease   . Diabetes mellitus 11/05   type II  . Diabetic peripheral neuropathy ([place])   . Duodenitis   . Gastritis   . Health maintenance examination    Mammogram 10/11: Possible mass, right breast. No evidence of malignancy in left breast. Needle biopsy of left breast 12/11: No evidence of  carcinoma. Patient needs follow-up right breast diagnostic mammogram with ultrasound in 6 months.  . History of diabetic gastroparesis 11/28/2017  . Hyperlipidemia   . Hypertension   . Left knee pain 10/06/2017  . Osteoporosis 09/2009   T score -2.8  . Psychophysiological insomnia 11/15/2016  . Vertigo    Review of Systems: As per HPI  Physical Exam:  Vitals:   12/16/18 1020  BP: (!) 184/90  Pulse: 84  Temp: 98.2 F (36.8 C)  TempSrc: Oral  SpO2: 100%  Weight: 178 lb 3.2 oz (80.8 kg)  Height: 5\' 3"  (1.6 m)   Physical Exam  Constitutional: She is well-developed, well-nourished, and in no distress.  HENT:  Head: Normocephalic and atraumatic.  Cardiovascular: Normal rate, regular rhythm and normal heart sounds.  No murmur heard. Pulmonary/Chest: Effort normal and breath sounds normal. She has no wheezes. She has no rales.  Musculoskeletal: Normal range of motion.        General: No tenderness.     Comments: Crepitus on flexion and extension of L. knee    Assessment & Plan:   See Encounters Tab for problem based charting.  Patient discussed with Dr. Lynnae January

## 2018-12-16 NOTE — Assessment & Plan Note (Signed)
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  She states that over-the-counter analgesics do not provide her any relief.  She ambulates with a cane.  Plan: - Advised to present to [facility] on August 27 afternoon for evaluation of steroid injection

## 2018-12-17 LAB — BMP8+ANION GAP
Anion Gap: 20 mmol/L — ABNORMAL HIGH (ref 10.0–18.0)
BUN/Creatinine Ratio: 19 (ref 12–28)
BUN: 27 mg/dL (ref 8–27)
CO2: 20 mmol/L (ref 20–29)
Calcium: 9.7 mg/dL (ref 8.7–10.3)
Chloride: 100 mmol/L (ref 96–106)
Creatinine, Ser: 1.45 mg/dL — ABNORMAL HIGH (ref 0.57–1.00)
GFR calc Af Amer: 37 mL/min/{1.73_m2} — ABNORMAL LOW (ref >59)
GFR calc non Af Amer: 32 mL/min/{1.73_m2} — ABNORMAL LOW (ref >59)
Glucose: 149 mg/dL — ABNORMAL HIGH (ref 65–99)
Potassium: 4 mmol/L (ref 3.5–5.2)
Sodium: 140 mmol/L (ref 134–144)

## 2018-12-17 LAB — CBC
Hematocrit: 35.7 % (ref 34.0–46.6)
Hemoglobin: 11.4 g/dL (ref 11.1–15.9)
MCH: 31.1 pg (ref 26.6–33.0)
MCHC: 31.9 g/dL (ref 31.5–35.7)
MCV: 98 fL — ABNORMAL HIGH (ref 79–97)
Platelets: 226 10*3/uL (ref 150–450)
RBC: 3.66 x10E6/uL — ABNORMAL LOW (ref 3.77–5.28)
RDW: 13.8 % (ref 11.7–15.4)
WBC: 5.5 10*3/uL (ref 3.4–10.8)

## 2018-12-17 LAB — VITAMIN B12: Vitamin B-12: 684 pg/mL (ref 232–1245)

## 2018-12-18 NOTE — Progress Notes (Signed)
Internal Medicine Clinic Attending  Case discussed with Dr. Agyei at the time of the visit.  We reviewed the resident's history and exam and pertinent patient test results.  I agree with the assessment, diagnosis, and plan of care documented in the resident's note.    

## 2018-12-21 ENCOUNTER — Telehealth: Payer: Self-pay | Admitting: Internal Medicine

## 2018-12-21 NOTE — Telephone Encounter (Signed)
I was able to reach out to Wanda Mclaughlin to discuss her laboratory results.  BMP showed normalizing of serum potassium.  Serum creatinine is at her baseline.  She expressed understanding.

## 2018-12-24 ENCOUNTER — Ambulatory Visit (INDEPENDENT_AMBULATORY_CARE_PROVIDER_SITE_OTHER): Payer: Medicare Other | Admitting: Internal Medicine

## 2018-12-24 ENCOUNTER — Other Ambulatory Visit: Payer: Self-pay

## 2018-12-24 ENCOUNTER — Encounter: Payer: Self-pay | Admitting: Internal Medicine

## 2018-12-24 DIAGNOSIS — M1180 Other specified crystal arthropathies, unspecified site: Secondary | ICD-10-CM

## 2018-12-24 DIAGNOSIS — M11262 Other chondrocalcinosis, left knee: Secondary | ICD-10-CM

## 2018-12-24 NOTE — Patient Instructions (Signed)
Wanda Mclaughlin,   Today, we injected some pain medicine in your left knee and hopefully you should get some relief.  Take care! Dr. Eileen Stanford  Please call the internal medicine center clinic if you have any questions or concerns, we may be able to help and keep you from a long and expensive emergency room wait. Our clinic and after hours phone number is [phone], the best time to call is Monday through Friday 9 am to 4 pm but there is always someone available 24/7 if you have an emergency. If you need medication refills please notify your pharmacy one week in advance and they will send [place] a request.

## 2018-12-24 NOTE — Assessment & Plan Note (Addendum)
Pseudogout: Ms. Artus continues to complain of left knee pain that has been unresolved with over-the-counter analgesics.  She has chronic kidney disease which limits her treatment options.  Also given her age, opioid therapy would not be wise.  On physical exams, she has crepitus at the knee.  Knee with Injection Procedure Note  Diagnosis: left knee pain due to pseudogout  Indications: Crystal induced arthropathy  Procedure Details   Point of care ultrasound was used to identify the joint effusion and plan needle trajectory and depth. Consent was obtained for the procedure. The joint was prepped with Betadine. A 25 gauge needle was inserted into the superior aspect of the joint from a medial approach to access the suprapatellar pouch.  1 ml 1% lidocaine and 1 ml of Triamcinolone was then injected into the joint through the same needle. The needle was removed and the area cleansed and dressed.  Complications:  None; patient tolerated the procedure well.

## 2018-12-24 NOTE — Progress Notes (Addendum)
   CC: Left knee pain  HPI:  [name] is a 85 y.o. very pleasant African-American woman with longstanding history of pseudogout involving her left knee here for intra-articular injection.  Please see problem based charting for further details.  Past Medical History:  Diagnosis Date  . Anemia    Normal EGG, colonoscopy, and Sm. Bowel F/T 2006  . Bilateral hand pain 07/09/2016  . Chronic knee pain   . Chronic renal failure   . Degenerative joint disease   . Diabetes mellitus 11/05   type II  . Diabetic peripheral neuropathy ([place])   . Duodenitis   . Gastritis   . Health maintenance examination    Mammogram 10/11: Possible mass, right breast. No evidence of malignancy in left breast. Needle biopsy of left breast 12/11: No evidence of  carcinoma. Patient needs follow-up right breast diagnostic mammogram with ultrasound in 6 months.  . History of diabetic gastroparesis 11/28/2017  . Hyperlipidemia   . Hypertension   . Left knee pain 10/06/2017  . Osteoporosis 09/2009   T score -2.8  . Psychophysiological insomnia 11/15/2016  . Vertigo    Review of Systems:    Review of Systems  Constitutional: Negative.   Musculoskeletal: Positive for joint pain.  Neurological:       -Difficulty ambulating  -No falls     Physical Exam:  Vitals:   12/24/18 1320  BP: (!) 179/89  Pulse: 84  Temp: 98.1 F (36.7 C)  TempSrc: Oral  SpO2: 97%  Weight: 178 lb 4.8 oz (80.9 kg)   Physical Exam  Constitutional: She is well-developed, well-nourished, and in no distress.  Cardiovascular: Normal rate, regular rhythm and normal heart sounds.  Pulmonary/Chest: Effort normal and breath sounds normal. She has no wheezes.  Musculoskeletal: Normal range of motion.        General: Tenderness (Left knee flexion and extension) present. No deformity or edema.     Assessment & Plan:   See Encounters Tab for problem based charting.  Patient discussed with Dr. Philipp Ovens

## 2018-12-25 NOTE — Progress Notes (Signed)
Internal Medicine Clinic Attending  I saw and evaluated the patient.  I personally confirmed the key portions of the history and exam documented by Dr. Eileen Stanford and I reviewed pertinent patient test results.  The assessment, diagnosis, and plan were formulated together and I agree with the documentation in the resident's note. I was present for the procedure.

## 2019-01-28 ENCOUNTER — Other Ambulatory Visit: Payer: Self-pay

## 2019-01-28 ENCOUNTER — Encounter (HOSPITAL_COMMUNITY): Payer: Self-pay

## 2019-01-28 ENCOUNTER — Telehealth: Payer: Self-pay | Admitting: *Deleted

## 2019-01-28 ENCOUNTER — Emergency Department (HOSPITAL_COMMUNITY)
Admission: EM | Admit: 2019-01-28 | Discharge: 2019-01-29 | Disposition: A | Discharge status: Home / Self Care | Payer: Medicare Other | Attending: Emergency Medicine | Admitting: Emergency Medicine

## 2019-01-28 DIAGNOSIS — N1832 Chronic kidney disease, stage 3b: Secondary | ICD-10-CM | POA: Diagnosis not present

## 2019-01-28 DIAGNOSIS — E1122 Type 2 diabetes mellitus with diabetic chronic kidney disease: Secondary | ICD-10-CM | POA: Diagnosis not present

## 2019-01-28 DIAGNOSIS — R200 Anesthesia of skin: Secondary | ICD-10-CM | POA: Diagnosis present

## 2019-01-28 DIAGNOSIS — I129 Hypertensive chronic kidney disease with stage 1 through stage 4 chronic kidney disease, or unspecified chronic kidney disease: Secondary | ICD-10-CM | POA: Diagnosis not present

## 2019-01-28 DIAGNOSIS — Z79899 Other long term (current) drug therapy: Secondary | ICD-10-CM | POA: Diagnosis not present

## 2019-01-28 DIAGNOSIS — M47816 Spondylosis without myelopathy or radiculopathy, lumbar region: Secondary | ICD-10-CM | POA: Insufficient documentation

## 2019-01-28 DIAGNOSIS — M1180 Other specified crystal arthropathies, unspecified site: Secondary | ICD-10-CM

## 2019-01-28 DIAGNOSIS — G629 Polyneuropathy, unspecified: Secondary | ICD-10-CM | POA: Insufficient documentation

## 2019-01-28 DIAGNOSIS — M545 Low back pain: Secondary | ICD-10-CM | POA: Diagnosis not present

## 2019-01-28 DIAGNOSIS — M1990 Unspecified osteoarthritis, unspecified site: Secondary | ICD-10-CM | POA: Diagnosis not present

## 2019-01-28 LAB — CBG MONITORING, ED: Glucose-Capillary: 113 mg/dL — ABNORMAL HIGH (ref 70–99)

## 2019-01-28 LAB — CBC
HCT: 32 % — ABNORMAL LOW (ref 36.0–46.0)
Hemoglobin: 11.2 g/dL — ABNORMAL LOW (ref 12.0–15.0)
MCH: 34.1 pg — ABNORMAL HIGH (ref 26.0–34.0)
MCHC: 35 g/dL (ref 30.0–36.0)
MCV: 97.6 fL (ref 80.0–100.0)
Platelets: 219 10*3/uL (ref 150–400)
RBC: 3.28 MIL/uL — ABNORMAL LOW (ref 3.87–5.11)
RDW: 13.5 % (ref 11.5–15.5)
WBC: 7.2 10*3/uL (ref 4.0–10.5)
nRBC: 0 % (ref 0.0–0.2)

## 2019-01-28 LAB — BASIC METABOLIC PANEL
Anion gap: 12 (ref 5–15)
BUN: 34 mg/dL — ABNORMAL HIGH (ref 8–23)
CO2: 23 mmol/L (ref 22–32)
Calcium: 9.3 mg/dL (ref 8.9–10.3)
Chloride: 103 mmol/L (ref 98–111)
Creatinine, Ser: 1.72 mg/dL — ABNORMAL HIGH (ref 0.44–1.00)
GFR calc Af Amer: 30 mL/min — ABNORMAL LOW (ref >60)
GFR calc non Af Amer: 26 mL/min — ABNORMAL LOW (ref >60)
Glucose, Bld: 126 mg/dL — ABNORMAL HIGH (ref 70–99)
Potassium: 3.1 mmol/L — ABNORMAL LOW (ref 3.5–5.1)
Sodium: 138 mmol/L (ref 135–145)

## 2019-01-28 MED ORDER — SODIUM CHLORIDE 0.9% FLUSH: 3.0000 mL | Freq: Once | INTRAVENOUS | Status: DC

## 2019-01-28 NOTE — ED Triage Notes (Signed)
Pt reports bilateral hand cramps and bilateral feet numbness for the past 3-4 months but states it has gotten worse. Pt denies headaches. Pt a.o, nad noted

## 2019-01-28 NOTE — Telephone Encounter (Signed)
Incoming fax from pt's pharmacy with the following message: olmesartan-hctz 40-25 mg tabs take one tab daily Medication is on backorder and is unavailable.  Please send alternative.  Suggested alternative: Losartan-hctz 100-25 mg tab Will send to pcp for review, please advise.Despina Hidden Cassady10/1/20204:04 PM

## 2019-01-29 ENCOUNTER — Emergency Department (HOSPITAL_COMMUNITY): Payer: Medicare Other

## 2019-01-29 DIAGNOSIS — M545 Low back pain: Secondary | ICD-10-CM | POA: Diagnosis not present

## 2019-01-29 DIAGNOSIS — G629 Polyneuropathy, unspecified: Secondary | ICD-10-CM | POA: Diagnosis not present

## 2019-01-29 MED ORDER — VOLTAREN 1 % TD GEL: 4.0000 g | Freq: Two times a day (BID) | TRANSDERMAL | 3 refills | Status: DC

## 2019-01-29 MED ORDER — LIDOCAINE 4 % EX CREA
TOPICAL_CREAM | Freq: Once | CUTANEOUS | Status: AC
  Administered 2019-01-29: 1 via TOPICAL
  Filled 2019-01-29: qty 5

## 2019-01-29 MED ORDER — LIDOCAINE VISCOUS HCL 2 % MT SOLN: 15.0000 mL | OROMUCOSAL | 0 refills | Status: DC | PRN

## 2019-01-29 NOTE — Discharge Instructions (Signed)
Take the topical gel prescribed. You should alternate between the two. We recommend continued follow up with your doctor for this pain.

## 2019-01-29 NOTE — ED Notes (Signed)
Patient transported to X-ray 

## 2019-01-29 NOTE — ED Notes (Signed)
Pt states improved pain relief from lidocaine lotion

## 2019-01-29 NOTE — ED Provider Notes (Signed)
[facility] EMERGENCY DEPARTMENT Provider Note   CSN: [id number] Arrival date & time: 01/28/19  1558     History   Chief Complaint Chief Complaint  Patient presents with   Numbness    HPI Wanda Mclaughlin is a 85 y.o. female.     HPI  85 year old comes in a chief complaint of numbness and back pain.  She reports that she has had bilateral hand and feet numbness for the last 2 or 3 months.  Her symptoms have progressed over the past few days.  Numbness is described as tingling sensation.  The symptoms are constant and there is no specific evoking, aggravating or relieving factors.  Patient has been taking her medications as prescribed.  She reports that she has mentioned this complaint to her PCP and they think it is because of arthritis.  She is also complaining of some worsening back pain.  There is no recent trauma.  It is unclear whether she is taking her Neurontin and Voltaren gel.  She resides with family and the daughter is at the bedside and provided some further insight.  Past Medical History:  Diagnosis Date   Anemia    Normal EGG, colonoscopy, and Sm. Bowel F/T 2006   Bilateral hand pain 07/09/2016   Chronic knee pain    Chronic renal failure    Degenerative joint disease    Diabetes mellitus 11/05   type II   Diabetic peripheral neuropathy ([facility])    Duodenitis    Gastritis    Health maintenance examination    Mammogram 10/11: Possible mass, right breast. No evidence of malignancy in left breast. Needle biopsy of left breast 12/11: No evidence of  carcinoma. Patient needs follow-up right breast diagnostic mammogram with ultrasound in 6 months.   History of diabetic gastroparesis 11/28/2017   Hyperlipidemia    Hypertension    Left knee pain 10/06/2017   Osteoporosis 09/2009   T score -2.8   Psychophysiological insomnia 11/15/2016   Vertigo     Patient Active Problem List   Diagnosis Date Noted   Spasm of back muscles 11/28/2017     CKD (chronic kidney disease) stage 3, GFR 30-59 ml/min 03/29/2016   Presbycusis of both ears 03/12/2016   Calcium pyrophosphate crystal arthritis 09/05/2015   Health care maintenance 03/15/2013   Chronic anemia 10/20/2006   Diabetes mellitus with neuropathy ([facility]) 02/13/2006   Hyperlipidemia 02/13/2006   Essential hypertension 02/13/2006    Past Surgical History:  Procedure Laterality Date    Right needle localized lumpectomy.   12/11   Path report:FOCAL ATYPICAL DUCTAL HYPERPLASIA IN A BACKGROUND OF STROMAL  FIBROSIS. No evidence of carcinoma.    ABDOMINAL HYSTERECTOMY     1980's (Fibroids)   COLONOSCOPY     8/11 by Dr. Christella Hartigan: Normal colon, Given your age, you will not need another colonoscopy for colon cancer screening or polyp surveillance     OB History   No obstetric history on file.      Home Medications    Prior to Admission medications   Medication Sig Start Date End Date Taking? Authorizing Provider  acetaminophen (TYLENOL) 325 MG tablet Take 650 mg by mouth every 6 (six) hours as needed for mild pain.   Yes [provider]  ferrous sulfate 325 (65 FE) MG tablet TAKE 1 TABLET BY MOUTH EVERY DAY Patient taking differently: Take 325 mg by mouth daily with breakfast.  01/19/18  Yes Agyei, Hermina Staggers, MD  gabapentin (NEURONTIN) 400 MG  capsule Take 1 capsule (400 mg total) by mouth 2 (two) times daily. 11/11/18  Yes Agyei, Hermina Staggersbed K, MD  ibuprofen (ADVIL) 200 MG tablet Take 200 mg by mouth every 6 (six) hours as needed for mild pain.   Yes [provider]  olmesartan-hydrochlorothiazide (BENICAR HCT) 40-25 MG tablet Take 1 tablet by mouth daily. 11/11/18  Yes Agyei, Hermina Staggersbed K, MD  omeprazole (PRILOSEC) 20 MG capsule Take 1 capsule (20 mg total) by mouth daily as needed. Patient taking differently: Take 20 mg by mouth daily.  11/11/18  Yes Agyei, Hermina Staggersbed K, MD  ACCU-CHEK AVIVA PLUS test strip USE AS DIRECTED 02/23/18   Agyei, Hermina Staggersbed K, MD  amLODipine (NORVASC)  10 MG tablet TAKE 1 TABLET BY MOUTH EVERY DAY Patient not taking: Reported on 01/29/2019 10/06/18   Yvette RackAgyei, Obed K, MD  cyclobenzaprine (FLEXERIL) 10 MG tablet Take 1 tablet (10 mg total) by mouth 3 (three) times daily as needed for muscle spasms. Patient not taking: Reported on 01/29/2019 11/11/18   Yvette RackAgyei, Obed K, MD  lidocaine (XYLOCAINE) 2 % solution Use as directed 15 mLs in the mouth or throat as needed for mouth pain. 01/29/19   Derwood KaplanNanavati, Dannisha Eckmann, MD  Melatonin 5 MG CAPS Take 1 capsule (5 mg total) by mouth at bedtime. Patient not taking: Reported on 01/29/2019 11/15/16   Fuller Planice, Christopher W, MD  senna (SENOKOT) 8.6 MG tablet Take 2-4 tablets by mouth at bedtime as needed for constipation. Patient not taking: Reported on 01/29/2019 07/05/10   Vega-Casasnovas, Ramses L, MD  VOLTAREN 1 % GEL Apply 4 g topically 2 (two) times daily. 01/29/19   Derwood KaplanNanavati, Silvio Sausedo, MD    Family History Family History  Problem Relation Age of Onset   Diabetes Sister     Social History Social History   Tobacco Use   Smoking status: Never Smoker   Smokeless tobacco: Never Used  Substance Use Topics   Alcohol use: No    Alcohol/week: 0.0 standard drinks   Drug use: No     Allergies   Lisinopril   Review of Systems Review of Systems  Constitutional: Positive for activity change.  Genitourinary: Negative for difficulty urinating, dysuria, flank pain and hematuria.  Musculoskeletal: Positive for back pain.  Neurological: Positive for numbness.     Physical Exam Updated Vital Signs BP (!) 165/78    Pulse 79    Temp 98.5 F (36.9 C) (Oral)    Resp (!) 21    SpO2 97%   Physical Exam Vitals signs and nursing note reviewed.  Constitutional:      Appearance: She is well-developed.  HENT:     Head: Normocephalic and atraumatic.  Neck:     Musculoskeletal: Normal range of motion and neck supple.  Cardiovascular:     Rate and Rhythm: Normal rate.  Pulmonary:     Effort: Pulmonary effort is normal.   Abdominal:     General: Bowel sounds are normal.  Skin:    General: Skin is warm and dry.  Neurological:     Mental Status: She is alert and oriented to person, place, and time.     Comments: Gross sensory exam for upper and lower extremities equal.  Patient is able to discriminate between sharp and dull over her hands and also her feet. She has 1+ and equal patellar reflex bilaterally. There is focal tenderness over the lower lumbar spine.      ED Treatments / Results  Labs (all labs ordered are listed, but only  abnormal results are displayed) Labs Reviewed  BASIC METABOLIC PANEL - Abnormal; Notable for the following components:      Result Value   Potassium 3.1 (*)    Glucose, Bld 126 (*)    BUN 34 (*)    Creatinine, Ser 1.72 (*)    GFR calc non Af Amer 26 (*)    GFR calc Af Amer 30 (*)    All other components within normal limits  CBC - Abnormal; Notable for the following components:   RBC 3.28 (*)    Hemoglobin 11.2 (*)    HCT 32.0 (*)    MCH 34.1 (*)    All other components within normal limits  CBG MONITORING, ED - Abnormal; Notable for the following components:   Glucose-Capillary 113 (*)    All other components within normal limits    EKG None  Radiology Dg Lumbar Spine Complete  Result Date: 01/29/2019 CLINICAL DATA:  85 year old with back pain for months. Leg cramping. EXAM: LUMBAR SPINE - COMPLETE 4+ VIEW COMPARISON:  None. FINDINGS: 8 mm anterolisthesis of L4 on L5. alignment otherwise maintained. The vertebral body heights are normal. No acute fracture. Disc space narrowing at L1-L2 and L5-S1 with endplate spurring. Facet hypertrophy at L3-L4 and L4-L5. No obvious pars defects. No focal bone abnormality. Sacroiliac joints are congruent. IMPRESSION: 1. Anterolisthesis of L4 on L5 of 8 mm, likely degenerative. 2. Multilevel degenerative disc disease and facet arthropathy. 3. No acute abnormality. Electronically Signed   By: Narda Rutherford M.D.   On:  01/29/2019 01:28    Procedures Procedures (including critical care time)  Medications Ordered in ED Medications  lidocaine (LMX) 4 % cream (1 application Topical Given 01/29/19 0107)     Initial Impression / Assessment and Plan / ED Course  I have reviewed the triage vital signs and the nursing notes.  Pertinent labs & imaging results that were available during my care of the patient were reviewed by me and considered in my medical decision making (see chart for details).        85 year old with history of diabetes comes in a chief complaint of bilateral hand and feet pain.  She describes the pain as numbness and sharp pain.  She has been diagnosed with neuropathy as per the notes.  She has been prescribed Voltaren gel and gabapentin, however it is unclear if she is taking them.  She has some back pain as well.  X-rays ordered as we do not have any recent imaging.  No signs of cancer.  Clinically there is no evidence of cord compression or cauda equina syndrome.  Neck exam is normal and there is no focal C-spine tenderness or worsening of the symptoms with turning of the neck.  Plan is for patient to be given Naprosyn only for 4 days given her age and elevated creatinine.  In addition she has been advised to take Voltaren gel twice daily and alternated with lidocaine gel twice daily.  We have asked her to follow-up with her PCP as well and the daughter has been asked to follow-up on Neurontin.  Final Clinical Impressions(s) / ED Diagnoses   Final diagnoses:  Neuropathy  Lumbar arthropathy    ED Discharge Orders         Ordered    VOLTAREN 1 % GEL  2 times daily     01/29/19 0155    lidocaine (XYLOCAINE) 2 % solution  As needed     01/29/19 0155  Varney Biles, MD 01/29/19 [phone]

## 2019-01-29 NOTE — ED Notes (Signed)
Discharge instructions, including pain management and medications discussed with pt and sister at bedside. Both verbalized understanding with no questions at this time.

## 2019-02-01 ENCOUNTER — Other Ambulatory Visit: Payer: Self-pay | Admitting: Internal Medicine

## 2019-02-01 DIAGNOSIS — I10 Essential (primary) hypertension: Secondary | ICD-10-CM

## 2019-02-01 MED ORDER — LOSARTAN POTASSIUM-HCTZ 100-25 MG PO TABS: 1.0000 | ORAL_TABLET | Freq: Every day | ORAL | 3 refills | Status: DC

## 2019-02-01 NOTE — Telephone Encounter (Signed)
Thanks Kaye

## 2019-02-15 ENCOUNTER — Other Ambulatory Visit: Payer: Self-pay | Admitting: *Deleted

## 2019-02-15 MED ORDER — ACCU-CHEK AVIVA PLUS VI STRP: ORAL_STRIP | 3 refills | Status: DC

## 2019-04-15 ENCOUNTER — Other Ambulatory Visit: Payer: Self-pay | Admitting: Internal Medicine

## 2019-04-15 DIAGNOSIS — I10 Essential (primary) hypertension: Secondary | ICD-10-CM

## 2019-04-29 ENCOUNTER — Other Ambulatory Visit: Payer: Self-pay | Admitting: Internal Medicine

## 2019-04-29 DIAGNOSIS — I10 Essential (primary) hypertension: Secondary | ICD-10-CM

## 2019-05-31 ENCOUNTER — Other Ambulatory Visit: Payer: Self-pay

## 2019-05-31 MED ORDER — FERROUS SULFATE 325 (65 FE) MG PO TABS: 325.0000 mg | ORAL_TABLET | Freq: Every day | ORAL | 2 refills | Status: DC

## 2019-07-12 ENCOUNTER — Encounter: Payer: Self-pay | Admitting: Internal Medicine

## 2019-07-12 ENCOUNTER — Ambulatory Visit (INDEPENDENT_AMBULATORY_CARE_PROVIDER_SITE_OTHER): Payer: Medicare Other | Admitting: Internal Medicine

## 2019-07-12 ENCOUNTER — Other Ambulatory Visit: Payer: Self-pay

## 2019-07-12 VITALS — BP 150/62 | HR 92 | Temp 99.0°F | Ht 63.0 in | Wt 186.4 lb

## 2019-07-12 DIAGNOSIS — Z79899 Other long term (current) drug therapy: Secondary | ICD-10-CM

## 2019-07-12 DIAGNOSIS — M11862 Other specified crystal arthropathies, left knee: Secondary | ICD-10-CM

## 2019-07-12 DIAGNOSIS — M62830 Muscle spasm of back: Secondary | ICD-10-CM

## 2019-07-12 DIAGNOSIS — Z7984 Long term (current) use of oral hypoglycemic drugs: Secondary | ICD-10-CM

## 2019-07-12 DIAGNOSIS — Z23 Encounter for immunization: Secondary | ICD-10-CM | POA: Diagnosis not present

## 2019-07-12 DIAGNOSIS — M1180 Other specified crystal arthropathies, unspecified site: Secondary | ICD-10-CM

## 2019-07-12 DIAGNOSIS — N1832 Chronic kidney disease, stage 3b: Secondary | ICD-10-CM

## 2019-07-12 DIAGNOSIS — M1712 Unilateral primary osteoarthritis, left knee: Secondary | ICD-10-CM | POA: Diagnosis not present

## 2019-07-12 DIAGNOSIS — E1140 Type 2 diabetes mellitus with diabetic neuropathy, unspecified: Secondary | ICD-10-CM

## 2019-07-12 DIAGNOSIS — E1122 Type 2 diabetes mellitus with diabetic chronic kidney disease: Secondary | ICD-10-CM

## 2019-07-12 DIAGNOSIS — G8929 Other chronic pain: Secondary | ICD-10-CM | POA: Diagnosis not present

## 2019-07-12 DIAGNOSIS — I129 Hypertensive chronic kidney disease with stage 1 through stage 4 chronic kidney disease, or unspecified chronic kidney disease: Secondary | ICD-10-CM

## 2019-07-12 DIAGNOSIS — I10 Essential (primary) hypertension: Secondary | ICD-10-CM

## 2019-07-12 DIAGNOSIS — N183 Chronic kidney disease, stage 3 unspecified: Secondary | ICD-10-CM | POA: Diagnosis not present

## 2019-07-12 LAB — POCT GLYCOSYLATED HEMOGLOBIN (HGB A1C): Hemoglobin A1C: 7.6 % — AB (ref 4.0–5.6)

## 2019-07-12 LAB — GLUCOSE, CAPILLARY: Glucose-Capillary: 150 mg/dL — ABNORMAL HIGH (ref 70–99)

## 2019-07-12 MED ORDER — LOSARTAN POTASSIUM-HCTZ 100-25 MG PO TABS: 1.0000 | ORAL_TABLET | Freq: Every day | ORAL | 1 refills | Status: DC

## 2019-07-12 MED ORDER — CYCLOBENZAPRINE HCL 10 MG PO TABS: 10.0000 mg | ORAL_TABLET | Freq: Three times a day (TID) | ORAL | 1 refills | Status: DC | PRN

## 2019-07-12 MED ORDER — METFORMIN HCL 500 MG PO TABS: 500.0000 mg | ORAL_TABLET | Freq: Two times a day (BID) | ORAL | 11 refills | Status: DC

## 2019-07-12 NOTE — Assessment & Plan Note (Signed)
Hypertension: At goal.  Her goal is less than or equal to 150/90  BP Readings from Last 3 Encounters:  07/12/19 (!) 150/62  01/29/19 (!) 165/78  12/24/18 (!) 179/89   Plan: -Continue amlodipine 10 mg daily -Continue combination losartan-HCTZ 100-25 mg daily -Follow-up BMP

## 2019-07-12 NOTE — Progress Notes (Signed)
Internal Medicine Clinic Attending  I saw and evaluated the patient.  I personally confirmed the key portions of the history and exam documented by Dr. Dortha Schwalbe and I reviewed pertinent patient test results.  The assessment, diagnosis, and plan were formulated together and I agree with the documentation in the resident's note.  I was present for the entirety of the procedure.

## 2019-07-12 NOTE — Assessment & Plan Note (Signed)
Type 2 diabetes mellitus: Her A1c has been steadily increasing.  It looks like her Metformin was discontinued in 2017 due to good glycemic control and since then she has not been on any medication and has been doing well however recently her hemoglobin A1c has been steadily increasing.  Recently 7.6<<7.4<<7<<6.8  Plan: -Resume Metformin at 500 mg daily -Follow-up 3 months A1c

## 2019-07-12 NOTE — Patient Instructions (Signed)
Ms. Hubbs, thanks for seeing me today.  Today, I was able to inject your left knee and I hope it helps her with the pain.  As we discussed I am going to check some blood work.  Continue all your medications.  Take care!

## 2019-07-12 NOTE — Assessment & Plan Note (Signed)
Chronic left knee pain/DJD: She has chronic left knee pain that is multifactorial caused by pseudogout and osteoarthritis.  She received routine steroid injection of the knee.  Her last injection was August 2020.  She reports of relief with her injections as well as using Voltaren gel.  Knee with Injection Procedure Note  Diagnosis: left knee pain due to osteoarthritis  Indications:  Osteoarthritis  Procedure Details   The joint was prepped with Betadine. A 25 gauge needle was inserted into the lateral aspect of the joint from a lateral approach to access the suprapatellar pouch.  2 ml 1% lidocaine and 1 ml of Triamcinolone was then injected into the joint through the same needle. The needle was removed and the area cleansed and dressed.  Complications:  None; patient tolerated the procedure well.

## 2019-07-12 NOTE — Progress Notes (Signed)
   CC: Follow-up hypertension, left knee pain  HPI:  Wanda Mclaughlin is a 86 y.o. community dwelling African-American woman with medical history listed below presented to follow-up on chronic medical problems as well as with complaints of chronic left knee pain.  Please see problem based charting for further details.  Past Medical History:  Diagnosis Date  . Anemia    Normal EGG, colonoscopy, and Sm. Bowel F/T 2006  . Bilateral hand pain 07/09/2016  . Chronic knee pain   . Chronic renal failure   . Degenerative joint disease   . Diabetes mellitus 11/05   type II  . Diabetic peripheral neuropathy (HCC)   . Duodenitis   . Gastritis   . Health maintenance examination    Mammogram 10/11: Possible mass, right breast. No evidence of malignancy in left breast. Needle biopsy of left breast 12/11: No evidence of  carcinoma. Patient needs follow-up right breast diagnostic mammogram with ultrasound in 6 months.  . History of diabetic gastroparesis 11/28/2017  . Hyperlipidemia   . Hypertension   . Left knee pain 10/06/2017  . Osteoporosis 09/2009   T score -2.8  . Psychophysiological insomnia 11/15/2016  . Vertigo    Review of Systems: As per HPI  Physical Exam:  Vitals:   07/12/19 1354  BP: (!) 150/62  Pulse: 92  Temp: 99 F (37.2 C)  TempSrc: Oral  SpO2: 95%  Weight: 186 lb 6.4 oz (84.6 kg)  Height: 5\' 3"  (1.6 m)   Physical Exam  Constitutional: She is well-developed, well-nourished, and in no distress.  Cardiovascular: Normal rate.  No murmur heard. Pulmonary/Chest: Effort normal and breath sounds normal. No respiratory distress. She has no wheezes. She has no rales.  Musculoskeletal:        General: Tenderness (Flexion of left knee) present. No deformity or edema.     Comments: Left knee crepitus on flexion  Psychiatric: Affect normal.    Assessment & Plan:   See Encounters Tab for problem based charting.  Patient discussed with Dr. 

## 2019-07-12 NOTE — Assessment & Plan Note (Signed)
Follow BMP today

## 2019-07-13 LAB — BMP8+ANION GAP
Anion Gap: 17 mmol/L (ref 10.0–18.0)
BUN/Creatinine Ratio: 18 (ref 12–28)
BUN: 26 mg/dL (ref 8–27)
CO2: 21 mmol/L (ref 20–29)
Calcium: 9.8 mg/dL (ref 8.7–10.3)
Chloride: 98 mmol/L (ref 96–106)
Creatinine, Ser: 1.43 mg/dL — ABNORMAL HIGH (ref 0.57–1.00)
GFR calc Af Amer: 38 mL/min/{1.73_m2} — ABNORMAL LOW (ref >59)
GFR calc non Af Amer: 33 mL/min/{1.73_m2} — ABNORMAL LOW (ref >59)
Glucose: 157 mg/dL — ABNORMAL HIGH (ref 65–99)
Potassium: 4 mmol/L (ref 3.5–5.2)
Sodium: 136 mmol/L (ref 134–144)

## 2019-07-19 ENCOUNTER — Telehealth: Payer: Self-pay | Admitting: *Deleted

## 2019-07-19 ENCOUNTER — Other Ambulatory Visit: Payer: Self-pay | Admitting: Internal Medicine

## 2019-07-19 NOTE — Telephone Encounter (Signed)
Pt states she has had diarrhea, N&V since starting the metformin.  Please send something to replace [facility] to [facility] [address]

## 2019-07-19 NOTE — Telephone Encounter (Signed)
I called her. Thanks.

## 2019-07-19 NOTE — Progress Notes (Signed)
I called Ms. Hingle regarding the side effect she has been having with the Metformin.  She reports of nausea, vomiting and diarrhea.  I advised her that she can take One 500 mg pill every other day and see how she does.  If she continues to have symptoms, we can certainly try switching to an SGLT2 inhibitor or DPP 4.

## 2019-07-31 ENCOUNTER — Other Ambulatory Visit: Payer: Self-pay | Admitting: Internal Medicine

## 2019-07-31 DIAGNOSIS — E1140 Type 2 diabetes mellitus with diabetic neuropathy, unspecified: Secondary | ICD-10-CM

## 2019-08-24 ENCOUNTER — Encounter: Payer: Self-pay | Admitting: *Deleted

## 2019-10-18 ENCOUNTER — Other Ambulatory Visit: Payer: Self-pay | Admitting: Internal Medicine

## 2019-10-18 DIAGNOSIS — I10 Essential (primary) hypertension: Secondary | ICD-10-CM

## 2019-10-21 IMAGING — CR DG LUMBAR SPINE COMPLETE 4+V
5 series · 5 of 5 positions shown · non-contrast
Comparison: None.

CLINICAL DATA: 89-year-old with back pain for months. Leg cramping.

EXAM:
LUMBAR SPINE - COMPLETE 4+ VIEW

[l-spine ap]
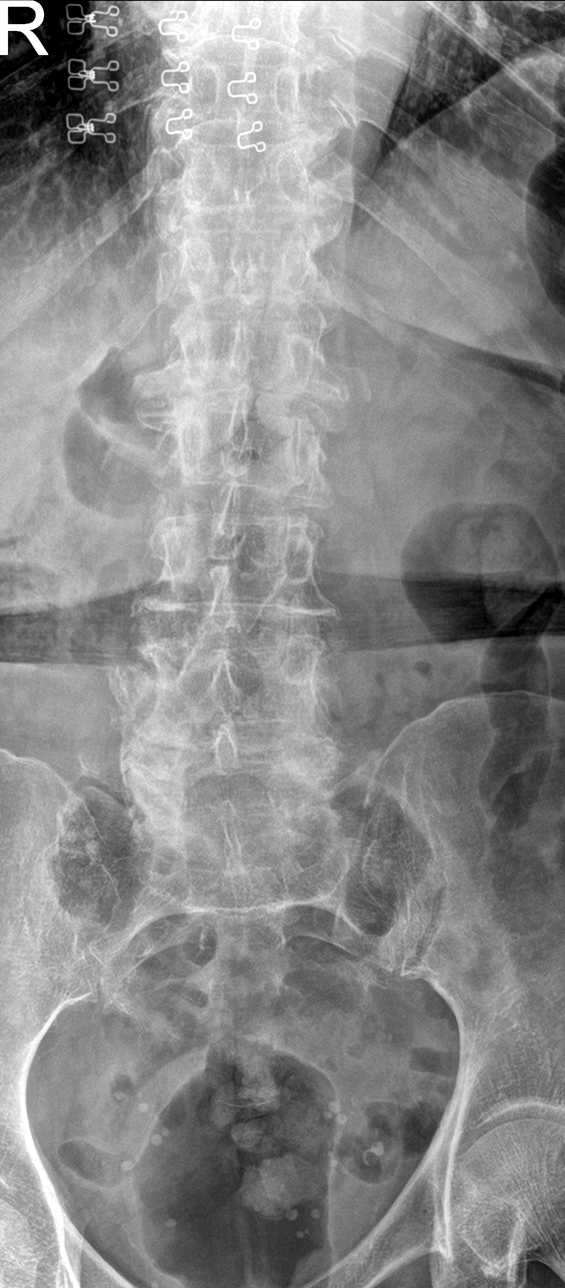

[l-spine obl (1 of 2)]
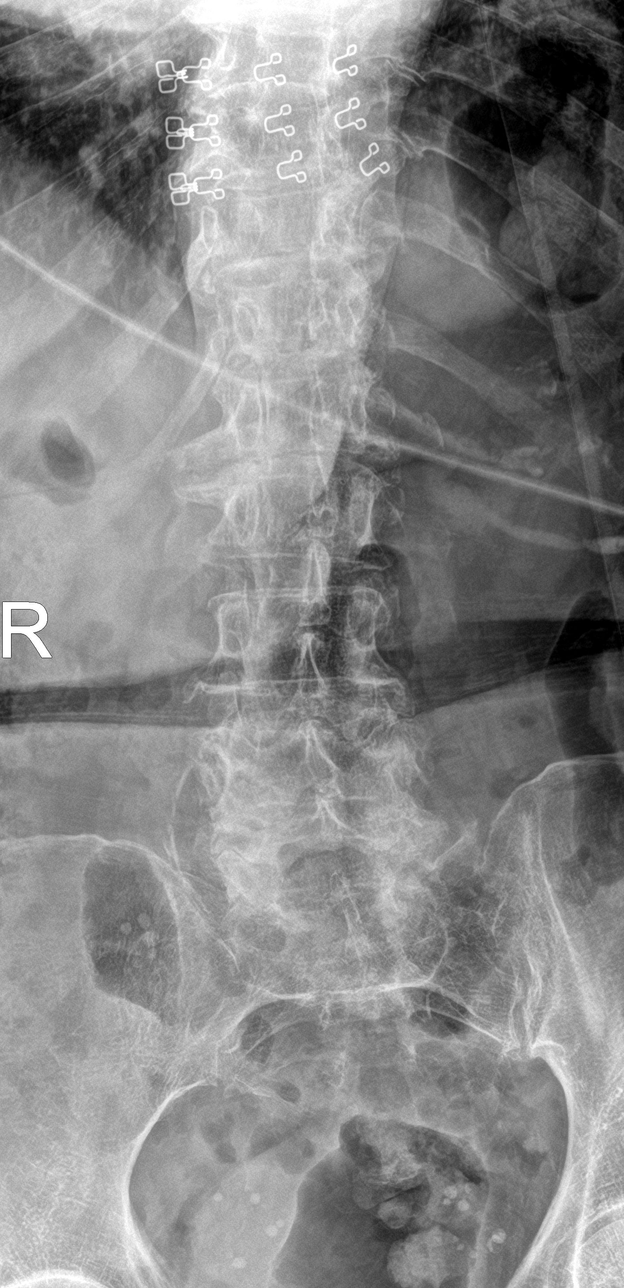

[l-spine obl (2 of 2)]
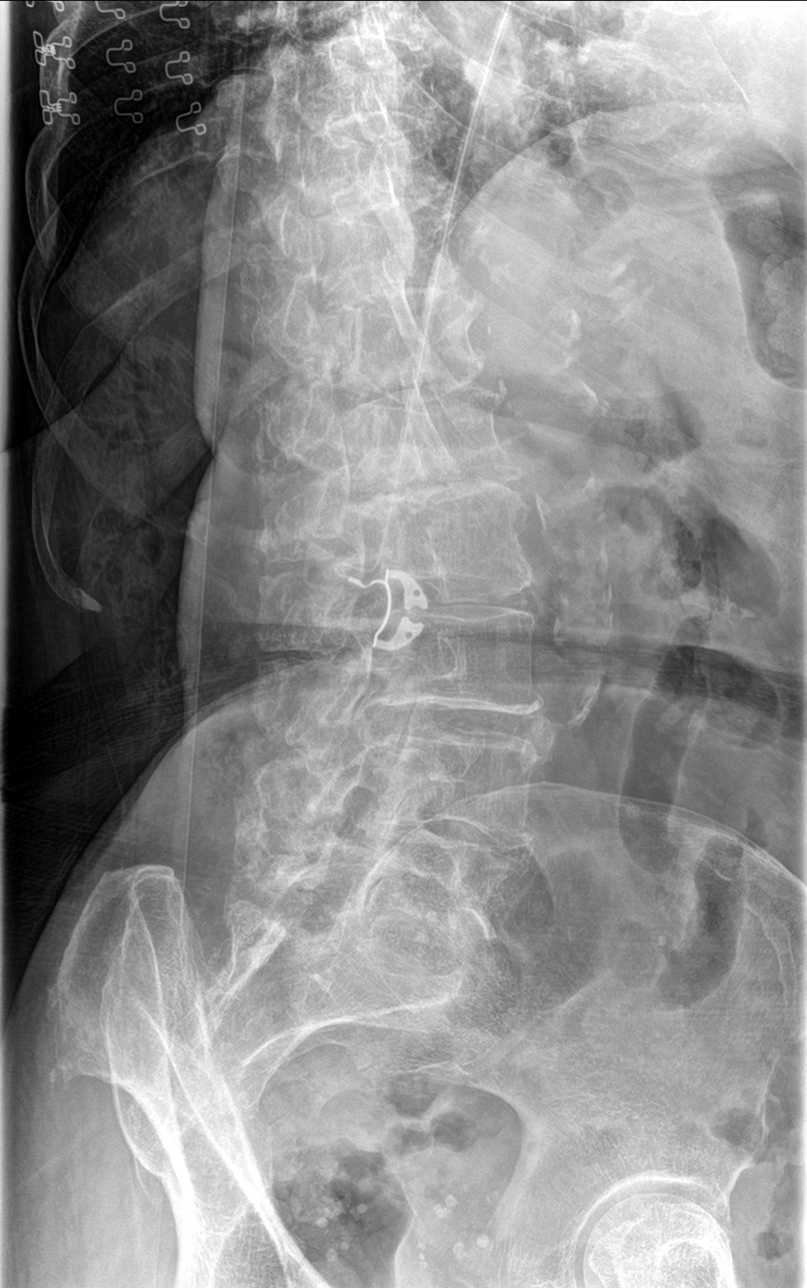

[l-spine lat]
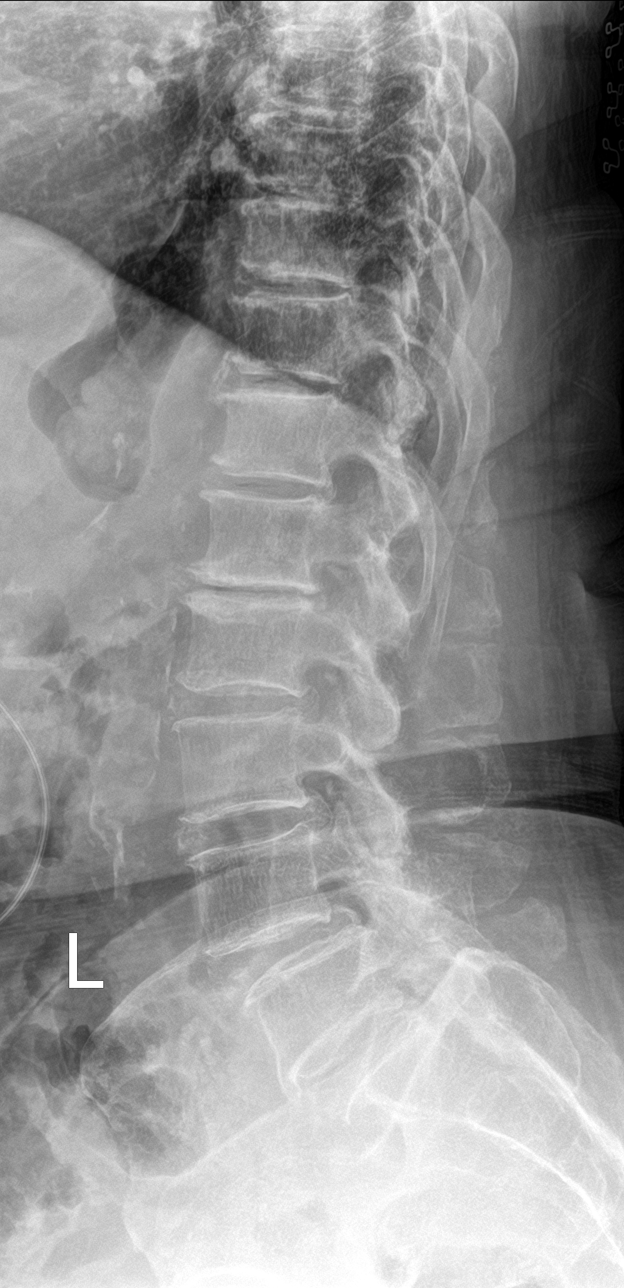

[l-spine spot]
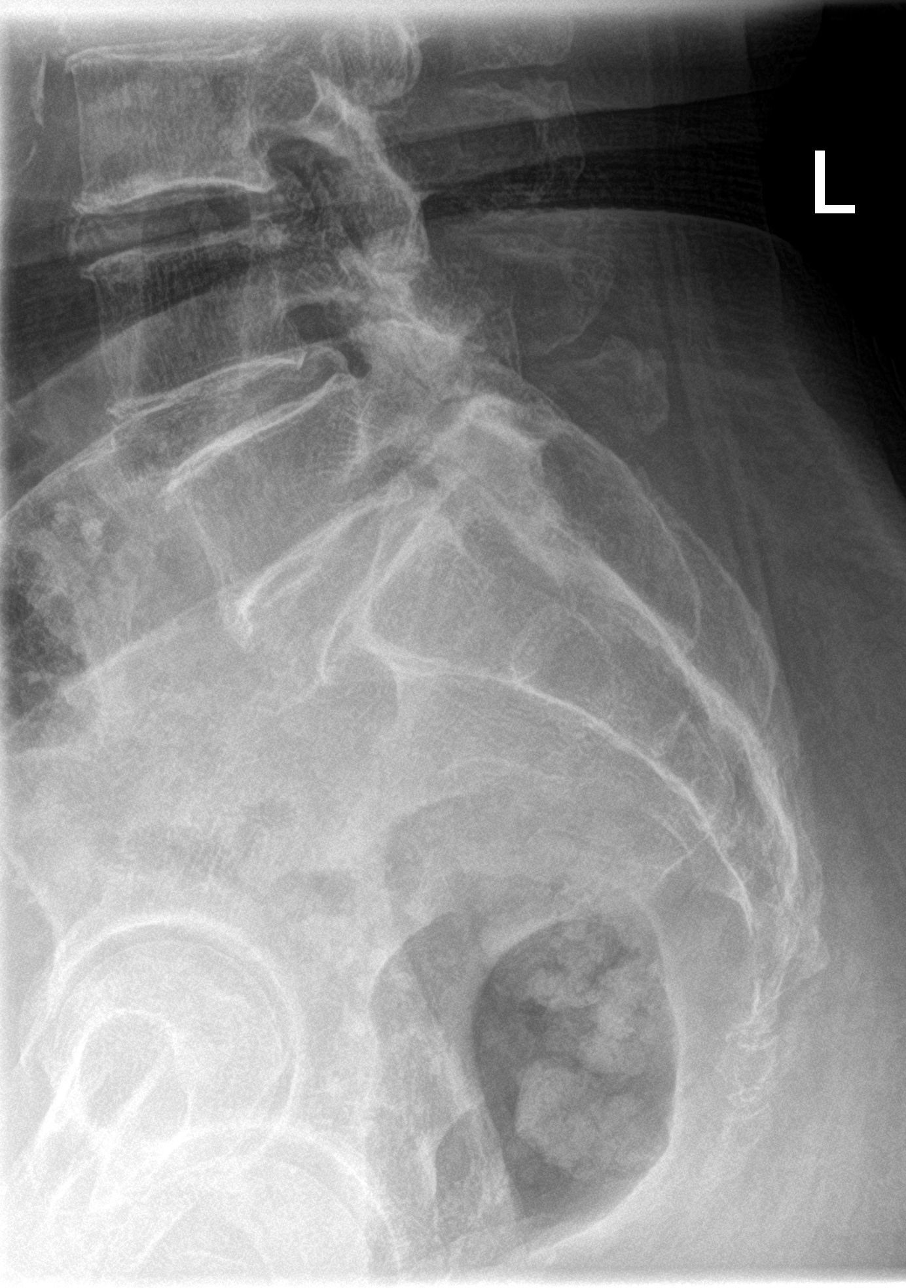

[5 of 5 positions shown; findings below may reference images not displayed]

FINDINGS: 8 mm anterolisthesis of L4 on L5. alignment otherwise maintained.
The vertebral body heights are normal. No acute fracture. Disc space
narrowing at L1-L2 and L5-S1 with endplate spurring. Facet
hypertrophy at L3-L4 and L4-L5. No obvious pars defects. No focal
bone abnormality. Sacroiliac joints are congruent.
IMPRESSION: 1. Anterolisthesis of L4 on L5 of 8 mm, likely degenerative.
2. Multilevel degenerative disc disease and facet arthropathy.
3. No acute abnormality.

## 2019-10-26 ENCOUNTER — Other Ambulatory Visit: Payer: Self-pay | Admitting: *Deleted

## 2019-10-26 ENCOUNTER — Ambulatory Visit (INDEPENDENT_AMBULATORY_CARE_PROVIDER_SITE_OTHER): Payer: Medicare Other | Admitting: Internal Medicine

## 2019-10-26 ENCOUNTER — Encounter: Payer: Self-pay | Admitting: Internal Medicine

## 2019-10-26 VITALS — BP 145/72 | HR 94 | Temp 99.3°F | Ht 63.0 in | Wt 181.3 lb

## 2019-10-26 DIAGNOSIS — H9113 Presbycusis, bilateral: Secondary | ICD-10-CM | POA: Diagnosis not present

## 2019-10-26 DIAGNOSIS — M62830 Muscle spasm of back: Secondary | ICD-10-CM

## 2019-10-26 DIAGNOSIS — Z0000 Encounter for general adult medical examination without abnormal findings: Secondary | ICD-10-CM

## 2019-10-26 DIAGNOSIS — I10 Essential (primary) hypertension: Secondary | ICD-10-CM

## 2019-10-26 DIAGNOSIS — E1140 Type 2 diabetes mellitus with diabetic neuropathy, unspecified: Secondary | ICD-10-CM

## 2019-10-26 LAB — POCT GLYCOSYLATED HEMOGLOBIN (HGB A1C): Hemoglobin A1C: 6.7 % — AB (ref 4.0–5.6)

## 2019-10-26 LAB — GLUCOSE, CAPILLARY: Glucose-Capillary: 146 mg/dL — ABNORMAL HIGH (ref 70–99)

## 2019-10-26 MED ORDER — CYCLOBENZAPRINE HCL 10 MG PO TABS: 10.0000 mg | ORAL_TABLET | Freq: Three times a day (TID) | ORAL | 1 refills | Status: DC | PRN

## 2019-10-26 MED ORDER — OMEPRAZOLE 20 MG PO CPDR: 20.0000 mg | DELAYED_RELEASE_CAPSULE | Freq: Every day | ORAL | 1 refills | Status: DC | PRN

## 2019-10-26 NOTE — Assessment & Plan Note (Signed)
Hypertension: Well-controlled.  Goal is <150/90  Plan: -Continue amlodipine 10 mg daily -Continue combination losartan-HCTZ 100-25 mg daily

## 2019-10-26 NOTE — Assessment & Plan Note (Signed)
Type 2 diabetes mellitus: Well-controlled.  Home blood glucose has ranged from 120s-150s with no reported hypoglycemia.  A1c today 6.7% from 7.6% when last checked.  Plan: -Continue Metformin 500 mg twice daily

## 2019-10-26 NOTE — Patient Instructions (Signed)
Wanda Mclaughlin,   Thanks for seeing me today. You are doing well and your A1C today is 6.7% which is good.   I will encourage you to get the COVID vaccine.   Take care!  Dr. Dortha Schwalbe  Please call the internal medicine center clinic if you have any questions or concerns, we may be able to help and keep you from a long and expensive emergency room wait. Our clinic and after hours phone number is [phone], the best time to call is Monday through Friday 9 am to 4 pm but there is always someone available 24/7 if you have an emergency. If you need medication refills please notify your pharmacy one week in advance and they will send [place] a request.

## 2019-10-26 NOTE — Assessment & Plan Note (Signed)
Presbycusis: Advised for her to follow-up with ENT for evaluation for hearing aids

## 2019-10-26 NOTE — Assessment & Plan Note (Signed)
Health maintenance: -Foot exam today -Referral to optometry for diabetic eye exam -States [facility]

## 2019-10-26 NOTE — Progress Notes (Signed)
   CC: Follow up T2DM  HPI:  Wanda Mclaughlin is a 86 y.o. very pleasant African-American woman with medical history listed below presented to follow-up on chronic medical problems.  Please see problem based charting for further details.  Past Medical History:  Diagnosis Date  . Anemia    Normal EGG, colonoscopy, and Sm. Bowel F/T 2006  . Bilateral hand pain 07/09/2016  . Chronic knee pain   . Chronic renal failure   . Degenerative joint disease   . Diabetes mellitus 11/05   type II  . Diabetic peripheral neuropathy (HCC)   . Duodenitis   . Gastritis   . Health maintenance examination    Mammogram 10/11: Possible mass, right breast. No evidence of malignancy in left breast. Needle biopsy of left breast 12/11: No evidence of  carcinoma. Patient needs follow-up right breast diagnostic mammogram with ultrasound in 6 months.  . History of diabetic gastroparesis 11/28/2017  . Hyperlipidemia   . Hypertension   . Left knee pain 10/06/2017  . Osteoporosis 09/2009   T score -2.8  . Psychophysiological insomnia 11/15/2016  . Vertigo    Review of Systems:  As per HPI  Physical Exam:  Vitals:   10/26/19 1348  BP: (!) 145/72  Pulse: 94  Temp: 99.3 F (37.4 C)  TempSrc: Oral  SpO2: 95%  Weight: 181 lb 4.8 oz (82.2 kg)  Height: 5\' 3"  (1.6 m)   Physical Exam Cardiovascular:     Rate and Rhythm: Normal rate.     Heart sounds: Murmur (Sofy systolic ) heard.   Pulmonary:     Effort: No respiratory distress.     Breath sounds: Normal breath sounds. No wheezing.  Neurological:     Mental Status: She is alert.  Psychiatric:        Mood and Affect: Mood normal.        Behavior: Behavior normal.     Assessment & Plan:   See Encounters Tab for problem based charting.  Patient discussed with Dr. 

## 2019-10-27 ENCOUNTER — Encounter: Payer: Medicare Other | Admitting: Internal Medicine

## 2019-10-27 NOTE — Progress Notes (Signed)
Internal Medicine Clinic Attending  Case discussed with Dr. Agyei at the time of the visit.  We reviewed the resident's history and exam and pertinent patient test results.  I agree with the assessment, diagnosis, and plan of care documented in the resident's note.    

## 2019-12-03 ENCOUNTER — Other Ambulatory Visit: Payer: Self-pay | Admitting: Internal Medicine

## 2020-01-04 DIAGNOSIS — H903 Sensorineural hearing loss, bilateral: Secondary | ICD-10-CM | POA: Diagnosis not present

## 2020-01-25 ENCOUNTER — Other Ambulatory Visit: Payer: Self-pay

## 2020-01-25 ENCOUNTER — Encounter: Payer: Self-pay | Admitting: Internal Medicine

## 2020-01-25 ENCOUNTER — Ambulatory Visit (INDEPENDENT_AMBULATORY_CARE_PROVIDER_SITE_OTHER): Payer: Medicare Other | Admitting: Internal Medicine

## 2020-01-25 VITALS — BP 135/66 | HR 88 | Temp 98.3°F | Ht 63.0 in | Wt 180.0 lb

## 2020-01-25 DIAGNOSIS — H9113 Presbycusis, bilateral: Secondary | ICD-10-CM

## 2020-01-25 DIAGNOSIS — Z23 Encounter for immunization: Secondary | ICD-10-CM

## 2020-01-25 DIAGNOSIS — Z0001 Encounter for general adult medical examination with abnormal findings: Secondary | ICD-10-CM | POA: Diagnosis not present

## 2020-01-25 DIAGNOSIS — M62830 Muscle spasm of back: Secondary | ICD-10-CM

## 2020-01-25 DIAGNOSIS — I10 Essential (primary) hypertension: Secondary | ICD-10-CM | POA: Diagnosis not present

## 2020-01-25 DIAGNOSIS — E1140 Type 2 diabetes mellitus with diabetic neuropathy, unspecified: Secondary | ICD-10-CM

## 2020-01-25 DIAGNOSIS — Z0000 Encounter for general adult medical examination without abnormal findings: Secondary | ICD-10-CM

## 2020-01-25 DIAGNOSIS — D649 Anemia, unspecified: Secondary | ICD-10-CM

## 2020-01-25 LAB — POCT GLYCOSYLATED HEMOGLOBIN (HGB A1C): Hemoglobin A1C: 6.7 % — AB (ref 4.0–5.6)

## 2020-01-25 LAB — GLUCOSE, CAPILLARY: Glucose-Capillary: 146 mg/dL — ABNORMAL HIGH (ref 70–99)

## 2020-01-25 MED ORDER — CYCLOBENZAPRINE HCL 10 MG PO TABS: 10.0000 mg | ORAL_TABLET | Freq: Three times a day (TID) | ORAL | 0 refills | Status: DC | PRN

## 2020-01-25 MED ORDER — CYCLOBENZAPRINE HCL 10 MG PO TABS: 10.0000 mg | ORAL_TABLET | ORAL | 0 refills | Status: DC

## 2020-01-25 MED ORDER — FERROUS SULFATE 325 (65 FE) MG PO TABS: 325.0000 mg | ORAL_TABLET | Freq: Every day | ORAL | 3 refills | Status: DC

## 2020-01-25 MED ORDER — GABAPENTIN 400 MG PO CAPS: 400.0000 mg | ORAL_CAPSULE | Freq: Two times a day (BID) | ORAL | 1 refills | Status: DC

## 2020-01-25 NOTE — Assessment & Plan Note (Signed)
Health maintenance -Covid vaccine done -Flu vaccine today

## 2020-01-25 NOTE — Assessment & Plan Note (Signed)
Type 2 diabetes mellitus: Well-controlled.  Last A1c 6.7%.  A1c today is 6.7%  Plan: -Continue Metformin 500 mg twice daily

## 2020-01-25 NOTE — Progress Notes (Signed)
   CC: Follow-up hypertension, diabetes mellitus  HPI:  Wanda Mclaughlin is a 86 y.o. with medical history listed below presenting to follow-up on chronic medical problems.  Please see problem based charting for further details.  Past Medical History:  Diagnosis Date  . Anemia    Normal EGG, colonoscopy, and Sm. Bowel F/T 2006  . Bilateral hand pain 07/09/2016  . Chronic knee pain   . Chronic renal failure   . Degenerative joint disease   . Diabetes mellitus 11/05   type II  . Diabetic peripheral neuropathy (HCC)   . Duodenitis   . Gastritis   . Health maintenance examination    Mammogram 10/11: Possible mass, right breast. No evidence of malignancy in left breast. Needle biopsy of left breast 12/11: No evidence of  carcinoma. Patient needs follow-up right breast diagnostic mammogram with ultrasound in 6 months.  . History of diabetic gastroparesis 11/28/2017  . Hyperlipidemia   . Hypertension   . Left knee pain 10/06/2017  . Osteoporosis 09/2009   T score -2.8  . Psychophysiological insomnia 11/15/2016  . Vertigo    Review of Systems:  As per HPI  Physical Exam:  Vitals:   01/25/20 1046  BP: (!) 154/72  Pulse: 92  Temp: 98.3 F (36.8 C)  TempSrc: Oral  SpO2: 97%  Weight: 180 lb (81.6 kg)  Height: 5\' 3"  (1.6 m)   Physical Exam Vitals and nursing note reviewed.  HENT:     Ears:     Comments: Hard of hearing Cardiovascular:     Rate and Rhythm: Normal rate.     Heart sounds: Murmur (Systolic murmur) heard.   Pulmonary:     Breath sounds: No rales.  Neurological:     Mental Status: She is alert.  Psychiatric:        Mood and Affect: Mood normal.        Behavior: Behavior normal.     Assessment & Plan:   See Encounters Tab for problem based charting.  Patient discussed with Dr. 

## 2020-01-25 NOTE — Progress Notes (Signed)
Internal Medicine Clinic Attending  Case discussed with Dr. Agyei  At the time of the visit.  We reviewed the resident's history and exam and pertinent patient test results.  I agree with the assessment, diagnosis, and plan of care documented in the resident's note.  

## 2020-01-25 NOTE — Patient Instructions (Signed)
[name],   Thanks for seeing me today.  I am glad you are doing well.  Your diabetes and high blood pressure are also under control.  Please follow-up with your doctor for the hearing aids.  Take care! Dr. Dortha Schwalbe  Please call the internal medicine center clinic if you have any questions or concerns, we may be able to help and keep you from a long and expensive emergency room wait. Our clinic and after hours phone number is [phone], the best time to call is Monday through Friday 9 am to 4 pm but there is always someone available 24/7 if you have an emergency. If you need medication refills please notify your pharmacy one week in advance and they will send [place] a request.

## 2020-01-25 NOTE — Assessment & Plan Note (Signed)
Hypertension:   BP Readings from Last 3 Encounters:  01/25/20 135/66  10/26/19 (!) 145/72  07/12/19 (!) 150/62    Plan: -Continue amlodipine 10 mg daily -Continue losartan-HCTZ 100-25 mg daily

## 2020-01-25 NOTE — Assessment & Plan Note (Signed)
Initial evaluation has been performed and she has follow-up with ENT next month.

## 2020-02-27 ENCOUNTER — Other Ambulatory Visit: Payer: Self-pay | Admitting: Internal Medicine

## 2020-04-09 ENCOUNTER — Other Ambulatory Visit: Payer: Self-pay | Admitting: Internal Medicine

## 2020-04-09 DIAGNOSIS — I10 Essential (primary) hypertension: Secondary | ICD-10-CM

## 2020-04-11 ENCOUNTER — Other Ambulatory Visit: Payer: Self-pay

## 2020-04-11 DIAGNOSIS — M62830 Muscle spasm of back: Secondary | ICD-10-CM

## 2020-04-11 MED ORDER — CYCLOBENZAPRINE HCL 10 MG PO TABS: 10.0000 mg | ORAL_TABLET | ORAL | 0 refills | Status: DC

## 2020-04-18 ENCOUNTER — Encounter: Payer: Self-pay | Admitting: Internal Medicine

## 2020-04-18 ENCOUNTER — Ambulatory Visit (INDEPENDENT_AMBULATORY_CARE_PROVIDER_SITE_OTHER): Payer: Medicare Other | Admitting: Internal Medicine

## 2020-04-18 ENCOUNTER — Other Ambulatory Visit: Payer: Self-pay

## 2020-04-18 VITALS — BP 142/67 | HR 90 | Temp 98.3°F | Ht 63.0 in | Wt 178.4 lb

## 2020-04-18 DIAGNOSIS — Z0000 Encounter for general adult medical examination without abnormal findings: Secondary | ICD-10-CM

## 2020-04-18 DIAGNOSIS — E1140 Type 2 diabetes mellitus with diabetic neuropathy, unspecified: Secondary | ICD-10-CM

## 2020-04-18 DIAGNOSIS — N1832 Chronic kidney disease, stage 3b: Secondary | ICD-10-CM | POA: Diagnosis not present

## 2020-04-18 DIAGNOSIS — I10 Essential (primary) hypertension: Secondary | ICD-10-CM

## 2020-04-18 NOTE — Assessment & Plan Note (Signed)
Hypertension: Goal <150/90  BP Readings from Last 3 Encounters:  04/18/20 (!) 142/67  01/25/20 135/66  10/26/19 (!) 145/72   Plan: -Continue amlodipine 10 mg daily -Continue losartan-HCTZ 100-25 mg daily -Follow BMP

## 2020-04-18 NOTE — Progress Notes (Signed)
Internal Medicine Clinic Attending  Case discussed with Dr. Agyei at the time of the visit.  We reviewed the resident's history and exam and pertinent patient test results.  I agree with the assessment, diagnosis, and plan of care documented in the resident's note.  Mats Jeanlouis, M.D., Ph.D.  

## 2020-04-18 NOTE — Assessment & Plan Note (Signed)
Up to date

## 2020-04-18 NOTE — Assessment & Plan Note (Signed)
CKD stage III: Follow BMP today

## 2020-04-18 NOTE — Assessment & Plan Note (Signed)
Diabetes mellitus: Last 2 A1c's have been unremarkable at 6.7%.  Her home CBG metrics shows fasting glucose between 120s-150s.  Average glucose is 136 mg/dL.  Lowest is 118.  She did bring up the possibility of stopping her Metformin however advised that I would probably wait and recheck A1c at her next follow-up appointment.  Plan: -Continue Metformin 500 mg daily -We will have the patient centered discussion during her next visit to inquire if she would like to stay on Metformin

## 2020-04-18 NOTE — Patient Instructions (Signed)
[name],  It was a pleasure taking care of you here in the clinic today.  I am going to check blood work for your kidneys today.  You can stop checking your blood sugar.  We will see you back in the clinic in 3 months.  I hope you are able to get your hearing aids.  Take care! Dr. Dortha Schwalbe  Please call the internal medicine center clinic if you have any questions or concerns, we may be able to help and keep you from a long and expensive emergency room wait. Our clinic and after hours phone number is [phone], the best time to call is Monday through Friday 9 am to 4 pm but there is always someone available 24/7 if you have an emergency. If you need medication refills please notify your pharmacy one week in advance and they will send [place] a request.   If you have not gotten the COVID vaccine, I recommend doing so:  You may get it at your local [facility] or [facility] OR To schedule an appointment for a COVID vaccine or be added to the vaccine wait list: Go to [email or web address]   OR Go to [email or web address]                  OR Call [phone]                                     OR Call [phone] and select Option 2

## 2020-04-18 NOTE — Progress Notes (Signed)
   CC: Follow-up diabetes mellitus, hypertension  HPI:  Wanda Mclaughlin is a 86 y.o. with medical history listed below presented to follow-up on chronic medical problems.  Past Medical History:  Diagnosis Date  . Anemia    Normal EGG, colonoscopy, and Sm. Bowel F/T 2006  . Bilateral hand pain 07/09/2016  . Chronic knee pain   . Chronic renal failure   . Degenerative joint disease   . Diabetes mellitus 11/05   type II  . Diabetic peripheral neuropathy (HCC)   . Duodenitis   . Gastritis   . Health maintenance examination    Mammogram 10/11: Possible mass, right breast. No evidence of malignancy in left breast. Needle biopsy of left breast 12/11: No evidence of  carcinoma. Patient needs follow-up right breast diagnostic mammogram with ultrasound in 6 months.  . History of diabetic gastroparesis 11/28/2017  . Hyperlipidemia   . Hypertension   . Left knee pain 10/06/2017  . Osteoporosis 09/2009   T score -2.8  . Psychophysiological insomnia 11/15/2016  . Vertigo    Review of Systems:  As per HPI  Physical Exam:  Vitals:   04/18/20 0944 04/18/20 0946  BP: (!) 147/67 (!) 142/67  Pulse: 90   Temp: 98.3 F (36.8 C)   TempSrc: Oral   SpO2: 96%   Weight: 178 lb 6.4 oz (80.9 kg)   Height: 5\' 3"  (1.6 m)    Physical Exam Constitutional:      Comments: Hard of hearing  Cardiovascular:     Rate and Rhythm: Regular rhythm.     Heart sounds: Normal heart sounds.  Pulmonary:     Effort: No respiratory distress.     Breath sounds: Normal breath sounds.  Musculoskeletal:     Cervical back: Neck supple.  Psychiatric:        Mood and Affect: Mood normal.        Behavior: Behavior normal.     Assessment & Plan:   See Encounters Tab for problem based charting.  Patient discussed with Dr. 

## 2020-04-19 LAB — BMP8+ANION GAP
Anion Gap: 15 mmol/L (ref 10.0–18.0)
BUN/Creatinine Ratio: 13 (ref 12–28)
BUN: 17 mg/dL (ref 8–27)
CO2: 22 mmol/L (ref 20–29)
Calcium: 9 mg/dL (ref 8.7–10.3)
Chloride: 103 mmol/L (ref 96–106)
Creatinine, Ser: 1.3 mg/dL — ABNORMAL HIGH (ref 0.57–1.00)
GFR calc Af Amer: 42 mL/min/{1.73_m2} — ABNORMAL LOW (ref >59)
GFR calc non Af Amer: 36 mL/min/{1.73_m2} — ABNORMAL LOW (ref >59)
Glucose: 124 mg/dL — ABNORMAL HIGH (ref 65–99)
Potassium: 3.6 mmol/L (ref 3.5–5.2)
Sodium: 140 mmol/L (ref 134–144)

## 2020-04-26 ENCOUNTER — Other Ambulatory Visit: Payer: Self-pay | Admitting: Internal Medicine

## 2020-04-26 DIAGNOSIS — Z0000 Encounter for general adult medical examination without abnormal findings: Secondary | ICD-10-CM

## 2020-05-02 ENCOUNTER — Other Ambulatory Visit: Payer: Self-pay | Admitting: Student

## 2020-05-02 DIAGNOSIS — I10 Essential (primary) hypertension: Secondary | ICD-10-CM

## 2020-05-30 ENCOUNTER — Other Ambulatory Visit: Payer: Self-pay | Admitting: Student

## 2020-05-30 DIAGNOSIS — I10 Essential (primary) hypertension: Secondary | ICD-10-CM

## 2020-06-05 ENCOUNTER — Other Ambulatory Visit: Payer: Self-pay | Admitting: *Deleted

## 2020-06-05 DIAGNOSIS — I10 Essential (primary) hypertension: Secondary | ICD-10-CM

## 2020-06-05 MED ORDER — LOSARTAN POTASSIUM-HCTZ 100-25 MG PO TABS: 1.0000 | ORAL_TABLET | Freq: Every day | ORAL | 1 refills | Status: DC

## 2020-06-18 ENCOUNTER — Other Ambulatory Visit: Payer: Self-pay | Admitting: Internal Medicine

## 2020-06-18 DIAGNOSIS — M62830 Muscle spasm of back: Secondary | ICD-10-CM

## 2020-07-08 ENCOUNTER — Other Ambulatory Visit: Payer: Self-pay | Admitting: Internal Medicine

## 2020-07-08 DIAGNOSIS — E1140 Type 2 diabetes mellitus with diabetic neuropathy, unspecified: Secondary | ICD-10-CM

## 2020-07-17 ENCOUNTER — Telehealth: Payer: Self-pay

## 2020-07-17 NOTE — Telephone Encounter (Signed)
I don't see any phone call record from the chart either.

## 2020-07-17 NOTE — Telephone Encounter (Signed)
Pls contact pt daughter [phone]

## 2020-07-17 NOTE — Telephone Encounter (Signed)
Return call to pt's daughter - she stated someone had called her mother and did not understand what she was told. No note in chart; I do not know who called her. I will send to the yellow team to see they called her.

## 2020-07-19 ENCOUNTER — Telehealth: Payer: Self-pay

## 2020-07-19 NOTE — Telephone Encounter (Signed)
Daughter is requesting a call back

## 2020-07-19 NOTE — Telephone Encounter (Signed)
RTC, daughter states her mother is still getting messages on her phone that someone from [facility] is trying to get in touch with her.   Per chart review, daughter called about this on 3/21, message was sent to yellow team, and MD responded he did not see any phone notes.  This RN does not see in phone notes.   RN recommended that the patient save the next message and have daughter listen to message.   SChaplin, RN,BSN

## 2020-08-07 ENCOUNTER — Other Ambulatory Visit: Payer: Self-pay | Admitting: Internal Medicine

## 2020-08-07 DIAGNOSIS — E1140 Type 2 diabetes mellitus with diabetic neuropathy, unspecified: Secondary | ICD-10-CM

## 2020-08-07 DIAGNOSIS — M62830 Muscle spasm of back: Secondary | ICD-10-CM

## 2020-08-17 ENCOUNTER — Encounter: Payer: Self-pay | Admitting: Internal Medicine

## 2020-08-17 ENCOUNTER — Ambulatory Visit (INDEPENDENT_AMBULATORY_CARE_PROVIDER_SITE_OTHER): Payer: Medicare Other | Admitting: Internal Medicine

## 2020-08-17 ENCOUNTER — Other Ambulatory Visit: Payer: Self-pay

## 2020-08-17 VITALS — BP 136/78 | HR 107 | Temp 99.2°F | Ht 63.0 in | Wt 171.6 lb

## 2020-08-17 DIAGNOSIS — M1180 Other specified crystal arthropathies, unspecified site: Secondary | ICD-10-CM | POA: Diagnosis not present

## 2020-08-17 DIAGNOSIS — Z634 Disappearance and death of family member: Secondary | ICD-10-CM

## 2020-08-17 DIAGNOSIS — E1140 Type 2 diabetes mellitus with diabetic neuropathy, unspecified: Secondary | ICD-10-CM | POA: Diagnosis not present

## 2020-08-17 DIAGNOSIS — H9113 Presbycusis, bilateral: Secondary | ICD-10-CM | POA: Diagnosis not present

## 2020-08-17 DIAGNOSIS — M170 Bilateral primary osteoarthritis of knee: Secondary | ICD-10-CM

## 2020-08-17 LAB — GLUCOSE, CAPILLARY: Glucose-Capillary: 126 mg/dL — ABNORMAL HIGH (ref 70–99)

## 2020-08-17 LAB — POCT GLYCOSYLATED HEMOGLOBIN (HGB A1C): Hemoglobin A1C: 6.3 % — AB (ref 4.0–5.6)

## 2020-08-17 MED ORDER — DICLOFENAC SODIUM 1 % EX GEL: 2.0000 g | Freq: Four times a day (QID) | CUTANEOUS | 1 refills | Status: DC

## 2020-08-17 NOTE — Patient Instructions (Addendum)
Thank you for allowing [place] to provide your care today. Today we discussed your knee pain    I have ordered hemoglobin a1c labs for you. I will call if any are abnormal.    Today we made the following changes to your medications.    Please use voltaren gel four times daily for knee pain  Please follow-up in 3 months.    Should you have any questions or concerns please call the internal medicine clinic at [phone].     Chronic Knee Pain, Adult Knee pain that lasts longer than 3 months is called chronic knee pain. You may have pain in one or both knees. Symptoms of chronic knee pain may also include swelling and stiffness. The most common cause is age-related wear and tear (osteoarthritis) of your knee joint. Many conditions can cause chronic knee pain. Treatment depends on the cause. The main treatments are physical therapy and weight loss. It may also be treated with medicines, injections, a knee sleeve or brace, and by using crutches. Rest, ice, pressure (compression), and elevation, also known as RICE therapy, may also be recommended. Follow these instructions at home: If you have a knee sleeve or brace:  Wear the knee sleeve or brace as told by your doctor. Take it off only as told by your doctor.  Loosen it if your toes: ? Tingle. ? Become numb. ? Turn cold and blue.  Keep it clean.  If the sleeve or brace is not waterproof: ? Do not let it get wet. ? Ask your doctor if you may take it off when you take a bath or shower. If not, cover it with a watertight covering.   Managing pain, stiffness, and swelling  If told, put heat on your knee. Do this as often as told by your doctor. Use the heat source that your doctor recommends, such as a moist heat pack or a heating pad. ? If you have a removable knee sleeve or brace, take it off as told by your doctor. ? Place a towel between your skin and the heat source. ? Leave the heat on for 20-30 minutes. ? Take off the heat if your  skin turns bright red. This is very important. If you cannot feel pain, heat, or cold, you have a greater risk of getting burned.  If told, put ice on your knee. To do this: ? If you have a removable knee sleeve or brace, take it off as told by your doctor. ? Put ice in a plastic bag. ? Place a towel between your skin and the bag. ? Leave the ice on for 20 minutes, 2-3 times a day. ? Take off the ice if your skin turns bright red. This is very important. If you cannot feel pain, heat, or cold, you have a greater risk of damage to the area.  Move your toes often.  Raise the injured area above the level of your heart while you are sitting or lying down.      Activity  Avoid activities where both feet leave the ground at the same time (high-impact activities). Examples are running, jumping rope, and doing jumping jacks.  Follow the exercise plan that your doctor makes for you. Your doctor may suggest that you: ? Avoid activities that make knee pain worse. You may need to change the exercises that you do, the sports that you participate in, or your job duties. ? Wear shoes with cushioned soles. ? Avoid sports that require running and sudden  changes in direction. ? Do exercises or physical therapy. This is planned to match your needs and your abilities. ? Do exercises that increase your balance and strength, such as tai chi and yoga.  Do not use your injured knee to support your body weight until your doctor says that you can. Use crutches as told by your doctor.  Return to your normal activities when your doctor says that it is safe. General instructions  Take over-the-counter and prescription medicines only as told by your doctor.  If you are overweight, work with your doctor and a food expert (dietitian) to set goals to lose weight. Being overweight can make your knee hurt more.  Do not smoke or use any products that contain nicotine or tobacco. If you need help quitting, ask your  doctor.  Keep all follow-up visits. Contact a doctor if:  You have knee pain that is not getting better or gets worse.  You are not able to do your exercises due to knee pain. Get help right away if:  Your knee swells and the swelling gets worse.  You cannot move your knee.  You have very bad knee pain. Summary  Knee pain that lasts more than 3 months is called chronic knee pain.  The main treatments for chronic knee pain are physical therapy and weight loss. You may also need to take medicines, wear a knee sleeve or brace, use crutches, and put ice or heat on your knee.  Lose weight if you are overweight. Work with your doctor and a food expert (dietitian) to help you set goals to lose weight. Being overweight can make your knee hurt more.  Follow the exercise plan that your doctor makes for you. This information is not intended to replace advice given to you by your health care provider. Make sure you discuss any questions you have with your health care provider. Document Revised: 09/29/2019 Document Reviewed: 09/29/2019 Elsevier Patient Education  2021 [employer].

## 2020-08-17 NOTE — Progress Notes (Signed)
CC: Knee pain  HPI: [name] is a 87 y.o. with PMH listed below presenting with complaint of knee pain. Please see problem based assessment and plan for further details.  Past Medical History:  Diagnosis Date  . Anemia    Normal EGG, colonoscopy, and Sm. Bowel F/T 2006  . Bilateral hand pain 07/09/2016  . Chronic knee pain   . Chronic renal failure   . Degenerative joint disease   . Diabetes mellitus 11/05   type II  . Diabetic peripheral neuropathy (HCC)   . Duodenitis   . Gastritis   . Health maintenance examination    Mammogram 10/11: Possible mass, right breast. No evidence of malignancy in left breast. Needle biopsy of left breast 12/11: No evidence of  carcinoma. Patient needs follow-up right breast diagnostic mammogram with ultrasound in 6 months.  . History of diabetic gastroparesis 11/28/2017  . Hyperlipidemia   . Hypertension   . Left knee pain 10/06/2017  . Osteoporosis 09/2009   T score -2.8  . Psychophysiological insomnia 11/15/2016  . Vertigo    Review of Systems: Review of Systems  Constitutional: Positive for malaise/fatigue. Negative for chills and fever.  Eyes: Negative for blurred vision.  Respiratory: Negative for shortness of breath.   Cardiovascular: Positive for leg swelling. Negative for chest pain and palpitations.  Gastrointestinal: Negative for constipation, diarrhea, nausea and vomiting.  Musculoskeletal: Positive for back pain and joint pain.  Neurological: Negative for dizziness and headaches.  Psychiatric/Behavioral: Positive for depression. Negative for suicidal ideas. The patient is not nervous/anxious.     Physical Exam: Vitals:   08/17/20 1016 08/17/20 1058  BP: (!) 133/111 136/78  Pulse: (!) 107   Temp: 99.2 F (37.3 C)   TempSrc: Oral   SpO2: 100%   Weight: 171 lb 9.6 oz (77.8 kg)   Height: 5\' 3"  (1.6 m)    Gen: Well-developed, well nourished, NAD HEENT: NCAT head, significantly diminished hearing bilaterally although R  worse than L CV: RRR, S1, S2 normal Pulm: CTAB, No rales, no wheezes Extm: ROM intact, Peripheral pulses intact, Trace ankle pitting edema, crepitus on flexion of bilateral knees, no obvious effusion, erythema, or warmth Skin: Dry, Warm, normal turgor Psych: Sad affect  Assessment & Plan:   Diabetes mellitus with neuropathy (HCC) Lab Results  Component Value Date   HGBA1C 6.3 (A) 08/17/2020   At goal hgb a1c. Denies significant GI side effects to current Metformin dose. Denies nausea, vomiting, diarrhea, polyuria, polydipsia, polyphagia. Mentions loss of appetite due to family member passing recently.  - C/w metformin 500mg  daily  Presbycusis of both ears She mentions that she had followed up with audiology and was suppose to get a hearing aid but was found to be too expensive as the provider is out-of-network. She is currently in the process of finding a different audiologist for her hearing aid as she is unable to afford her hearing aid.  A/P Significantly diminished hearing. Needs hearing aid - F/u w/ audiology  Calcium pyrophosphate crystal arthritis + osteoarthritis Presents w/ complaint of chronic knee pain. Previously diagnosed w/ combinatioin of pseudogout and osteoarthritis. Mentions since passing of her brother last weekend, she has been having worsening pain of both knees. Does not endorse any fevers, chills, nausea, vomiting. Denies any loss of range of motion or new weakness.  A/P Chronic knee pain 2/2 pseudogout and osteoarthritis. No obvious effusion noted on this visit. Home medicine reviewed and voltaren gel missing from medicine bag. Discussed importance of  regular exercise and offered referral to physical therapy, which she is agreeable to - Physical therapy referral - Voltaren gel refilled - Can RTC for steroid injection if pain persists  Bereavement Wanda Mclaughlin mentions that her brother passed last weekend. She has been endorsing sadness, depressed mood, fatigue,  exacerbation of her chronic pain and loss of appetite. She mentions that she lives alone currently. Denies any Si/Hi. Mentions visiting her daughter sometimes for social support.  A/P Provided condolences. Offered grief counseling but mentions that she will rely on her daughter for social support instead. - Monitor   Patient discussed with Dr. Heide Spark  -Wanda Mclaughlin, PGY3 [facility] Internal Medicine Pager: [phone]

## 2020-08-18 ENCOUNTER — Encounter: Payer: Self-pay | Admitting: Internal Medicine

## 2020-08-18 DIAGNOSIS — Z634 Disappearance and death of family member: Secondary | ICD-10-CM

## 2020-08-18 HISTORY — DX: Disappearance and death of family member: Z63.4

## 2020-08-18 NOTE — Assessment & Plan Note (Addendum)
Lab Results  Component Value Date   HGBA1C 6.3 (A) 08/17/2020   At goal hgb a1c. Denies significant GI side effects to current Metformin dose. Denies nausea, vomiting, diarrhea, polyuria, polydipsia, polyphagia. Mentions loss of appetite due to family member passing recently.  - C/w metformin 500mg  daily

## 2020-08-18 NOTE — Assessment & Plan Note (Signed)
Wanda Mclaughlin mentions that her brother passed last weekend. She has been endorsing sadness, depressed mood, fatigue, exacerbation of her chronic pain and loss of appetite. She mentions that she lives alone currently. Denies any Si/Hi. Mentions visiting her daughter sometimes for social support.  A/P Provided condolences. Offered grief counseling but mentions that she will rely on her daughter for social support instead. - Monitor

## 2020-08-18 NOTE — Assessment & Plan Note (Signed)
She mentions that she had followed up with audiology and was suppose to get a hearing aid but was found to be too expensive as the provider is out-of-network. She is currently in the process of finding a different audiologist for her hearing aid as she is unable to afford her hearing aid.  A/P Significantly diminished hearing. Needs hearing aid - F/u w/ audiology

## 2020-08-18 NOTE — Assessment & Plan Note (Addendum)
Presents w/ complaint of chronic knee pain. Previously diagnosed w/ combinatioin of pseudogout and osteoarthritis. Mentions since passing of her brother last weekend, she has been having worsening pain of both knees. Does not endorse any fevers, chills, nausea, vomiting. Denies any loss of range of motion or new weakness.  A/P Chronic knee pain 2/2 pseudogout and osteoarthritis. No obvious effusion noted on this visit. Home medicine reviewed and voltaren gel missing from medicine bag. Discussed importance of regular exercise and offered referral to physical therapy, which she is agreeable to - Physical therapy referral - Voltaren gel refilled - Can RTC for steroid injection if pain persists

## 2020-08-18 NOTE — Progress Notes (Signed)
Internal Medicine Clinic Attending  Case discussed with Dr. Lee  At the time of the visit.  We reviewed the resident's history and exam and pertinent patient test results.  I agree with the assessment, diagnosis, and plan of care documented in the resident's note.    

## 2020-08-30 DIAGNOSIS — H905 Unspecified sensorineural hearing loss: Secondary | ICD-10-CM | POA: Diagnosis not present

## 2020-09-06 ENCOUNTER — Encounter: Payer: Self-pay | Admitting: *Deleted

## 2020-09-20 ENCOUNTER — Encounter: Payer: Self-pay | Admitting: *Deleted

## 2020-10-03 ENCOUNTER — Ambulatory Visit: Payer: Medicare Other | Attending: Internal Medicine | Admitting: Physical Therapy

## 2020-10-03 ENCOUNTER — Other Ambulatory Visit: Payer: Self-pay

## 2020-10-03 DIAGNOSIS — M25662 Stiffness of left knee, not elsewhere classified: Secondary | ICD-10-CM | POA: Diagnosis not present

## 2020-10-03 DIAGNOSIS — R262 Difficulty in walking, not elsewhere classified: Secondary | ICD-10-CM

## 2020-10-03 DIAGNOSIS — R2689 Other abnormalities of gait and mobility: Secondary | ICD-10-CM

## 2020-10-03 DIAGNOSIS — M6281 Muscle weakness (generalized): Secondary | ICD-10-CM | POA: Diagnosis not present

## 2020-10-03 DIAGNOSIS — M25661 Stiffness of right knee, not elsewhere classified: Secondary | ICD-10-CM | POA: Diagnosis not present

## 2020-10-03 DIAGNOSIS — M25561 Pain in right knee: Secondary | ICD-10-CM | POA: Insufficient documentation

## 2020-10-03 DIAGNOSIS — M25562 Pain in left knee: Secondary | ICD-10-CM | POA: Diagnosis not present

## 2020-10-03 DIAGNOSIS — G8929 Other chronic pain: Secondary | ICD-10-CM | POA: Diagnosis not present

## 2020-10-03 NOTE — Therapy (Signed)
[facility] [address] [place], [place], [address] Phone: [phone]   Fax:  [phone]  Physical Therapy Evaluation  Patient Details  Name: Wanda Mclaughlin MRN: [id number] Date of Birth: [birth year] Referring Provider (PT): Earl Lagos   Encounter Date: 10/03/2020   PT End of Session - 10/03/20 0938    Visit Number 1    Number of Visits 12    Date for PT Re-Evaluation 11/14/20    Authorization Type UHC - Medicare    Progress Note Due on Visit 10    PT Start Time [phone]   Late arrival   PT Stop Time 1015    PT Time Calculation (min) 37 min    Activity Tolerance Patient tolerated treatment well    Behavior During Therapy [facility] for tasks assessed/performed           Past Medical History:  Diagnosis Date  . Anemia    Normal EGG, colonoscopy, and Sm. Bowel F/T 2006  . Bilateral hand pain 07/09/2016  . Chronic knee pain   . Chronic renal failure   . Degenerative joint disease   . Diabetes mellitus 11/05   type II  . Diabetic peripheral neuropathy (HCC)   . Duodenitis   . Gastritis   . Health maintenance examination    Mammogram 10/11: Possible mass, right breast. No evidence of malignancy in left breast. Needle biopsy of left breast 12/11: No evidence of  carcinoma. Patient needs follow-up right breast diagnostic mammogram with ultrasound in 6 months.  . History of diabetic gastroparesis 11/28/2017  . Hyperlipidemia   . Hypertension   . Left knee pain 10/06/2017  . Osteoporosis 09/2009   T score -2.8  . Psychophysiological insomnia 11/15/2016  . Vertigo     Past Surgical History:  Procedure Laterality Date  .  Right needle localized lumpectomy.   12/11   Path report:FOCAL ATYPICAL DUCTAL HYPERPLASIA IN A BACKGROUND OF STROMAL  FIBROSIS. No evidence of carcinoma.   . ABDOMINAL HYSTERECTOMY     1980's (Fibroids)  . COLONOSCOPY     8/11 by Dr. Christella Hartigan: Normal colon, Given your age, you will not need another  colonoscopy for colon cancer screening or polyp surveillance    There were no vitals filed for this visit.    Subjective Assessment - 10/03/20 0941    Subjective Pt reports issues with her hands and knees. Both hands are numb. Pt believes her knees has arthritis which causes pain (including back).    Patient is accompained by: Family member    How long can you sit comfortably? n/a    How long can you stand comfortably? 5-10 minutes before discomfort (feels in back).    How long can you walk comfortably? <50'    Patient Stated Goals Walk and stand more; use hands better for cooking    Currently in Pain? Yes    Pain Score 7    faces scale   Pain Location Knee    Pain Orientation Right;Left   L usually hurts more than R   Pain Descriptors / Indicators Dull;Sore    Pain Type Chronic pain    Pain Onset More than a month ago    Pain Frequency Constant    Aggravating Factors  Walking, standing    Pain Relieving Factors Topical lotion    Effect of Pain on Daily Activities Difficulty with home mobility    Multiple Pain Sites Yes    Pain Score 7    Pain Location  Back    Pain Orientation Lower;Mid    Pain Descriptors / Indicators Aching;Dull;Sore    Pain Type Chronic pain    Pain Onset More than a month ago    Pain Frequency Constant              [facility] PT Assessment - 10/03/20 0001      Assessment   Medical Diagnosis M11.80 (ICD-10-CM) - Calcium pyrophosphate crystal arthritis    Referring Provider (PT) Narendra, Nischal    Onset Date/Surgical Date --   ~3 years ago   Hand Dominance Right    Prior Therapy ~3 years ago (knee)      Precautions   Precautions Fall    Precaution Comments HOH      Restrictions   Weight Bearing Restrictions No      Balance Screen   Has the patient fallen in the past 6 months Yes    How many times? 1   Rolled out of bed   Has the patient had a decrease in activity level because of a fear of falling?  No    Is the patient reluctant to leave  their home because of a fear of falling?  No      Home Environment   Living Environment Private residence    Living Arrangements Alone    Available Help at Discharge Family    Type of Home Apartment    Home Access Level entry    Home Layout One level    Home Equipment [place] - single point      Prior Function   Level of Independence Independent   However, family drives   Vocation Retired      IT consultantCognition   Overall Cognitive Status Within Functional Limits for tasks assessed      Observation/Other Assessments   Focus on Therapeutic Outcomes (FOTO)  To be assessed next visit      ROM / Strength   AROM / PROM / Strength AROM;Strength      AROM   AROM Assessment Site Knee    Right/Left Knee Right;Left    Right Knee Extension 0    Right Knee Flexion 90    Left Knee Extension -7    Left Knee Flexion 95      Strength   Strength Assessment Site Knee;Hip    Right/Left Hip Right;Left    Right Hip Flexion 4+/5    Right Hip Extension 3+/5    Right Hip External Rotation  4-/5    Right Hip Internal Rotation 4+/5    Right Hip ABduction 3/5    Left Hip Flexion 4+/5    Left Hip Extension 3+/5    Left Hip External Rotation 4-/5    Left Hip Internal Rotation 4+/5    Left Hip ABduction 4+/5    Right/Left Knee Right;Left    Right Knee Flexion 4+/5    Right Knee Extension 5/5    Left Knee Flexion 4-/5    Left Knee Extension 4/5                      Objective measurements completed on examination: See above findings.               PT Education - 10/03/20 1311    Education Details Exam findings, POC. Discussed a hand referral and OT    Person(s) Educated Patient;Child(ren)    Methods Explanation;Demonstration;Verbal cues;Handout    Comprehension Verbalized understanding;Returned demonstration;Verbal cues required  PT Short Term Goals - 10/03/20 1312      PT SHORT TERM GOAL #1   Title Pt will have her balance assessed in the next 1-2 visits for  falls safety and appropriate goal created    Time 2    Period Weeks    Status New    Target Date 10/17/20      PT SHORT TERM GOAL #2   Title PT will have pt perform FOTO and create appropriate goal in the next 1-2 visits    Time 2    Period Weeks    Status New    Target Date 10/17/20             PT Long Term Goals - 10/03/20 1316      PT LONG TERM GOAL #1   Title Pt will be independent with HEP    Time 6    Period Weeks    Status New    Target Date 11/14/20      PT LONG TERM GOAL #2   Title Pt will improve bilat knee ROM by at least 5 deg    Baseline On R: 0 to 90 deg; on L: 7 to 95    Time 6    Period Weeks    Status New    Target Date 11/14/20      PT LONG TERM GOAL #3   Title Pt will be able to stand and walk for at least 15 minutes for improved home tasks    Time 6    Period Weeks    Status New    Target Date 11/14/20                  Plan - 10/03/20 1143    Clinical Impression Statement Ms. Helmuth is an 87 y/o F presenting to OPPT for bilat knee pain with c/o additional LBP. Pt with concurrent complaints of hand numbness and decreased function -- discussed with pt and daughter that she will need another referral and OT evaluation. On assessment, pt demos L>R knee weakness and bilat hip extensor weakness with decreased bilat knee ROM (L less than R). Pt would benefit from therapy to address her knee pain to opitimize her level of function with mobility and safe independent living. Family questioning if pt can receive HHPT due to not always being able to give pt a ride -- discussed with daughter that MD will have to send a referral to home health.    Personal Factors and Comorbidities Age;Past/Current Experience;Fitness;Transportation    Examination-Activity Limitations Bed Mobility;Stand;Squat;Locomotion Level;Hygiene/Grooming    Examination-Participation Restrictions Cleaning;Laundry;Community Activity;Shop    Stability/Clinical Decision Making  Evolving/Moderate complexity    Clinical Decision Making Moderate    Rehab Potential Good    PT Frequency 2x / week    PT Duration 6 weeks    PT Treatment/Interventions ADLs/Self Care Home Management;Aquatic Therapy;Cryotherapy;Biofeedback;Electrical Stimulation;Iontophoresis 4mg /ml Dexamethasone;Moist Heat;Ultrasound;DME Instruction;Gait training;Stair training;Functional mobility training;Therapeutic activities;Neuromuscular re-education;Balance training;Therapeutic exercise;Patient/family education;Manual techniques;Passive range of motion;Dry needling;Energy conservation;Taping;Vasopneumatic Device;Joint Manipulations;Spinal Manipulations    PT Next Visit Plan Assess response to HEP. Please assess 5x STS, walking endurance, balance, and FOTO (did not have time on initial eval). Continue to work on knee ROM and strengthening.    PT Home Exercise Plan Access Code [email or web address]    Consulted and Agree with Plan of Care Patient           Patient will benefit from skilled therapeutic intervention in order to improve the following deficits and  impairments:  Abnormal gait,Decreased mobility,Difficulty walking,Hypomobility,Impaired sensation,Decreased range of motion,Improper body mechanics,Decreased activity tolerance,Decreased strength,Increased fascial restricitons,Impaired flexibility,Pain  Visit Diagnosis: Muscle weakness (generalized)  Difficulty in walking, not elsewhere classified  Other abnormalities of gait and mobility  Chronic pain of left knee  Chronic pain of right knee  Stiffness of left knee, not elsewhere classified  Stiffness of right knee, not elsewhere classified     Problem List Patient Active Problem List   Diagnosis Date Noted  . Bereavement 08/18/2020  . Spasm of back muscles 11/28/2017  . CKD (chronic kidney disease) stage 3, GFR 30-59 ml/min (HCC) 03/29/2016  . Presbycusis of both ears 03/12/2016  . Calcium pyrophosphate crystal arthritis + osteoarthritis  09/05/2015  . Health care maintenance 03/15/2013  . Chronic anemia 10/20/2006  . Diabetes mellitus with neuropathy (HCC) 02/13/2006  . Hyperlipidemia 02/13/2006  . Essential hypertension 02/13/2006    [facility] April Ma L Mittie Knittel PT, DPT 10/03/2020, 1:19 PM  [facility] [address] [place], [place], [address] Phone: [phone]   Fax:  [phone]  Name: OAKLEE ESTHER MRN: [id number] Date of Birth: 02-09-1929

## 2020-10-03 NOTE — Patient Instructions (Signed)
Access Code: [email or web address] URL: [email or web address] Date: 10/03/2020 Prepared by: Vernon Prey April Kirstie Peri  Exercises Supine Active Straight Leg Raise - 1 x daily - 7 x weekly - 2 sets - 10 reps Supine Bridge - 1 x daily - 7 x weekly - 2 sets - 10 reps

## 2020-10-10 ENCOUNTER — Ambulatory Visit: Payer: Medicare Other | Admitting: Physical Therapy

## 2020-10-10 ENCOUNTER — Other Ambulatory Visit: Payer: Self-pay

## 2020-10-10 DIAGNOSIS — R2689 Other abnormalities of gait and mobility: Secondary | ICD-10-CM | POA: Diagnosis not present

## 2020-10-10 DIAGNOSIS — M25562 Pain in left knee: Secondary | ICD-10-CM | POA: Diagnosis not present

## 2020-10-10 DIAGNOSIS — M25661 Stiffness of right knee, not elsewhere classified: Secondary | ICD-10-CM | POA: Diagnosis not present

## 2020-10-10 DIAGNOSIS — M25662 Stiffness of left knee, not elsewhere classified: Secondary | ICD-10-CM | POA: Diagnosis not present

## 2020-10-10 DIAGNOSIS — M6281 Muscle weakness (generalized): Secondary | ICD-10-CM | POA: Diagnosis not present

## 2020-10-10 DIAGNOSIS — M25561 Pain in right knee: Secondary | ICD-10-CM | POA: Diagnosis not present

## 2020-10-10 DIAGNOSIS — G8929 Other chronic pain: Secondary | ICD-10-CM | POA: Diagnosis not present

## 2020-10-10 DIAGNOSIS — R262 Difficulty in walking, not elsewhere classified: Secondary | ICD-10-CM | POA: Diagnosis not present

## 2020-10-10 NOTE — Therapy (Signed)
[facility] [address] [place], [place], [address] Phone: [phone]   Fax:  [phone]  Physical Therapy Treatment  Patient Details  Name: Wanda Mclaughlin MRN: [id number] Date of Birth: 1929-03-23 Referring Provider (PT): Earl Lagos   Encounter Date: 10/10/2020   PT End of Session - 10/10/20 1024     Visit Number 2    Number of Visits 12    Date for PT Re-Evaluation 11/14/20    Authorization Type UHC - Medicare    Progress Note Due on Visit 10    PT Start Time 1017    PT Stop Time 1100    PT Time Calculation (min) 43 min    Activity Tolerance Patient tolerated treatment well    Behavior During Therapy [facility] for tasks assessed/performed             Past Medical History:  Diagnosis Date   Anemia    Normal EGG, colonoscopy, and Sm. Bowel F/T 2006   Bilateral hand pain 07/09/2016   Chronic knee pain    Chronic renal failure    Degenerative joint disease    Diabetes mellitus 11/05   type II   Diabetic peripheral neuropathy (HCC)    Duodenitis    Gastritis    Health maintenance examination    Mammogram 10/11: Possible mass, right breast. No evidence of malignancy in left breast. Needle biopsy of left breast 12/11: No evidence of  carcinoma. Patient needs follow-up right breast diagnostic mammogram with ultrasound in 6 months.   History of diabetic gastroparesis 11/28/2017   Hyperlipidemia    Hypertension    Left knee pain 10/06/2017   Osteoporosis 09/2009   T score -2.8   Psychophysiological insomnia 11/15/2016   Vertigo     Past Surgical History:  Procedure Laterality Date    Right needle localized lumpectomy.   12/11   Path report:FOCAL ATYPICAL DUCTAL HYPERPLASIA IN A BACKGROUND OF STROMAL  FIBROSIS. No evidence of carcinoma.    ABDOMINAL HYSTERECTOMY     1980's (Fibroids)   COLONOSCOPY     8/11 by Dr. Christella Hartigan: Normal colon, Given your age, you will not need another colonoscopy for colon cancer  screening or polyp surveillance    There were no vitals filed for this visit.   Subjective Assessment - 10/10/20 1021     Subjective Pt states she was able to do some of the exercises.    Patient is accompained by: Family member    How long can you sit comfortably? n/a    How long can you stand comfortably? 5-10 minutes before discomfort (feels in back).    How long can you walk comfortably? <50'    Patient Stated Goals Walk and stand more; use hands better for cooking    Currently in Pain? Yes    Pain Score 7     Pain Location Knee    Pain Orientation Right;Left    Pain Onset More than a month ago    Pain Onset More than a month ago                [facility] PT Assessment - 10/10/20 0001       Standardized Balance Assessment   Standardized Balance Assessment Timed Up and Go Test;Five Times Sit to Stand    Five times sit to stand comments  16   with UE use     Timed Up and Go Test   Normal TUG (seconds) 17  [facility] Adult PT Treatment/Exercise - 10/10/20 0001       Exercises   Exercises Knee/Hip      Knee/Hip Exercises: Aerobic   Nustep L3 x 5 min UEs/LEs      Knee/Hip Exercises: Seated   Long Arc Quad Strengthening;Both;2 sets;10 reps    Long Arc Quad Limitations red tband    Heel Slides AAROM;Left;Right;2 sets;10 reps   end range stretch   Clamshell with TheraBand Red   2x10   Marching Strengthening;Both;2 sets;10 reps    Hamstring Curl Strengthening;Both;2 sets;10 reps    Hamstring Limitations red tband    Sit to [employer] 2 sets;5 reps      Knee/Hip Exercises: Supine   Bridges Strengthening;Both;2 sets;10 reps    Straight Leg Raises Strengthening;2 sets;10 reps      Manual Therapy   Manual Therapy Joint mobilization;Passive ROM    Joint Mobilization bilat patellar mobilizations in all directions    Passive ROM bilat knee flexion with end range stretch                      PT Short Term Goals - 10/10/20  1059       PT SHORT TERM GOAL #1   Title Pt will have improved 5x STS to <13 sec to demo decreased fall risk and improved functional strength    Baseline 16 sec    Time 3    Period Weeks    Status New    Target Date 10/31/20      PT SHORT TERM GOAL #2   Title PT will have pt perform FOTO and create appropriate goal in the next 1-2 visits    Time 2    Period Weeks    Status On-going    Target Date 10/17/20               PT Long Term Goals - 10/10/20 1059       PT LONG TERM GOAL #1   Title Pt will be independent with HEP    Time 6    Period Weeks    Status New      PT LONG TERM GOAL #2   Title Pt will improve bilat knee ROM by at least 5 deg    Baseline On R: 0 to 90 deg; on L: 7 to 95    Time 6    Period Weeks    Status New      PT LONG TERM GOAL #3   Title Pt will be able to stand and walk for at least 15 minutes for improved home tasks    Time 6    Period Weeks    Status New      PT LONG TERM GOAL #4   Title Pt will have improved TUG score to <13 sec to demo decreased fall risk    Baseline 17 sec    Status New    Target Date 11/14/20                   Plan - 10/10/20 1050     Clinical Impression Statement Treatment focused on improving knee ROM with manual therapy/stretching and strengthening. Progressed hip and knee exercises in seated. Reviewed HEP. Pt tolerated treatment well. Performed 5x STS and TUG -- pt demos mod/high fall risk. Reported "knee feels better" at end of session.    Personal Factors and Comorbidities Age;Past/Current Experience;Fitness;Transportation    Examination-Activity Limitations Bed Mobility;Stand;Squat;Locomotion Level;Hygiene/Grooming    Examination-Participation  Restrictions Cleaning;Laundry;Community Activity;Shop    Stability/Clinical Decision Making Evolving/Moderate complexity    Rehab Potential Good    PT Frequency 2x / week    PT Duration 6 weeks    PT Treatment/Interventions ADLs/Self Care Home  Management;Aquatic Therapy;Cryotherapy;Biofeedback;Electrical Stimulation;Iontophoresis 4mg /ml Dexamethasone;Moist Heat;Ultrasound;DME Instruction;Gait training;Stair training;Functional mobility training;Therapeutic activities;Neuromuscular re-education;Balance training;Therapeutic exercise;Patient/family education;Manual techniques;Passive range of motion;Dry needling;Energy conservation;Taping;Vasopneumatic Device;Joint Manipulations;Spinal Manipulations    PT Next Visit Plan Assess response to HEP. Please assess FOTO.Continue to work on knee ROM and strengthening.    PT Home Exercise Plan Access Code [email or web address]    Consulted and Agree with Plan of Care Patient             Patient will benefit from skilled therapeutic intervention in order to improve the following deficits and impairments:  Abnormal gait, Decreased mobility, Difficulty walking, Hypomobility, Impaired sensation, Decreased range of motion, Improper body mechanics, Decreased activity tolerance, Decreased strength, Increased fascial restricitons, Impaired flexibility, Pain  Visit Diagnosis: Muscle weakness (generalized)  Difficulty in walking, not elsewhere classified  Other abnormalities of gait and mobility  Chronic pain of left knee  Chronic pain of right knee  Stiffness of left knee, not elsewhere classified  Stiffness of right knee, not elsewhere classified     Problem List Patient Active Problem List   Diagnosis Date Noted   Bereavement 08/18/2020   Spasm of back muscles 11/28/2017   CKD (chronic kidney disease) stage 3, GFR 30-59 ml/min (HCC) 03/29/2016   Presbycusis of both ears 03/12/2016   Calcium pyrophosphate crystal arthritis + osteoarthritis 09/05/2015   Health care maintenance 03/15/2013   Chronic anemia 10/20/2006   Diabetes mellitus with neuropathy (HCC) 02/13/2006   Hyperlipidemia 02/13/2006   Essential hypertension 02/13/2006    Kyle Stansell April Ma L Jomarion Mish PT, DPT 10/10/2020, 11:03 AM  [facility] [address] [place], [place], [place] Phone: [phone]   Fax:  [phone]  Name: Wanda Mclaughlin MRN: [name] Date of Birth: Sep 17, 1928

## 2020-10-12 ENCOUNTER — Ambulatory Visit: Payer: Medicare Other | Admitting: Physical Therapy

## 2020-10-12 ENCOUNTER — Other Ambulatory Visit: Payer: Self-pay

## 2020-10-12 DIAGNOSIS — M25661 Stiffness of right knee, not elsewhere classified: Secondary | ICD-10-CM

## 2020-10-12 DIAGNOSIS — M25561 Pain in right knee: Secondary | ICD-10-CM | POA: Diagnosis not present

## 2020-10-12 DIAGNOSIS — M25562 Pain in left knee: Secondary | ICD-10-CM | POA: Diagnosis not present

## 2020-10-12 DIAGNOSIS — M6281 Muscle weakness (generalized): Secondary | ICD-10-CM

## 2020-10-12 DIAGNOSIS — M25662 Stiffness of left knee, not elsewhere classified: Secondary | ICD-10-CM | POA: Diagnosis not present

## 2020-10-12 DIAGNOSIS — G8929 Other chronic pain: Secondary | ICD-10-CM | POA: Diagnosis not present

## 2020-10-12 DIAGNOSIS — R262 Difficulty in walking, not elsewhere classified: Secondary | ICD-10-CM | POA: Diagnosis not present

## 2020-10-12 DIAGNOSIS — R2689 Other abnormalities of gait and mobility: Secondary | ICD-10-CM | POA: Diagnosis not present

## 2020-10-12 NOTE — Therapy (Signed)
[facility] [address] [place], [place], [address] Phone: [phone]   Fax:  [phone]  Physical Therapy Treatment  Patient Details  Name: Wanda Mclaughlin MRN: [id number] Date of Birth: July 13, 1928 Referring Provider (PT): Earl Lagos   Encounter Date: 10/12/2020   PT End of Session - 10/12/20 1040     Visit Number 3    Number of Visits 12    Date for PT Re-Evaluation 11/14/20    Authorization Type UHC - Medicare    Progress Note Due on Visit 10    PT Start Time 1015    PT Stop Time 1100    PT Time Calculation (min) 45 min    Activity Tolerance Patient tolerated treatment well    Behavior During Therapy [facility] for tasks assessed/performed             Past Medical History:  Diagnosis Date   Anemia    Normal EGG, colonoscopy, and Sm. Bowel F/T 2006   Bilateral hand pain 07/09/2016   Chronic knee pain    Chronic renal failure    Degenerative joint disease    Diabetes mellitus 11/05   type II   Diabetic peripheral neuropathy (HCC)    Duodenitis    Gastritis    Health maintenance examination    Mammogram 10/11: Possible mass, right breast. No evidence of malignancy in left breast. Needle biopsy of left breast 12/11: No evidence of  carcinoma. Patient needs follow-up right breast diagnostic mammogram with ultrasound in 6 months.   History of diabetic gastroparesis 11/28/2017   Hyperlipidemia    Hypertension    Left knee pain 10/06/2017   Osteoporosis 09/2009   T score -2.8   Psychophysiological insomnia 11/15/2016   Vertigo     Past Surgical History:  Procedure Laterality Date    Right needle localized lumpectomy.   12/11   Path report:FOCAL ATYPICAL DUCTAL HYPERPLASIA IN A BACKGROUND OF STROMAL  FIBROSIS. No evidence of carcinoma.    ABDOMINAL HYSTERECTOMY     1980's (Fibroids)   COLONOSCOPY     8/11 by Dr. Christella Hartigan: Normal colon, Given your age, you will not need another colonoscopy for colon cancer  screening or polyp surveillance    There were no vitals filed for this visit.   Subjective Assessment - 10/12/20 1020     Subjective Pt reports not feeling so good today. Pt went to hearing aid doctor and got some hearing aids -- it has helped her a lot. Pt states she did some exercises. Pt states she was a little sore after therapy last session. Pt requesting to come down to 1x/wk.    Patient is accompained by: Family member    How long can you sit comfortably? n/a    How long can you stand comfortably? 5-10 minutes before discomfort (feels in back).    How long can you walk comfortably? <50'    Patient Stated Goals Walk and stand more; use hands better for cooking    Currently in Pain? Yes    Pain Location Knee    Pain Orientation Left    Pain Type Chronic pain    Pain Onset More than a month ago    Pain Onset More than a month ago                [facility] PT Assessment - 10/12/20 0001       Observation/Other Assessments   Focus on Therapeutic Outcomes (FOTO)  38; predicted 53  [facility] Adult PT Treatment/Exercise - 10/12/20 0001       Knee/Hip Exercises: Stretches   Passive Hamstring Stretch Both;30 seconds    Gastroc Stretch Both;2 reps;30 seconds   first in sup then in standing     Knee/Hip Exercises: Aerobic   Nustep L5 x 6 min UEs/LEs      Knee/Hip Exercises: Seated   Heel Slides AAROM;Left;Right;2 sets;10 reps      Knee/Hip Exercises: Supine   Quad Sets Strengthening;Both;2 sets;10 reps    Heel Slides Strengthening;2 sets;10 reps    Bridges Strengthening;Both;2 sets;10 reps    Straight Leg Raises Strengthening;2 sets;10 reps    Other Supine Knee/Hip Exercises Clam 2x10 red band      Knee/Hip Exercises: Sidelying   Clams --      Manual Therapy   Joint Mobilization bilat patellar mobilizations in all directions; bilat PA talocrural mobs for DF    Passive ROM bilat knee flexion with end range stretch; bilat ankle into  DF                      PT Short Term Goals - 10/10/20 1059       PT SHORT TERM GOAL #1   Title Pt will have improved 5x STS to <13 sec to demo decreased fall risk and improved functional strength    Baseline 16 sec    Time 3    Period Weeks    Status New    Target Date 10/31/20      PT SHORT TERM GOAL #2   Title PT will have pt perform FOTO and create appropriate goal in the next 1-2 visits    Time 2    Period Weeks    Status On-going    Target Date 10/17/20               PT Long Term Goals - 10/10/20 1059       PT LONG TERM GOAL #1   Title Pt will be independent with HEP    Time 6    Period Weeks    Status New      PT LONG TERM GOAL #2   Title Pt will improve bilat knee ROM by at least 5 deg    Baseline On R: 0 to 90 deg; on L: 7 to 95    Time 6    Period Weeks    Status New      PT LONG TERM GOAL #3   Title Pt will be able to stand and walk for at least 15 minutes for improved home tasks    Time 6    Period Weeks    Status New      PT LONG TERM GOAL #4   Title Pt will have improved TUG score to <13 sec to demo decreased fall risk    Baseline 17 sec    Status New    Target Date 11/14/20                   Plan - 10/12/20 1051     Clinical Impression Statement Pt found to have very stiff ankles into DF. Provided manual and stretching as indicated as this may be increasing pessure in pt's knees. Continued to work on knee ROM and strengthening in supine. Pt can likely progress to standing exercises next session. Pt request to come down to 1x/wk.    Personal Factors and Comorbidities Age;Past/Current Experience;Fitness;Transportation    Examination-Activity Limitations Bed  Mobility;Stand;Squat;Locomotion Level;Hygiene/Grooming    Examination-Participation Restrictions Cleaning;Laundry;Community Activity;Shop    Stability/Clinical Decision Making Evolving/Moderate complexity    Rehab Potential Good    PT Frequency 1x / week    PT  Duration 6 weeks    PT Treatment/Interventions ADLs/Self Care Home Management;Aquatic Therapy;Cryotherapy;Biofeedback;Electrical Stimulation;Iontophoresis 4mg /ml Dexamethasone;Moist Heat;Ultrasound;DME Instruction;Gait training;Stair training;Functional mobility training;Therapeutic activities;Neuromuscular re-education;Balance training;Therapeutic exercise;Patient/family education;Manual techniques;Passive range of motion;Dry needling;Energy conservation;Taping;Vasopneumatic Device;Joint Manipulations;Spinal Manipulations    PT Next Visit Plan Assess response to HEP. Continue to work on knee ROM and strengthening.    PT Home Exercise Plan Access Code [email or web address]    Consulted and Agree with Plan of Care Patient;Family member/caregiver    Family Member Consulted Daughter             Patient will benefit from skilled therapeutic intervention in order to improve the following deficits and impairments:  Abnormal gait, Decreased mobility, Difficulty walking, Hypomobility, Impaired sensation, Decreased range of motion, Improper body mechanics, Decreased activity tolerance, Decreased strength, Increased fascial restricitons, Impaired flexibility, Pain  Visit Diagnosis: Muscle weakness (generalized)  Other abnormalities of gait and mobility  Difficulty in walking, not elsewhere classified  Chronic pain of left knee  Chronic pain of right knee  Stiffness of left knee, not elsewhere classified  Stiffness of right knee, not elsewhere classified     Problem List Patient Active Problem List   Diagnosis Date Noted   Bereavement 08/18/2020   Spasm of back muscles 11/28/2017   CKD (chronic kidney disease) stage 3, GFR 30-59 ml/min (HCC) 03/29/2016   Presbycusis of both ears 03/12/2016   Calcium pyrophosphate crystal arthritis + osteoarthritis 09/05/2015   Health care maintenance 03/15/2013   Chronic anemia 10/20/2006   Diabetes mellitus with neuropathy (HCC) 02/13/2006   Hyperlipidemia  02/13/2006   Essential hypertension 02/13/2006    John Williamsen April Ma L Dennard Vezina PT, DPT 10/12/2020, 11:19 AM  [facility] [address] [place], [place], [place] Phone: [phone]   Fax:  [phone]  Name: Wanda Mclaughlin MRN: [name] Date of Birth: [birth year]

## 2020-10-16 ENCOUNTER — Other Ambulatory Visit: Payer: Self-pay | Admitting: Internal Medicine

## 2020-10-16 DIAGNOSIS — M62830 Muscle spasm of back: Secondary | ICD-10-CM

## 2020-10-17 ENCOUNTER — Encounter: Payer: Medicare Other | Admitting: Physical Therapy

## 2020-10-19 ENCOUNTER — Other Ambulatory Visit: Payer: Self-pay

## 2020-10-19 ENCOUNTER — Ambulatory Visit: Payer: Medicare Other | Admitting: Physical Therapy

## 2020-10-19 ENCOUNTER — Encounter: Payer: Self-pay | Admitting: Physical Therapy

## 2020-10-19 DIAGNOSIS — R2689 Other abnormalities of gait and mobility: Secondary | ICD-10-CM

## 2020-10-19 DIAGNOSIS — G8929 Other chronic pain: Secondary | ICD-10-CM

## 2020-10-19 DIAGNOSIS — M25562 Pain in left knee: Secondary | ICD-10-CM

## 2020-10-19 DIAGNOSIS — M6281 Muscle weakness (generalized): Secondary | ICD-10-CM

## 2020-10-19 DIAGNOSIS — M25662 Stiffness of left knee, not elsewhere classified: Secondary | ICD-10-CM | POA: Diagnosis not present

## 2020-10-19 DIAGNOSIS — R262 Difficulty in walking, not elsewhere classified: Secondary | ICD-10-CM | POA: Diagnosis not present

## 2020-10-19 DIAGNOSIS — M25661 Stiffness of right knee, not elsewhere classified: Secondary | ICD-10-CM | POA: Diagnosis not present

## 2020-10-19 DIAGNOSIS — M25561 Pain in right knee: Secondary | ICD-10-CM | POA: Diagnosis not present

## 2020-10-19 NOTE — Therapy (Signed)
[facility] [address] [place], [place], [address] Phone: [phone]   Fax:  [phone]  Physical Therapy Treatment  Patient Details  Name: Wanda Mclaughlin MRN: [id number] Date of Birth: Oct 06, 1928 Referring Provider (PT): Earl Lagos   Encounter Date: 10/19/2020   PT End of Session - 10/19/20 1041     Visit Number 4    Number of Visits 12    Date for PT Re-Evaluation 11/14/20    Authorization Type UHC - Medicare    Progress Note Due on Visit 10    PT Start Time 1045    PT Stop Time 1128    PT Time Calculation (min) 43 min    Activity Tolerance Patient tolerated treatment well    Behavior During Therapy [facility] for tasks assessed/performed             Past Medical History:  Diagnosis Date   Anemia    Normal EGG, colonoscopy, and Sm. Bowel F/T 2006   Bilateral hand pain 07/09/2016   Chronic knee pain    Chronic renal failure    Degenerative joint disease    Diabetes mellitus 11/05   type II   Diabetic peripheral neuropathy (HCC)    Duodenitis    Gastritis    Health maintenance examination    Mammogram 10/11: Possible mass, right breast. No evidence of malignancy in left breast. Needle biopsy of left breast 12/11: No evidence of  carcinoma. Patient needs follow-up right breast diagnostic mammogram with ultrasound in 6 months.   History of diabetic gastroparesis 11/28/2017   Hyperlipidemia    Hypertension    Left knee pain 10/06/2017   Osteoporosis 09/2009   T score -2.8   Psychophysiological insomnia 11/15/2016   Vertigo     Past Surgical History:  Procedure Laterality Date    Right needle localized lumpectomy.   12/11   Path report:FOCAL ATYPICAL DUCTAL HYPERPLASIA IN A BACKGROUND OF STROMAL  FIBROSIS. No evidence of carcinoma.    ABDOMINAL HYSTERECTOMY     1980's (Fibroids)   COLONOSCOPY     8/11 by Dr. Christella Hartigan: Normal colon, Given your age, you will not need another colonoscopy for colon cancer  screening or polyp surveillance    There were no vitals filed for this visit.   Subjective Assessment - 10/19/20 1041     Subjective Pt reports that her knees are sore today. She reports that the exercises have been helpful.  She reports HEP compliance.    Patient is accompained by: Family member    How long can you sit comfortably? n/a    How long can you stand comfortably? 5-10 minutes before discomfort (feels in back).    How long can you walk comfortably? <50'    Patient Stated Goals Walk and stand more; use hands better for cooking    Currently in Pain? Yes    Pain Score 7     Pain Location Knee    Pain Orientation Left    Pain Onset More than a month ago    Pain Onset More than a month ago                               [facility] Adult PT Treatment/Exercise - 10/19/20 0001       Knee/Hip Exercises: Supine   Quad Sets Limitations with towel - 2x10 ea    Short Arc Quad Sets Limitations 3# - 3x10  Bridges Limitations 2x10    Straight Leg Raises Limitations 3x10 ea    Other Supine Knee/Hip Exercises Clam 2x10 green band      Manual Therapy   Manual therapy comments knee distraction, pt in sitting; then in hooklying (flexion/distraction)               s       PT Short Term Goals - 10/10/20 1059       PT SHORT TERM GOAL #1   Title Pt will have improved 5x STS to <13 sec to demo decreased fall risk and improved functional strength    Baseline 16 sec    Time 3    Period Weeks    Status New    Target Date 10/31/20      PT SHORT TERM GOAL #2   Title PT will have pt perform FOTO and create appropriate goal in the next 1-2 visits    Time 2    Period Weeks    Status On-going    Target Date 10/17/20               PT Long Term Goals - 10/10/20 1059       PT LONG TERM GOAL #1   Title Pt will be independent with HEP    Time 6    Period Weeks    Status New      PT LONG TERM GOAL #2   Title Pt will improve bilat knee ROM by at  least 5 deg    Baseline On R: 0 to 90 deg; on L: 7 to 95    Time 6    Period Weeks    Status New      PT LONG TERM GOAL #3   Title Pt will be able to stand and walk for at least 15 minutes for improved home tasks    Time 6    Period Weeks    Status New      PT LONG TERM GOAL #4   Title Pt will have improved TUG score to <13 sec to demo decreased fall risk    Baseline 17 sec    Status New    Target Date 11/14/20                   Plan - 10/19/20 1125     Clinical Impression Statement KALEN NEIDERT is progressing fair with therapy.  Today we concentrated on lower extremity strengthening and quad strengthening.  Pt reports relief with knee distraction techniques.  She requires considerable cuing for quad contraction with quad sets, but is able to achieve with practice.  Pt reports mild pain reduction following therapy.  Next session we will continue current course, progressing as able/appropriate.  HEP was reviewed with patient, but left unchanged.  Pt will continue to benefit from skilled physical therapy to address remaining deficits and achieve listed goals.  Continue per POC.    Personal Factors and Comorbidities Age;Past/Current Experience;Fitness;Transportation    Examination-Activity Limitations Bed Mobility;Stand;Squat;Locomotion Level;Hygiene/Grooming    Examination-Participation Restrictions Cleaning;Laundry;Community Activity;Shop    Stability/Clinical Decision Making Evolving/Moderate complexity    Rehab Potential Good    PT Frequency 1x / week    PT Duration 6 weeks    PT Treatment/Interventions ADLs/Self Care Home Management;Aquatic Therapy;Cryotherapy;Biofeedback;Electrical Stimulation;Iontophoresis 4mg /ml Dexamethasone;Moist Heat;Ultrasound;DME Instruction;Gait training;Stair training;Functional mobility training;Therapeutic activities;Neuromuscular re-education;Balance training;Therapeutic exercise;Patient/family education;Manual techniques;Passive range of  motion;Dry needling;Energy conservation;Taping;Vasopneumatic Device;Joint Manipulations;Spinal Manipulations    PT Next Visit Plan Assess response  to HEP. Continue to work on knee ROM and strengthening.    PT Home Exercise Plan Access Code [email or web address]    Consulted and Agree with Plan of Care Patient;Family member/caregiver    Family Member Consulted Daughter             Patient will benefit from skilled therapeutic intervention in order to improve the following deficits and impairments:  Abnormal gait, Decreased mobility, Difficulty walking, Hypomobility, Impaired sensation, Decreased range of motion, Improper body mechanics, Decreased activity tolerance, Decreased strength, Increased fascial restricitons, Impaired flexibility, Pain  Visit Diagnosis: Muscle weakness (generalized)  Other abnormalities of gait and mobility  Difficulty in walking, not elsewhere classified  Chronic pain of left knee     Problem List Patient Active Problem List   Diagnosis Date Noted   Bereavement 08/18/2020   Spasm of back muscles 11/28/2017   CKD (chronic kidney disease) stage 3, GFR 30-59 ml/min (HCC) 03/29/2016   Presbycusis of both ears 03/12/2016   Calcium pyrophosphate crystal arthritis + osteoarthritis 09/05/2015   Health care maintenance 03/15/2013   Chronic anemia 10/20/2006   Diabetes mellitus with neuropathy (HCC) 02/13/2006   Hyperlipidemia 02/13/2006   Essential hypertension 02/13/2006    Alphonzo Severance PT, DPT 10/19/20 11:28 AM   [facility] [address] [place], [place], [address] Phone: [phone]   Fax:  [phone]  Name: TAPANGA OTTAWAY MRN: [id number] Date of Birth: January 15, 1929

## 2020-10-24 ENCOUNTER — Other Ambulatory Visit: Payer: Self-pay

## 2020-10-24 ENCOUNTER — Ambulatory Visit: Payer: Medicare Other | Admitting: Physical Therapy

## 2020-10-24 ENCOUNTER — Encounter: Payer: Self-pay | Admitting: Physical Therapy

## 2020-10-24 DIAGNOSIS — R262 Difficulty in walking, not elsewhere classified: Secondary | ICD-10-CM | POA: Diagnosis not present

## 2020-10-24 DIAGNOSIS — R2689 Other abnormalities of gait and mobility: Secondary | ICD-10-CM | POA: Diagnosis not present

## 2020-10-24 DIAGNOSIS — G8929 Other chronic pain: Secondary | ICD-10-CM | POA: Diagnosis not present

## 2020-10-24 DIAGNOSIS — M25661 Stiffness of right knee, not elsewhere classified: Secondary | ICD-10-CM

## 2020-10-24 DIAGNOSIS — M6281 Muscle weakness (generalized): Secondary | ICD-10-CM | POA: Diagnosis not present

## 2020-10-24 DIAGNOSIS — M25562 Pain in left knee: Secondary | ICD-10-CM | POA: Diagnosis not present

## 2020-10-24 DIAGNOSIS — M25561 Pain in right knee: Secondary | ICD-10-CM | POA: Diagnosis not present

## 2020-10-24 DIAGNOSIS — M25662 Stiffness of left knee, not elsewhere classified: Secondary | ICD-10-CM

## 2020-10-24 NOTE — Therapy (Signed)
[facility] [address] [place], [place], [address] Phone: [phone]   Fax:  [phone]  Physical Therapy Treatment  Patient Details  Name: Wanda Mclaughlin MRN: [id number] Date of Birth: 02/25/1929 Referring Provider (PT): Wanda Mclaughlin   Encounter Date: 10/24/2020   PT End of Session - 10/24/20 1002     Visit Number 5    Number of Visits 12    Date for PT Re-Evaluation 11/14/20    Authorization Type UHC - Medicare    Progress Note Due on Visit 10    PT Start Time 1000    PT Stop Time 1043    PT Time Calculation (min) 43 min    Activity Tolerance Patient tolerated treatment well    Behavior During Therapy [id number] for tasks assessed/performed             Past Medical History:  Diagnosis Date   Anemia    Normal EGG, colonoscopy, and Sm. Bowel F/T 2006   Bilateral hand pain 07/09/2016   Chronic knee pain    Chronic renal failure    Degenerative joint disease    Diabetes mellitus 11/05   type II   Diabetic peripheral neuropathy (HCC)    Duodenitis    Gastritis    Health maintenance examination    Mammogram 10/11: Possible mass, right breast. No evidence of malignancy in left breast. Needle biopsy of left breast 12/11: No evidence of  carcinoma. Patient needs follow-up right breast diagnostic mammogram with ultrasound in 6 months.   History of diabetic gastroparesis 11/28/2017   Hyperlipidemia    Hypertension    Left knee pain 10/06/2017   Osteoporosis 09/2009   T score -2.8   Psychophysiological insomnia 11/15/2016   Vertigo     Past Surgical History:  Procedure Laterality Date    Right needle localized lumpectomy.   12/11   Path report:FOCAL ATYPICAL DUCTAL HYPERPLASIA IN A BACKGROUND OF STROMAL  FIBROSIS. No evidence of carcinoma.    ABDOMINAL HYSTERECTOMY     1980's (Fibroids)   COLONOSCOPY     8/11 by Dr. Christella Hartigan: Normal colon, Given your age, you will not need another colonoscopy for colon cancer  screening or polyp surveillance    There were no vitals filed for this visit.   Subjective Assessment - 10/24/20 1006     Subjective Pt reports that she is doing pretty well today.  She reports a moderate amount of pain.  She feels the pain is improving.    Patient is accompained by: Family member    How long can you sit comfortably? n/a    How long can you stand comfortably? 5-10 minutes before discomfort (feels in back).    How long can you walk comfortably? <50'    Patient Stated Goals Walk and stand more; use hands better for cooking    Pain Score 6     Pain Location Knee    Pain Onset More than a month ago    Pain Onset More than a month ago                               [facility] Adult PT Treatment/Exercise - 10/24/20 0001       Knee/Hip Exercises: Aerobic   Nustep L5 x 6 min UEs/LEs      Knee/Hip Exercises: Supine   Quad Sets Limitations with towel - x10 ea    Short Arc Quad Sets Limitations  5# - 3x10    Bridges Limitations 3x10 with 5# sustained OH press    Straight Leg Raises Limitations 4x10 ea    Other Supine Knee/Hip Exercises Clam 3x10 green band      Manual Therapy   Manual therapy comments knee distraction, pt in sitting; then in hooklying (flexion/distraction)                      PT Short Term Goals - 10/10/20 1059       PT SHORT TERM GOAL #1   Title Pt will have improved 5x STS to <13 sec to demo decreased fall risk and improved functional strength    Baseline 16 sec    Time 3    Period Weeks    Status New    Target Date 10/31/20      PT SHORT TERM GOAL #2   Title PT will have pt perform FOTO and create appropriate goal in the next 1-2 visits    Time 2    Period Weeks    Status On-going    Target Date 10/17/20               PT Long Term Goals - 10/10/20 1059       PT LONG TERM GOAL #1   Title Pt will be independent with HEP    Time 6    Period Weeks    Status New      PT LONG TERM GOAL #2   Title Pt  will improve bilat knee ROM by at least 5 deg    Baseline On R: 0 to 90 deg; on L: 7 to 95    Time 6    Period Weeks    Status New      PT LONG TERM GOAL #3   Title Pt will be able to stand and walk for at least 15 minutes for improved home tasks    Time 6    Period Weeks    Status New      PT LONG TERM GOAL #4   Title Pt will have improved TUG score to <13 sec to demo decreased fall risk    Baseline 17 sec    Status New    Target Date 11/14/20                   Plan - 10/24/20 1009     Clinical Impression Statement Wanda Mclaughlin is progressing well with therapy.  Today we concentrated on pain reduction and LE strengthening.  Pt is able to progress mat exercises and seems to be doing well with isometrics and limited range open chain exercises.  Pt reports mild pain reduction following therapy.  Next session we will continue with current course.  HEP was reviewed with patient, but left unchanged.  Pt will continue to benefit from skilled physical therapy to address remaining deficits and achieve listed goals.  Continue per POC.    Personal Factors and Comorbidities Age;Past/Current Experience;Fitness;Transportation    Examination-Activity Limitations Bed Mobility;Stand;Squat;Locomotion Level;Hygiene/Grooming    Examination-Participation Restrictions Cleaning;Laundry;Community Activity;Shop    Stability/Clinical Decision Making Evolving/Moderate complexity    Rehab Potential Good    PT Frequency 1x / week    PT Duration 6 weeks    PT Treatment/Interventions ADLs/Self Care Home Management;Aquatic Therapy;Cryotherapy;Biofeedback;Electrical Stimulation;Iontophoresis 4mg /ml Dexamethasone;Moist Heat;Ultrasound;DME Instruction;Gait training;Stair training;Functional mobility training;Therapeutic activities;Neuromuscular re-education;Balance training;Therapeutic exercise;Patient/family education;Manual techniques;Passive range of motion;Dry needling;Energy  conservation;Taping;Vasopneumatic Device;Joint Manipulations;Spinal Manipulations    PT Next  Visit Plan Assess response to HEP. Continue to work on knee ROM and strengthening.    PT Home Exercise Plan Access Code [email or web address]    Consulted and Agree with Plan of Care Patient;Family member/caregiver    Family Member Consulted Daughter             Patient will benefit from skilled therapeutic intervention in order to improve the following deficits and impairments:  Abnormal gait, Decreased mobility, Difficulty walking, Hypomobility, Impaired sensation, Decreased range of motion, Improper body mechanics, Decreased activity tolerance, Decreased strength, Increased fascial restricitons, Impaired flexibility, Pain  Visit Diagnosis: Muscle weakness (generalized)  Other abnormalities of gait and mobility  Difficulty in walking, not elsewhere classified  Chronic pain of left knee  Chronic pain of right knee  Stiffness of left knee, not elsewhere classified  Stiffness of right knee, not elsewhere classified     Problem List Patient Active Problem List   Diagnosis Date Noted   Bereavement 08/18/2020   Spasm of back muscles 11/28/2017   CKD (chronic kidney disease) stage 3, GFR 30-59 ml/min (HCC) 03/29/2016   Presbycusis of both ears 03/12/2016   Calcium pyrophosphate crystal arthritis + osteoarthritis 09/05/2015   Health care maintenance 03/15/2013   Chronic anemia 10/20/2006   Diabetes mellitus with neuropathy (HCC) 02/13/2006   Hyperlipidemia 02/13/2006   Essential hypertension 02/13/2006    Alphonzo Severance PT, DPT 10/24/20 10:44 AM   [facility] [address] [place], [place], [address] Phone: [phone]   Fax:  [phone]  Name: Wanda Mclaughlin MRN: [id number] Date of Birth: 24-Feb-1929

## 2020-10-26 ENCOUNTER — Encounter: Payer: Medicare Other | Admitting: Physical Therapy

## 2020-10-31 ENCOUNTER — Ambulatory Visit: Payer: Medicare Other | Attending: Internal Medicine | Admitting: Physical Therapy

## 2020-10-31 ENCOUNTER — Other Ambulatory Visit: Payer: Self-pay

## 2020-10-31 ENCOUNTER — Encounter: Payer: Self-pay | Admitting: Physical Therapy

## 2020-10-31 DIAGNOSIS — M25561 Pain in right knee: Secondary | ICD-10-CM | POA: Diagnosis not present

## 2020-10-31 DIAGNOSIS — M25562 Pain in left knee: Secondary | ICD-10-CM | POA: Diagnosis not present

## 2020-10-31 DIAGNOSIS — G8929 Other chronic pain: Secondary | ICD-10-CM | POA: Insufficient documentation

## 2020-10-31 DIAGNOSIS — M25662 Stiffness of left knee, not elsewhere classified: Secondary | ICD-10-CM | POA: Insufficient documentation

## 2020-10-31 DIAGNOSIS — M6281 Muscle weakness (generalized): Secondary | ICD-10-CM | POA: Insufficient documentation

## 2020-10-31 DIAGNOSIS — R2689 Other abnormalities of gait and mobility: Secondary | ICD-10-CM | POA: Diagnosis not present

## 2020-10-31 DIAGNOSIS — M25661 Stiffness of right knee, not elsewhere classified: Secondary | ICD-10-CM | POA: Insufficient documentation

## 2020-10-31 DIAGNOSIS — R262 Difficulty in walking, not elsewhere classified: Secondary | ICD-10-CM | POA: Insufficient documentation

## 2020-10-31 NOTE — Therapy (Signed)
[place], [place], [address] Phone: [phone]   Fax:  [phone]  Physical Therapy Treatment  Patient Details  Name: Wanda Mclaughlin MRN: [id number] Date of Birth: Jan 13, 1929 Referring Provider (PT): Aldine Contes   Encounter Date: 10/31/2020   PT End of Session - 10/31/20 1026     Visit Number 6    Number of Visits 12    Date for PT Re-Evaluation 11/14/20    Authorization Type UHC - Medicare, FOTO (intake captured on third visit)    PT Start Time 1015    PT Stop Time 1100    PT Time Calculation (min) 45 min             Past Medical History:  Diagnosis Date   Anemia    Normal EGG, colonoscopy, and Sm. Bowel F/T 2006   Bilateral hand pain 07/09/2016   Chronic knee pain    Chronic renal failure    Degenerative joint disease    Diabetes mellitus 11/05   type II   Diabetic peripheral neuropathy ([place])    Duodenitis    Gastritis    Health maintenance examination    Mammogram 10/11: Possible mass, right breast. No evidence of malignancy in left breast. Needle biopsy of left breast 12/11: No evidence of  carcinoma. Patient needs follow-up right breast diagnostic mammogram with ultrasound in 6 months.   History of diabetic gastroparesis 11/28/2017   Hyperlipidemia    Hypertension    Left knee pain 10/06/2017   Osteoporosis 09/2009   T score -2.8   Psychophysiological insomnia 11/15/2016   Vertigo     Past Surgical History:  Procedure Laterality Date    Right needle localized lumpectomy.   12/11   Path report:FOCAL ATYPICAL DUCTAL HYPERPLASIA IN A BACKGROUND OF STROMAL  FIBROSIS. No evidence of carcinoma.    ABDOMINAL HYSTERECTOMY     1980's (Fibroids)   COLONOSCOPY     8/11 by Dr. Ardis Hughs: Normal colon, Given your age, you will not need another colonoscopy for colon cancer screening or polyp surveillance    There were no vitals filed for this visit.   Subjective Assessment - 10/31/20  1017     Subjective I am tired. I have a little pain in my knees today.  No back pain yet.    Currently in Pain? Yes    Pain Score 6     Pain Location Knee    Pain Orientation Left;Right    Pain Descriptors / Indicators Aching    Pain Type Chronic pain    Aggravating Factors  walking , standing    Pain Relieving Factors topical lotion    Pain Score 0    Pain Location Back                [facility] PT Assessment - 10/31/20 0001       AROM   Right Knee Flexion 100    Left Knee Flexion 98      Standardized Balance Assessment   Five times sit to stand comments  20.4   without UE                          [facility] Adult PT Treatment/Exercise - 10/31/20 0001       Knee/Hip Exercises: Stretches   Active Hamstring Stretch Limitations seated EOM x 2 each      Knee/Hip Exercises: Aerobic   Nustep L5 x 6 min UEs/LEs  Knee/Hip Exercises: Seated   Long Arc Quad Strengthening;Both;2 sets;10 reps    Long Arc Quad Weight 0 lbs.    Long Arc [employer] Limitations Green theraband    [employer] Strengthening;Both;2 sets;10 reps    Hamstring Curl Strengthening;Both;2 sets;10 reps    Hamstring Limitations red tband    Sit to [employer] 10 reps      Knee/Hip Exercises: Supine   Straight Leg Raises Limitations 3x10 ea    Other Supine Knee/Hip Exercises Clam 3x10 blue TB                      PT Short Term Goals - 10/31/20 1024       PT SHORT TERM GOAL #1   Title Pt will have improved 5x STS to <13 sec to demo decreased fall risk and improved functional strength    Baseline 16 sec with UE on eval,      20.4 sec without UE 10/31/20    Time 3    Period Weeks    Status On-going    Target Date 10/31/20      PT SHORT TERM GOAL #2   Title PT will have pt perform FOTO and create appropriate goal in the next 1-2 visits    Baseline intake captured on third visit, need to set goal    Time 2    Period Weeks    Status Partially Met    Target Date 10/17/20                PT Long Term Goals - 10/10/20 1059       PT LONG TERM GOAL #1   Title Pt will be independent with HEP    Time 6    Period Weeks    Status New      PT LONG TERM GOAL #2   Title Pt will improve bilat knee ROM by at least 5 deg    Baseline On R: 0 to 90 deg; on L: 7 to 95    Time 6    Period Weeks    Status New      PT LONG TERM GOAL #3   Title Pt will be able to stand and walk for at least 15 minutes for improved home tasks    Time 6    Period Weeks    Status New      PT LONG TERM GOAL #4   Title Pt will have improved TUG score to <13 sec to demo decreased fall risk    Baseline 17 sec    Status New    Target Date 11/14/20                   Plan - 10/31/20 1117     Clinical Impression Statement Tonique arrives to session reporting fatigue but is agreeable to participate. Her Knee AROM has improved bilateral. Continued with LE strength and hamstring stretches. She c/ o intermitent hamstring cramps with bridges, otherwise no c/ o pain during session.    PT Next Visit Plan Assess response to HEP. Continue to work on knee ROM and strengthening.    PT Home Exercise Plan Access Code [email or web address]             Patient will benefit from skilled therapeutic intervention in order to improve the following deficits and impairments:  Abnormal gait, Decreased mobility, Difficulty walking, Hypomobility, Impaired sensation, Decreased range of motion, Improper body mechanics, Decreased activity tolerance, Decreased strength, Increased fascial restricitons,  Impaired flexibility, Pain  Visit Diagnosis: Muscle weakness (generalized)  Difficulty in walking, not elsewhere classified  Other abnormalities of gait and mobility  Chronic pain of left knee  Chronic pain of right knee  Stiffness of left knee, not elsewhere classified  Stiffness of right knee, not elsewhere classified     Problem List Patient Active Problem List   Diagnosis Date Noted   Bereavement  08/18/2020   Spasm of back muscles 11/28/2017   CKD (chronic kidney disease) stage 3, GFR 30-59 ml/min (HCC) 03/29/2016   Presbycusis of both ears 03/12/2016   Calcium pyrophosphate crystal arthritis + osteoarthritis 09/05/2015   Health care maintenance 03/15/2013   Chronic anemia 10/20/2006   Diabetes mellitus with neuropathy ([place]) 02/13/2006   Hyperlipidemia 02/13/2006   Essential hypertension 02/13/2006    Dorene Ar, PTA 10/31/2020, 11:33 AM  [facility] [address] [place], [place], [address] Phone: [phone]   Fax:  [phone]  Name: SHALAH ESTELLE MRN: [id number] Date of Birth: 1928-12-11

## 2020-11-09 ENCOUNTER — Other Ambulatory Visit: Payer: Self-pay

## 2020-11-09 ENCOUNTER — Ambulatory Visit: Payer: Medicare Other

## 2020-11-09 DIAGNOSIS — M25562 Pain in left knee: Secondary | ICD-10-CM | POA: Diagnosis not present

## 2020-11-09 DIAGNOSIS — G8929 Other chronic pain: Secondary | ICD-10-CM | POA: Diagnosis not present

## 2020-11-09 DIAGNOSIS — M25561 Pain in right knee: Secondary | ICD-10-CM | POA: Diagnosis not present

## 2020-11-09 DIAGNOSIS — M25662 Stiffness of left knee, not elsewhere classified: Secondary | ICD-10-CM

## 2020-11-09 DIAGNOSIS — R262 Difficulty in walking, not elsewhere classified: Secondary | ICD-10-CM | POA: Diagnosis not present

## 2020-11-09 DIAGNOSIS — R2689 Other abnormalities of gait and mobility: Secondary | ICD-10-CM | POA: Diagnosis not present

## 2020-11-09 DIAGNOSIS — M25661 Stiffness of right knee, not elsewhere classified: Secondary | ICD-10-CM | POA: Diagnosis not present

## 2020-11-09 DIAGNOSIS — M6281 Muscle weakness (generalized): Secondary | ICD-10-CM | POA: Diagnosis not present

## 2020-11-09 NOTE — Therapy (Signed)
[place], [place], [address] Phone: [phone]   Fax:  [phone]  Physical Therapy Treatment  Patient Details  Name: Wanda Mclaughlin MRN: [id number] Date of Birth: 12/06/1928 Referring Provider (PT): Aldine Contes   Encounter Date: 11/09/2020   PT End of Session - 11/09/20 1016     Visit Number 7    Number of Visits 12    Date for PT Re-Evaluation 11/14/20    Authorization Type UHC - Medicare, FOTO (intake captured on third visit)    Progress Note Due on Visit 10    PT Start Time 1016    PT Stop Time 1059    PT Time Calculation (min) 43 min    Activity Tolerance Patient tolerated treatment well    Behavior During Therapy [facility] for tasks assessed/performed             Past Medical History:  Diagnosis Date   Anemia    Normal EGG, colonoscopy, and Sm. Bowel F/T 2006   Bilateral hand pain 07/09/2016   Chronic knee pain    Chronic renal failure    Degenerative joint disease    Diabetes mellitus 11/05   type II   Diabetic peripheral neuropathy ([place])    Duodenitis    Gastritis    Health maintenance examination    Mammogram 10/11: Possible mass, right breast. No evidence of malignancy in left breast. Needle biopsy of left breast 12/11: No evidence of  carcinoma. Patient needs follow-up right breast diagnostic mammogram with ultrasound in 6 months.   History of diabetic gastroparesis 11/28/2017   Hyperlipidemia    Hypertension    Left knee pain 10/06/2017   Osteoporosis 09/2009   T score -2.8   Psychophysiological insomnia 11/15/2016   Vertigo     Past Surgical History:  Procedure Laterality Date    Right needle localized lumpectomy.   12/11   Path report:FOCAL ATYPICAL DUCTAL HYPERPLASIA IN A BACKGROUND OF STROMAL  FIBROSIS. No evidence of carcinoma.    ABDOMINAL HYSTERECTOMY     1980's (Fibroids)   COLONOSCOPY     8/11 by Dr. Ardis Hughs: Normal colon, Given your age, you will not need  another colonoscopy for colon cancer screening or polyp surveillance    There were no vitals filed for this visit.   Subjective Assessment - 11/09/20 1021     Subjective The patient reports that her left knee is bothering her the worse today.  The pt states that her knees were doing good and then they have been bothering me for the last few days.    Patient is accompained by: Family member    Currently in Pain? Yes    Pain Score 7     Pain Location Knee    Pain Orientation Left                [facility] PT Assessment - 11/09/20 0001       Assessment   Medical Diagnosis M11.80 (ICD-10-CM) - Calcium pyrophosphate crystal arthritis    Referring Provider (PT) Aldine Contes                           [facility] Adult PT Treatment/Exercise - 11/09/20 0001       Knee/Hip Exercises: Stretches   Active Hamstring Stretch 20 seconds    Active Hamstring Stretch Limitations seated EOM x 2 each    Gastroc Stretch Right;Left;20 seconds;3 reps    Press photographer  Limitations Sitting w/ strap      Knee/Hip Exercises: Aerobic   Nustep L5 x 6 min UEs/LEs      Knee/Hip Exercises: Standing   Forward Step Up Right;1 set;15 reps;Hand Hold: 2;Step Height: 4"    Forward Step Up Limitations Left 15 reps 2 inch step, 2 HHA      Knee/Hip Exercises: Seated   Long Arc Quad Strengthening;Both;2 sets;10 reps    Long Arc Quad Weight 0 lbs.    Long Arc Quad Limitations Green theraband    Sit to [employer] 10 reps      Knee/Hip Exercises: Supine   Heel Slides 20 reps;Both    Heel Slides Limitations LE on SB    Bridges 2 sets;10 reps    Bridges Limitations LE on SB    Straight Leg Raises Limitations 3x10 ea    Other Supine Knee/Hip Exercises Clam 3x10 blue TB                      PT Short Term Goals - 10/31/20 1024       PT SHORT TERM GOAL #1   Title Pt will have improved 5x STS to <13 sec to demo decreased fall risk and improved functional strength    Baseline 16 sec  with UE on eval,      20.4 sec without UE 10/31/20    Time 3    Period Weeks    Status On-going    Target Date 10/31/20      PT SHORT TERM GOAL #2   Title PT will have pt perform FOTO and create appropriate goal in the next 1-2 visits    Baseline intake captured on third visit, need to set goal    Time 2    Period Weeks    Status Partially Met    Target Date 10/17/20               PT Long Term Goals - 10/10/20 1059       PT LONG TERM GOAL #1   Title Pt will be independent with HEP    Time 6    Period Weeks    Status New      PT LONG TERM GOAL #2   Title Pt will improve bilat knee ROM by at least 5 deg    Baseline On R: 0 to 90 deg; on L: 7 to 95    Time 6    Period Weeks    Status New      PT LONG TERM GOAL #3   Title Pt will be able to stand and walk for at least 15 minutes for improved home tasks    Time 6    Period Weeks    Status New      PT LONG TERM GOAL #4   Title Pt will have improved TUG score to <13 sec to demo decreased fall risk    Baseline 17 sec    Status New    Target Date 11/14/20                   Plan - 11/09/20 1059     Clinical Impression Statement The patient reports that her left knee is bothering her more then the right knee this morning.  She rated her left knee pain at a 7/10.  The patient arrrived to therapy walking with a SEC.  She was able to progress therapy with bridges on the SB and step ups.  On the right she was able to complete a 4 inch step ups with use of hands and on the left a 2 inch step up.  The patient tolerated therapy.  Recommend continued LE strengthening and step ups for steps into her home.    Personal Factors and Comorbidities Age;Past/Current Experience;Fitness;Transportation    Examination-Activity Limitations Bed Mobility;Stand;Squat;Locomotion Level;Hygiene/Grooming    Examination-Participation Restrictions Cleaning;Laundry;Community Activity;Shop    PT Treatment/Interventions ADLs/Self Care Home  Management;Aquatic Therapy;Cryotherapy;Biofeedback;Electrical Stimulation;Iontophoresis 4mg /ml Dexamethasone;Moist Heat;Ultrasound;DME Instruction;Gait training;Stair training;Functional mobility training;Therapeutic activities;Neuromuscular re-education;Balance training;Therapeutic exercise;Patient/family education;Manual techniques;Passive range of motion;Dry needling;Energy conservation;Taping;Vasopneumatic Device;Joint Manipulations;Spinal Manipulations    PT Next Visit Plan CKC strengthening progression. Continue to work on knee ROM and strengthening.    PT Home Exercise Plan Access Code [email or web address]    Consulted and Agree with Plan of Care Patient             Patient will benefit from skilled therapeutic intervention in order to improve the following deficits and impairments:  Abnormal gait, Decreased mobility, Difficulty walking, Hypomobility, Impaired sensation, Decreased range of motion, Improper body mechanics, Decreased activity tolerance, Decreased strength, Increased fascial restricitons, Impaired flexibility, Pain  Visit Diagnosis: Difficulty in walking, not elsewhere classified  Chronic pain of right knee  Chronic pain of left knee  Stiffness of left knee, not elsewhere classified  Stiffness of right knee, not elsewhere classified     Problem List Patient Active Problem List   Diagnosis Date Noted   Bereavement 08/18/2020   Spasm of back muscles 11/28/2017   CKD (chronic kidney disease) stage 3, GFR 30-59 ml/min (HCC) 03/29/2016   Presbycusis of both ears 03/12/2016   Calcium pyrophosphate crystal arthritis + osteoarthritis 09/05/2015   Health care maintenance 03/15/2013   Chronic anemia 10/20/2006   Diabetes mellitus with neuropathy ([place]) 02/13/2006   Hyperlipidemia 02/13/2006   Essential hypertension 02/13/2006   Rich Number, PT, DPT, OCS, Crt. DN  Bethena Midget 11/09/2020, 11:02 AM  [facility] [address] [place], [place], [address] Phone: [phone]   Fax:  [phone]  Name: Wanda Mclaughlin MRN: [id number] Date of Birth: Sep 10, 1928

## 2020-11-16 ENCOUNTER — Ambulatory Visit: Payer: Medicare Other

## 2020-11-16 ENCOUNTER — Other Ambulatory Visit: Payer: Self-pay

## 2020-11-16 DIAGNOSIS — R2689 Other abnormalities of gait and mobility: Secondary | ICD-10-CM | POA: Diagnosis not present

## 2020-11-16 DIAGNOSIS — M25662 Stiffness of left knee, not elsewhere classified: Secondary | ICD-10-CM | POA: Diagnosis not present

## 2020-11-16 DIAGNOSIS — M25661 Stiffness of right knee, not elsewhere classified: Secondary | ICD-10-CM | POA: Diagnosis not present

## 2020-11-16 DIAGNOSIS — R262 Difficulty in walking, not elsewhere classified: Secondary | ICD-10-CM

## 2020-11-16 DIAGNOSIS — M6281 Muscle weakness (generalized): Secondary | ICD-10-CM | POA: Diagnosis not present

## 2020-11-16 DIAGNOSIS — M25562 Pain in left knee: Secondary | ICD-10-CM | POA: Diagnosis not present

## 2020-11-16 DIAGNOSIS — G8929 Other chronic pain: Secondary | ICD-10-CM | POA: Diagnosis not present

## 2020-11-16 DIAGNOSIS — M25561 Pain in right knee: Secondary | ICD-10-CM

## 2020-11-16 NOTE — Therapy (Signed)
[place], [place], [address] Phone: [phone]   Fax:  [phone]  Physical Therapy Treatment  Patient Details  Name: Wanda Mclaughlin MRN: [id number] Date of Birth: 1928-10-11 Referring Provider (PT): Aldine Contes   Encounter Date: 11/16/2020   PT End of Session - 11/16/20 1022     Visit Number 8    Number of Visits 12    Date for PT Re-Evaluation 11/14/20    Authorization Type UHC - Medicare, FOTO (intake captured on third visit)    Progress Note Due on Visit 10    PT Start Time 1018    PT Stop Time 1058    PT Time Calculation (min) 40 min    Activity Tolerance Patient tolerated treatment well    Behavior During Therapy [facility] for tasks assessed/performed             Past Medical History:  Diagnosis Date   Anemia    Normal EGG, colonoscopy, and Sm. Bowel F/T 2006   Bilateral hand pain 07/09/2016   Chronic knee pain    Chronic renal failure    Degenerative joint disease    Diabetes mellitus 11/05   type II   Diabetic peripheral neuropathy ([place])    Duodenitis    Gastritis    Health maintenance examination    Mammogram 10/11: Possible mass, right breast. No evidence of malignancy in left breast. Needle biopsy of left breast 12/11: No evidence of  carcinoma. Patient needs follow-up right breast diagnostic mammogram with ultrasound in 6 months.   History of diabetic gastroparesis 11/28/2017   Hyperlipidemia    Hypertension    Left knee pain 10/06/2017   Osteoporosis 09/2009   T score -2.8   Psychophysiological insomnia 11/15/2016   Vertigo     Past Surgical History:  Procedure Laterality Date    Right needle localized lumpectomy.   12/11   Path report:FOCAL ATYPICAL DUCTAL HYPERPLASIA IN A BACKGROUND OF STROMAL  FIBROSIS. No evidence of carcinoma.    ABDOMINAL HYSTERECTOMY     1980's (Fibroids)   COLONOSCOPY     8/11 by Dr. Ardis Hughs: Normal colon, Given your age, you will not need  another colonoscopy for colon cancer screening or polyp surveillance    There were no vitals filed for this visit.   Subjective Assessment - 11/16/20 1022     Subjective The patient reports that her knees are feeling pretty good today.  She reports that her knees hurt worse when it rains and the weather is bad.    Currently in Pain? Yes    Pain Score 7     Pain Location Knee    Pain Orientation Left;Right                [facility] PT Assessment - 11/16/20 0001       Assessment   Medical Diagnosis M11.80 (ICD-10-CM) - Calcium pyrophosphate crystal arthritis    Referring Provider (PT) Aldine Contes                           [facility] Adult PT Treatment/Exercise - 11/16/20 0001       Knee/Hip Exercises: Stretches   Passive Hamstring Stretch 20 seconds;2 reps;Left;Right    Gastroc Stretch Right;Left;20 seconds;3 reps    Press photographer Limitations Sitting w/ strap      Knee/Hip Exercises: Aerobic   Nustep L5 x 6 min UEs/LEs      Knee/Hip Exercises: Standing  Forward Step Up Right;1 set;15 reps;Hand Hold: 2;Step Height: 4";Left      Knee/Hip Exercises: Seated   Long Arc Quad Strengthening;Both;2 sets;10 reps    Long Arc Quad Weight 0 lbs.    Long Arc Quad Limitations Green theraband    Sit to [employer] 10 reps;2 sets      Knee/Hip Exercises: Supine   Heel Slides 20 reps;Both    Heel Slides Limitations LE on SB    Bridges 2 sets;10 reps    Bridges Limitations LE on SB    Straight Leg Raises Limitations 3x10 ea    Other Supine Knee/Hip Exercises Clam 3x10 blue TB                      PT Short Term Goals - 10/31/20 1024       PT SHORT TERM GOAL #1   Title Pt will have improved 5x STS to <13 sec to demo decreased fall risk and improved functional strength    Baseline 16 sec with UE on eval,      20.4 sec without UE 10/31/20    Time 3    Period Weeks    Status On-going    Target Date 10/31/20      PT SHORT TERM GOAL #2   Title PT will have  pt perform FOTO and create appropriate goal in the next 1-2 visits    Baseline intake captured on third visit, need to set goal    Time 2    Period Weeks    Status Partially Met    Target Date 10/17/20               PT Long Term Goals - 10/10/20 1059       PT LONG TERM GOAL #1   Title Pt will be independent with HEP    Time 6    Period Weeks    Status New      PT LONG TERM GOAL #2   Title Pt will improve bilat knee ROM by at least 5 deg    Baseline On R: 0 to 90 deg; on L: 7 to 95    Time 6    Period Weeks    Status New      PT LONG TERM GOAL #3   Title Pt will be able to stand and walk for at least 15 minutes for improved home tasks    Time 6    Period Weeks    Status New      PT LONG TERM GOAL #4   Title Pt will have improved TUG score to <13 sec to demo decreased fall risk    Baseline 17 sec    Status New    Target Date 11/14/20                   Plan - 11/16/20 1103     Clinical Impression Statement The patient did have difficulty hearing and following directions today in therapy.  She continues to report 7/10 pain in the knee.  Continued with the 4 inch step ups.  The patient was able to perform with the left lower extremity, but with compensational movements.  The patient continues to use a SEC for ambulation.  Recommend re-assessment next session for possible DC to HEP.    Personal Factors and Comorbidities Age;Past/Current Experience;Fitness;Transportation    Examination-Activity Limitations Bed Mobility;Stand;Squat;Locomotion Level;Hygiene/Grooming    Examination-Participation Restrictions Cleaning;Laundry;Community Activity;Shop    PT Treatment/Interventions ADLs/Self  Care Home Management;Aquatic Therapy;Cryotherapy;Biofeedback;Electrical Stimulation;Iontophoresis 77m/ml Dexamethasone;Moist Heat;Ultrasound;DME Instruction;Gait training;Stair training;Functional mobility training;Therapeutic activities;Neuromuscular re-education;Balance  training;Therapeutic exercise;Patient/family education;Manual techniques;Passive range of motion;Dry needling;Energy conservation;Taping;Vasopneumatic Device;Joint Manipulations;Spinal Manipulations    PT Next Visit Plan RE-assess for possible DC to HEP    PT Home Exercise Plan Access Code [email or web address]    Consulted and Agree with Plan of Care Patient             Patient will benefit from skilled therapeutic intervention in order to improve the following deficits and impairments:  Abnormal gait, Decreased mobility, Difficulty walking, Hypomobility, Impaired sensation, Decreased range of motion, Improper body mechanics, Decreased activity tolerance, Decreased strength, Increased fascial restricitons, Impaired flexibility, Pain  Visit Diagnosis: Difficulty in walking, not elsewhere classified  Chronic pain of right knee  Chronic pain of left knee     Problem List Patient Active Problem List   Diagnosis Date Noted   Bereavement 08/18/2020   Spasm of back muscles 11/28/2017   CKD (chronic kidney disease) stage 3, GFR 30-59 ml/min ([place] 03/29/2016   Presbycusis of both ears 03/12/2016   Calcium pyrophosphate crystal arthritis + osteoarthritis 09/05/2015   Health care maintenance 03/15/2013   Chronic anemia 10/20/2006   Diabetes mellitus with neuropathy ([place] 02/13/2006   Hyperlipidemia 02/13/2006   Essential hypertension 02/13/2006   JRich Number PT, DPT, OCS, Crt. DN  JBethena Midget7/21/2022, 11:05 AM  [facility]GF[place] [address]Phone: [phone]  Fax:  [phone] Name: Wanda BOWELLMRN: [id number] of Birth: 106-Dec-1930

## 2020-11-24 ENCOUNTER — Other Ambulatory Visit: Payer: Self-pay

## 2020-11-24 ENCOUNTER — Ambulatory Visit: Payer: Medicare Other

## 2020-11-24 DIAGNOSIS — G8929 Other chronic pain: Secondary | ICD-10-CM | POA: Diagnosis not present

## 2020-11-24 DIAGNOSIS — M25662 Stiffness of left knee, not elsewhere classified: Secondary | ICD-10-CM | POA: Diagnosis not present

## 2020-11-24 DIAGNOSIS — M25561 Pain in right knee: Secondary | ICD-10-CM | POA: Diagnosis not present

## 2020-11-24 DIAGNOSIS — R2689 Other abnormalities of gait and mobility: Secondary | ICD-10-CM | POA: Diagnosis not present

## 2020-11-24 DIAGNOSIS — M25661 Stiffness of right knee, not elsewhere classified: Secondary | ICD-10-CM | POA: Diagnosis not present

## 2020-11-24 DIAGNOSIS — R262 Difficulty in walking, not elsewhere classified: Secondary | ICD-10-CM | POA: Diagnosis not present

## 2020-11-24 DIAGNOSIS — M6281 Muscle weakness (generalized): Secondary | ICD-10-CM | POA: Diagnosis not present

## 2020-11-24 DIAGNOSIS — M25562 Pain in left knee: Secondary | ICD-10-CM | POA: Diagnosis not present

## 2020-11-24 NOTE — Therapy (Signed)
[place], [place], [address] Phone: [phone]   Fax:  [phone]  Physical Therapy Treatment / Discharge  Patient Details  Name: Wanda Mclaughlin MRN: [id number] Date of Birth: December 06, 1928 Referring Provider (PT): Aldine Contes  PHYSICAL THERAPY DISCHARGE SUMMARY  Visits from Start of Care: 10/10/09  Current functional level related to goals / functional outcomes: Walking 15 minutes with SEC, Lower extremity strength grossly 4+/5,  TUG 15 seconds   Remaining deficits: Walking/standing limited to 10-15 minutes   Education / Equipment: HEP/ SEC for ambulation   Patient agrees to discharge. Patient goals were partially met. Patient is being discharged due to being pleased with the current functional level.   Encounter Date: 11/24/2020   PT End of Session - 11/24/20 1106     Visit Number 9    Number of Visits [place] (intake captured on third visit)    PT Start Time 1106    PT Stop Time 1147    PT Time Calculation (min) 41 min    Activity Tolerance Patient tolerated treatment well    Behavior During Therapy WFL for tasks assessed/performed             Past Medical History:  Diagnosis Date   Anemia    Normal EGG, colonoscopy, and Sm. Bowel F/T 2006   Bilateral hand pain 07/09/2016   Chronic knee pain    Chronic renal failure    Degenerative joint disease    Diabetes mellitus 11/05   type II   Diabetic peripheral neuropathy ([place])    Duodenitis    Gastritis    Health maintenance examination    Mammogram 10/11: Possible mass, right breast. No evidence of malignancy in left breast. Needle biopsy of left breast 12/11: No evidence of  carcinoma. Patient needs follow-up right breast diagnostic mammogram with ultrasound in 6 months.   History of diabetic gastroparesis 11/28/2017   Hyperlipidemia    Hypertension    Left knee pain 10/06/2017   Osteoporosis  09/2009   T score -2.8   Psychophysiological insomnia 11/15/2016   Vertigo     Past Surgical History:  Procedure Laterality Date    Right needle localized lumpectomy.   12/11   Path report:FOCAL ATYPICAL DUCTAL HYPERPLASIA IN A BACKGROUND OF STROMAL  FIBROSIS. No evidence of carcinoma.    ABDOMINAL HYSTERECTOMY     1980's (Fibroids)   COLONOSCOPY     8/11 by Dr. Ardis Hughs: Normal colon, Given your age, you will not need another colonoscopy for colon cancer screening or polyp surveillance    There were no vitals filed for this visit.   Subjective Assessment - 11/24/20 1110     Subjective The patient reports she feels like she has gotten stronger.    Limitations Walking;Standing;House hold activities    How long can you stand comfortably? 10 minutes    How long can you walk comfortably? 30 minutes    Currently in Pain? Yes    Pain Score 7     Pain Location Knee    Pain Orientation Right;Left                [facility] PT Assessment - 11/24/20 0001       Assessment   Medical Diagnosis M11.80 (ICD-10-CM) - Calcium pyrophosphate crystal arthritis    Referring Provider (PT) Narendra, Nischal      Observation/Other Assessments   Focus on Therapeutic Outcomes (FOTO)  37;  predicted 53      AROM   Right Knee Extension 0    Right Knee Flexion 105    Left Knee Extension -7    Left Knee Flexion 100      Strength   Right Hip Flexion 4+/5    Right Hip Extension 4/5    Right Hip External Rotation  4+/5    Right Hip Internal Rotation 4+/5    Right Hip ABduction 3/5    Left Hip Flexion 5/5    Left Hip Extension 4/5    Left Hip External Rotation 4+/5    Left Hip Internal Rotation 4+/5    Left Hip ABduction 4+/5    Right Knee Flexion 5/5    Right Knee Extension 5/5    Left Knee Flexion 5/5    Left Knee Extension 4+/5      Ambulation/Gait   Ambulation/Gait Yes    Ambulation/Gait Assistance 6: Modified independent (Device/Increase time)    Ambulation Distance (Feet) 100 Feet     Assistive device Straight cane    Gait Pattern Step-through pattern;Left flexed knee in stance;Trunk flexed      Standardized Balance Assessment   Standardized Balance Assessment Timed Up and Go Test;Five Times Sit to Stand    Five times sit to stand comments  20   without UE     Timed Up and Go Test   Normal TUG (seconds) 14                           [facility] Adult PT Treatment/Exercise - 11/24/20 0001       Knee/Hip Exercises: Stretches   Passive Hamstring Stretch 20 seconds;2 reps;Left;Right      Knee/Hip Exercises: Aerobic   Nustep L5 x 6 min UEs/LEs      Knee/Hip Exercises: Seated   Long Arc Quad Strengthening;Both;2 sets;10 reps    Long Arc Quad Weight 0 lbs.    Long Arc Quad Limitations Green theraband    Sit to [employer] 10 reps      Knee/Hip Exercises: Supine   Bridges Strengthening;10 reps    Straight Leg Raises Limitations x 15 each                    PT Education - 11/24/20 1151     Education Details continuation of HEP    Person(s) Educated Patient    Methods Explanation;Handout    Comprehension Verbalized understanding              PT Short Term Goals - 11/24/20 1132       PT SHORT TERM GOAL #1   Title Pt will have improved 5x STS to <13 sec to demo decreased fall risk and improved functional strength    Baseline 16 sec with UE on eval,      20.4 sec without UE 10/31/20    Time 3    Period Weeks    Status Unable to assess   Time increased due to difficulty following directions   Target Date 10/31/20      PT SHORT TERM GOAL #2   Title PT will have incresae FOTO to 52%    Baseline 38% ;  11/24/20: 42%    Time 2    Period Weeks    Status Partially Met    Target Date 10/17/20               PT Long Term Goals - 11/24/20 1132  PT LONG TERM GOAL #1   Title Pt will be independent with HEP    Time 6    Period Weeks    Status Achieved      PT LONG TERM GOAL #2   Title Pt will improve bilat knee ROM by at least 5  deg    Baseline On R: 0 to 90 deg; on L: 7 to 95  7/29:  Right 0 to 105 , Left -7 to 100 deg    Time 6    Period Weeks    Status Achieved      PT LONG TERM GOAL #3   Title Pt will be able to stand and walk for at least 15 minutes for improved home tasks    Time 6    Period Weeks    Status Achieved      PT LONG TERM GOAL #4   Title Pt will have improved TUG score to <13 sec to demo decreased fall risk    Baseline 17 sec  7/29: 15 seconds    Status Partially Met                   Plan - 11/24/20 1116     Clinical Impression Statement Wanda Mclaughlin has attended 9 sessions of physical therapy.  She presents with improvement of lower extremity strength and bilateral knee flexion active range of motion.  The patient did have improvement of her TUG time, but a reduction of the 5 sit to stand test time.  The patient did have difficulty with following instructions and answering questions.  She did need repeat instructions and had some difficulty with hearing.  The patient ambulated safely with a SEC today in therapy.  She has partially met her goals.  Recommend discharge to her home exercise program.    Personal Factors and Comorbidities Age;Transportation    Examination-Activity Limitations Locomotion Level;Transfers;Squat;Stand;Lift    Examination-Participation Restrictions Cleaning;Meal Prep;Laundry;Community Activity;Shop    PT Next Visit Plan [place] to HEP,  goals partially met    PT Home Exercise Plan Access Code [email or web address]    Consulted and Agree with Plan of Care Patient             Patient will benefit from skilled therapeutic intervention in order to improve the following deficits and impairments:  Abnormal gait, Decreased range of motion, Difficulty walking, Decreased activity tolerance, Decreased mobility, Postural dysfunction  Visit Diagnosis: Difficulty in walking, not elsewhere classified  Chronic pain of right knee  Chronic pain of left knee     Problem  List Patient Active Problem List   Diagnosis Date Noted   Bereavement 08/18/2020   Spasm of back muscles 11/28/2017   CKD (chronic kidney disease) stage 3, GFR 30-59 ml/min (HCC) 03/29/2016   Presbycusis of both ears 03/12/2016   Calcium pyrophosphate crystal arthritis + osteoarthritis 09/05/2015   Health care maintenance 03/15/2013   Chronic anemia 10/20/2006   Diabetes mellitus with neuropathy ([place]) 02/13/2006   Hyperlipidemia 02/13/2006   Essential hypertension 02/13/2006   Rich Number, PT, DPT, OCS, Crt. DN  Bethena Midget 11/24/2020, 11:56 AM  [facility] [address] [place], [place], [address] Phone: [phone]   Fax:  [phone]  Name: Wanda Mclaughlin MRN: [id number] Date of Birth: [birth year]

## 2020-11-24 NOTE — Patient Instructions (Signed)
Access Code: [email or web address] URL: [email or web address] Date: 11/24/2020 Prepared by: Sharol Roussel  Exercises Supine Active Straight Leg Raise - 1 x daily - 7 x weekly - 2 sets - 10 reps Supine Bridge - 1 x daily - 7 x weekly - 2 sets - 10 reps Seated Hip Abduction with Resistance - 1 x daily - 7 x weekly - 2 sets - 10 reps Seated Hamstring Curls with Resistance - 1 x daily - 7 x weekly - 2 sets - 10 reps Seated Heel Slide - 1 x daily - 7 x weekly - 3 sets - 10 reps Sit to Stand - 1 x daily - 7 x weekly - 3 sets - 5 reps

## 2020-11-30 ENCOUNTER — Other Ambulatory Visit: Payer: Self-pay | Admitting: Student

## 2020-11-30 ENCOUNTER — Other Ambulatory Visit: Payer: Self-pay | Admitting: Internal Medicine

## 2020-11-30 DIAGNOSIS — I10 Essential (primary) hypertension: Secondary | ICD-10-CM

## 2020-12-07 ENCOUNTER — Other Ambulatory Visit: Payer: Self-pay

## 2020-12-07 ENCOUNTER — Ambulatory Visit (INDEPENDENT_AMBULATORY_CARE_PROVIDER_SITE_OTHER): Payer: Medicare Other | Admitting: Internal Medicine

## 2020-12-07 ENCOUNTER — Encounter: Payer: Self-pay | Admitting: Internal Medicine

## 2020-12-07 VITALS — BP 135/74 | HR 96 | Temp 98.8°F | Ht 63.0 in | Wt 168.8 lb

## 2020-12-07 DIAGNOSIS — M1180 Other specified crystal arthropathies, unspecified site: Secondary | ICD-10-CM | POA: Diagnosis not present

## 2020-12-07 DIAGNOSIS — I10 Essential (primary) hypertension: Secondary | ICD-10-CM

## 2020-12-07 DIAGNOSIS — M62830 Muscle spasm of back: Secondary | ICD-10-CM

## 2020-12-07 DIAGNOSIS — E1140 Type 2 diabetes mellitus with diabetic neuropathy, unspecified: Secondary | ICD-10-CM

## 2020-12-07 MED ORDER — CYCLOBENZAPRINE HCL 10 MG PO TABS: 10.0000 mg | ORAL_TABLET | ORAL | 3 refills | Status: DC

## 2020-12-07 NOTE — Assessment & Plan Note (Addendum)
Vitals:   12/07/20 1056 12/07/20 1134  BP: (!) 147/66 135/74   Medications: Norvasc 10 mg, Hyzaar 100-25 mg Patient states she takes her medications daily. Does not have any symptoms of dizziness or light-headedness.  She no longer takes her blood pressure at home.   Plan: -continue Norvasc 10 mg, Hyzaar 100-25 mg

## 2020-12-07 NOTE — Patient Instructions (Addendum)
[name], it was a pleasure seeing you today!  Today we discussed: Diabetes- Please continue taking your Metformin.  We will repeat blood work at your next visit in October.   High blood pressure- Your blood pressure was 135/74.  Continue taking your medications as you have been. Knee injection- Please follow-up on Monday at 11 for an injection for your left knee.  Once you get to the hospital tell them that you need a volunteer to help get you to the clinic.  I have ordered the following labs today:  Lab Orders  No laboratory test(s) ordered today     Tests ordered today:  none  Referrals ordered today:   Referral Orders  No referral(s) requested today     I have ordered the following medication/changed the following medications:   Stop the following medications: There are no discontinued medications.   Start the following medications: No orders of the defined types were placed in this encounter.    Follow-up: 3 months   Please make sure to arrive 15 minutes prior to your next appointment. If you arrive late, you may be asked to reschedule.   We look forward to seeing you next time. Please call our clinic at [phone] if you have any questions or concerns. The best time to call is Monday-Friday from 9am-4pm, but there is someone available 24/7. If after hours or the weekend, call the main hospital number and ask for the Internal Medicine Resident On-Call. If you need medication refills, please notify your pharmacy one week in advance and they will send [place] a request.  Thank you for letting us take part in your care. Wishing you the best!  Thank you, Dr. [facility]

## 2020-12-07 NOTE — Assessment & Plan Note (Addendum)
Medications:   Glucose-Capillary  Date/Time Value Ref Range Status  08/17/2020 11:01 AM 126 (H) 70 - 99 mg/dL Final    Comment:    Glucose reference range applies only to samples taken after fasting for at least 8 hours.    Last A1c was  Lab Results  Component Value Date   HGBA1C 6.3 (A) 08/17/2020  .   A/P: Dm has been well managed on Metformin. Patient is not currently monitoring daily blood sugars due to be stable with A1c over the past years. She has been tolerating medications well as long as she eats before taking it. 1. Medications: Metformin 500 mg BID 2. Goals:  A. adherence to prescribed medication regimen B. CBG - 100-120  C. Hgb A1c: <7 3. Follow-up: Will follow-up in October for repeat A1c.  Patient states that she follow with an eye doctor and will get in contact with them for her yearly screening

## 2020-12-07 NOTE — Progress Notes (Signed)
   CC: follow-up for diabetes and chronic knee pain  HPI:  [name] is a 87 y.o. with medical history as below presenting to [facility] for follow-up for diabetes and chronic knee pain.  Please see problem-based list for further details, assessments, and plans.  Past Medical History:  Diagnosis Date   Anemia    Normal EGG, colonoscopy, and Sm. Bowel F/T 2006   Bilateral hand pain 07/09/2016   Chronic knee pain    Chronic renal failure    Degenerative joint disease    Diabetes mellitus 11/05   type II   Diabetic peripheral neuropathy (HCC)    Duodenitis    Gastritis    Health maintenance examination    Mammogram 10/11: Possible mass, right breast. No evidence of malignancy in left breast. Needle biopsy of left breast 12/11: No evidence of  carcinoma. Patient needs follow-up right breast diagnostic mammogram with ultrasound in 6 months.   History of diabetic gastroparesis 11/28/2017   Hyperlipidemia    Hypertension    Left knee pain 10/06/2017   Osteoporosis 09/2009   T score -2.8   Psychophysiological insomnia 11/15/2016   Vertigo     Review of Systems:   Constitutional: denies weight change, fatigue, and weakness Gastrointestinal: denies nausea, vomiting, diarrhea Psychiatric/Behavioral: denies depression and anxiety  Physical Exam:  Vitals:   12/07/20 1056 12/07/20 1134  BP: (!) 147/66 135/74  Pulse: (!) 102 96  Temp: 98.8 F (37.1 C)   TempSrc: Oral   SpO2: 98% 97%  Weight: 168 lb 12.8 oz (76.6 kg)   Height: 5\' 3"  (1.6 m)     General: well-developed, well-nourished, sitting comfortably in wheelchair HENT: NCAT, hearing aids in place Eyes: wearing bifocals, conjunctiva clear CV: No murmurs, normal rate Pulm: CTAB, normal pulmonary effort GI: no tenderness, bowel sounds present MSK: ambulates with cane at home, no muscle atrophy, crepitus on flexion of knees bilaterally, no obvious effusion, erythema, or warmth Skin: dry and warm, normal turgor Psych: normal  mood and affect  Assessment & Plan:   See Encounters Tab for problem based charting.  Patient seen with Dr. 

## 2020-12-07 NOTE — Assessment & Plan Note (Signed)
Patient presents with worsening pain in her left knee.  She has been diagnosed with pseudogout and OA.  Uses voltaren gel with some relief and has also tried PT earlier this year.  She states that she did not find PT helpful.  Previously received steroid injections to knee and these relieved pain for a couple week.  She is interested in another steroid injection to her left knee. Plan: -follow-up with patient for left knee injection 12/11/20

## 2020-12-11 ENCOUNTER — Ambulatory Visit (INDEPENDENT_AMBULATORY_CARE_PROVIDER_SITE_OTHER): Payer: Medicare Other | Admitting: Internal Medicine

## 2020-12-11 ENCOUNTER — Encounter: Payer: Self-pay | Admitting: Internal Medicine

## 2020-12-11 ENCOUNTER — Other Ambulatory Visit: Payer: Self-pay

## 2020-12-11 VITALS — BP 158/74 | HR 78 | Temp 98.4°F | Ht 63.0 in | Wt 170.3 lb

## 2020-12-11 DIAGNOSIS — M1180 Other specified crystal arthropathies, unspecified site: Secondary | ICD-10-CM | POA: Diagnosis not present

## 2020-12-11 NOTE — Progress Notes (Signed)
   CC: follow-up for bilateral interarticular steroid injection to knees   HPI:  Wanda Mclaughlin is a 87 y.o. with medical history of Anemia, Chronic Renal Failure, Diabetes Mellitus with neuropathy, HLD, HTN, pseudogout and osteoarthritis of knees bilaterally presenting to [facility] for follow-up for bilateral interarticular steroid injection to knees.   Please see problem-based list for further details, assessments, and plans.  Past Medical History:  Diagnosis Date   Anemia    Normal EGG, colonoscopy, and Sm. Bowel F/T 2006   Bilateral hand pain 07/09/2016   Chronic knee pain    Chronic renal failure    Degenerative joint disease    Diabetes mellitus 11/05   type II   Diabetic peripheral neuropathy (HCC)    Duodenitis    Gastritis    Health maintenance examination    Mammogram 10/11: Possible mass, right breast. No evidence of malignancy in left breast. Needle biopsy of left breast 12/11: No evidence of  carcinoma. Patient needs follow-up right breast diagnostic mammogram with ultrasound in 6 months.   History of diabetic gastroparesis 11/28/2017   Hyperlipidemia    Hypertension    Left knee pain 10/06/2017   Osteoporosis 09/2009   T score -2.8   Psychophysiological insomnia 11/15/2016   Vertigo     Review of Systems:   MSK: endorses continued chronic bilateral knee pain, restricting ambulation.    Physical Exam:  Vitals:   12/11/20 1028 12/11/20 1036  BP: (!) 160/90 (!) 158/74  Pulse: 81 78  Temp: 98.4 F (36.9 C)   TempSrc: Oral   SpO2: 99%   Weight: 170 lb 4.8 oz (77.2 kg)   Height: 5\' 3"  (1.6 m)     General: well-developed, well-nourished, sitting comfortably in wheelchair HENT: NCAT, with hearing aids in place Eyes: no scleral icterus, conjunctiva clear CV: No m,r,g, normal rate Pulm: CTAB, pulmonary effort normal sounds present, no tenderness MH:[id number] on flexion of bilateral knees, no obvious effusion, erythema, or warmth, ROM intact Skin: warm and  dry, normal skin turgor Psych: normal mood and affect  Assessment & Plan:   See Encounters Tab for problem based charting.  Patient seen with Dr. LNL:[id number]

## 2020-12-11 NOTE — Assessment & Plan Note (Addendum)
Ms. Barretto presents with history of Calcium pyrophosphate crystal arthritis and OA of knees bilaterally.  She has tried Voltaren gel, NSAIDs, and physical therapy without relief.  Previously received steroid injection and had relief for about 2 weeks.  Today, there is no obvious effusion, erythema, or warmth. Plan: Steroid injection for knees bilaterally  Procedure Note Indication:     Operators: Dr. Sloan Leiter and Dr. Antony Contras   The patient was provided with risks, benefits, and alternatives to intraarticular injection. She consented to intraarticular knee injection for bilateral pseudogout and OA.  After a time out was preformed, the knees were prepped in a sterile fashion. Cold spray was applied to the skin over the insertion sites (lateral inferior patellar aspect of the knee while in the seated position). A mixture of 2 cc 1% lidocaine and 40 mg of Kenalog were injected into the knees using a 25 gauge 1 and 1/2 inch needle.  The bilateral knee spaces were entered without difficulty after first attempt. The patient tolerated the procedures well without complication.

## 2020-12-11 NOTE — Patient Instructions (Addendum)
[name], it was a pleasure seeing you today!  Today we discussed: Injection of both knees- We discussed risks and benefits of procedure, which include infection and bleeding.  If you notice any redness, swelling, or more pain than normal please give the clinic a call.  Follow-up in 3 months for diabetes blood work  I have ordered the following labs today:  Lab Orders  No laboratory test(s) ordered today     Tests ordered today:  None  Referrals ordered today:   Referral Orders  No referral(s) requested today     I have ordered the following medication/changed the following medications:   Stop the following medications: There are no discontinued medications.   Start the following medications: No orders of the defined types were placed in this encounter.    Follow-up: 3 months   Please make sure to arrive 15 minutes prior to your next appointment. If you arrive late, you may be asked to reschedule.   We look forward to seeing you next time. Please call our clinic at [phone] if you have any questions or concerns. The best time to call is Monday-Friday from 9am-4pm, but there is someone available 24/7. If after hours or the weekend, call the main hospital number and ask for the Internal Medicine Resident On-Call. If you need medication refills, please notify your pharmacy one week in advance and they will send [place] a request.  Thank you for letting us take part in your care. Wishing you the best!  Thank you, Dr. [facility]

## 2020-12-13 NOTE — Progress Notes (Signed)
Internal Medicine Clinic Attending  I saw and evaluated the patient.  I personally confirmed the key portions of the history and exam documented by Dr. Masters and I reviewed pertinent patient test results.  The assessment, diagnosis, and plan were formulated together and I agree with the documentation in the resident's note.  

## 2020-12-25 ENCOUNTER — Other Ambulatory Visit: Payer: Self-pay | Admitting: *Deleted

## 2020-12-25 DIAGNOSIS — I10 Essential (primary) hypertension: Secondary | ICD-10-CM

## 2020-12-25 MED ORDER — AMLODIPINE BESYLATE 10 MG PO TABS: 10.0000 mg | ORAL_TABLET | Freq: Every day | ORAL | 1 refills | Status: DC

## 2020-12-28 NOTE — Progress Notes (Signed)
Internal Medicine Clinic Attending  I saw and evaluated the patient.  I personally confirmed the key portions of the history and exam documented by Dr. Masters and I reviewed pertinent patient test results.  The assessment, diagnosis, and plan were formulated together and I agree with the documentation in the resident's note.  

## 2021-02-03 ENCOUNTER — Other Ambulatory Visit: Payer: Self-pay | Admitting: Internal Medicine

## 2021-02-05 ENCOUNTER — Other Ambulatory Visit: Payer: Self-pay | Admitting: Internal Medicine

## 2021-02-05 DIAGNOSIS — E1140 Type 2 diabetes mellitus with diabetic neuropathy, unspecified: Secondary | ICD-10-CM

## 2021-02-05 DIAGNOSIS — D649 Anemia, unspecified: Secondary | ICD-10-CM

## 2021-02-05 DIAGNOSIS — Z0000 Encounter for general adult medical examination without abnormal findings: Secondary | ICD-10-CM

## 2021-02-07 ENCOUNTER — Ambulatory Visit (INDEPENDENT_AMBULATORY_CARE_PROVIDER_SITE_OTHER): Payer: Medicare Other | Admitting: Internal Medicine

## 2021-02-07 ENCOUNTER — Encounter: Payer: Self-pay | Admitting: Internal Medicine

## 2021-02-07 VITALS — BP 130/64 | HR 81 | Temp 98.9°F | Wt 168.8 lb

## 2021-02-07 DIAGNOSIS — N1832 Chronic kidney disease, stage 3b: Secondary | ICD-10-CM | POA: Diagnosis not present

## 2021-02-07 DIAGNOSIS — R200 Anesthesia of skin: Secondary | ICD-10-CM | POA: Diagnosis not present

## 2021-02-07 DIAGNOSIS — E1140 Type 2 diabetes mellitus with diabetic neuropathy, unspecified: Secondary | ICD-10-CM

## 2021-02-07 DIAGNOSIS — R202 Paresthesia of skin: Secondary | ICD-10-CM | POA: Diagnosis not present

## 2021-02-07 DIAGNOSIS — M1180 Other specified crystal arthropathies, unspecified site: Secondary | ICD-10-CM

## 2021-02-07 DIAGNOSIS — D649 Anemia, unspecified: Secondary | ICD-10-CM

## 2021-02-07 DIAGNOSIS — I10 Essential (primary) hypertension: Secondary | ICD-10-CM | POA: Diagnosis not present

## 2021-02-07 DIAGNOSIS — Z0000 Encounter for general adult medical examination without abnormal findings: Secondary | ICD-10-CM | POA: Diagnosis not present

## 2021-02-07 DIAGNOSIS — M62830 Muscle spasm of back: Secondary | ICD-10-CM

## 2021-02-07 DIAGNOSIS — Z23 Encounter for immunization: Secondary | ICD-10-CM

## 2021-02-07 LAB — POCT GLYCOSYLATED HEMOGLOBIN (HGB A1C): Hemoglobin A1C: 6.1 % — AB (ref 4.0–5.6)

## 2021-02-07 LAB — GLUCOSE, CAPILLARY: Glucose-Capillary: 126 mg/dL — ABNORMAL HIGH (ref 70–99)

## 2021-02-07 MED ORDER — CYCLOBENZAPRINE HCL 10 MG PO TABS: 10.0000 mg | ORAL_TABLET | Freq: Every day | ORAL | 3 refills | Status: DC

## 2021-02-07 MED ORDER — GABAPENTIN 400 MG PO CAPS: 400.0000 mg | ORAL_CAPSULE | Freq: Two times a day (BID) | ORAL | 1 refills | Status: DC

## 2021-02-07 MED ORDER — LOSARTAN POTASSIUM-HCTZ 100-25 MG PO TABS: 1.0000 | ORAL_TABLET | Freq: Every day | ORAL | 1 refills | Status: DC

## 2021-02-07 NOTE — Assessment & Plan Note (Signed)
Flu shot today 

## 2021-02-07 NOTE — Assessment & Plan Note (Signed)
Patient takes flexeril once daily for back pain. Says that with this medication the pain is under control. - continue flexeril 10 once daily

## 2021-02-07 NOTE — Assessment & Plan Note (Signed)
Patient's blood pressure elevated today initially but came down on repeat to 130/64. Continue with current management - hyzaar 100-25 - amlodipine 10

## 2021-02-07 NOTE — Progress Notes (Signed)
   CC: knee pain  HPI:  [name] is a 87 y.o. PMH noted below, who presents to the [facility] with complaints of knee pain. To see the management of his acute and chronic conditions, please refer to the A&P note under the encounters tab.   Past Medical History:  Diagnosis Date   Anemia    Normal EGG, colonoscopy, and Sm. Bowel F/T 2006   Bilateral hand pain 07/09/2016   Chronic knee pain    Chronic renal failure    Degenerative joint disease    Diabetes mellitus 11/05   type II   Diabetic peripheral neuropathy (HCC)    Duodenitis    Gastritis    Health maintenance examination    Mammogram 10/11: Possible mass, right breast. No evidence of malignancy in left breast. Needle biopsy of left breast 12/11: No evidence of  carcinoma. Patient needs follow-up right breast diagnostic mammogram with ultrasound in 6 months.   History of diabetic gastroparesis 11/28/2017   Hyperlipidemia    Hypertension    Left knee pain 10/06/2017   Osteoporosis 09/2009   T score -2.8   Psychophysiological insomnia 11/15/2016   Vertigo    Review of Systems:  Positive for numbness and paraesthesias   Physical Exam: Gen: Elderly woman sitting in a wheelchair in NAD HEENT: normocephalic atraumatic, MMM, neck supple, sclerae anicteric CV: RRR, no m/r/g   Resp: CTAB, normal WOB  GI: soft, nontender MSK: moves all extremities without difficulty Skin:warm and dry Neuro:alert answering questions appropriately Psych: normal affect   Assessment & Plan:   See Encounters Tab for problem based charting.  Patient seen with Dr. Heide Spark

## 2021-02-07 NOTE — Assessment & Plan Note (Signed)
Patient had knee injections in August, not due again for injections today. She endorses improvement after injections but the improvement has worn off. Patient would want more injections in the future, but unclear whether she had enough benefit from the prior injections to warrant repeat injections. Patient declined PT referral but can discuss again at future appointments.  - consider injections in the future - continue voltaren gel

## 2021-02-07 NOTE — Assessment & Plan Note (Signed)
Ordered a BMP today, will follow up Cr

## 2021-02-07 NOTE — Assessment & Plan Note (Addendum)
A1c remains under good control at 6.1 today. Will continue with current management. Due for an eye exam.  - continue metformin 500 BID - continue gabapentin 400 BID for neuropathy - ophthalmology referral placed

## 2021-02-07 NOTE — Patient Instructions (Addendum)
Wanda Mclaughlin  It was a pleasure seeing you in the clinic today.   We talked about your knee pain, your diabetes, your blood pressure, and the numbness you have in your fingers. We also gave you a flu shot.  Knee pain It is too soon to consider getting more injections. Since you did not have relief for very long you may not have a lot of benefit for repeat injections. However you can discuss this again next month. If you change your mind about physical therapy we can put a referral in for you.  2. Diabetes Your sugars looked good today, continue taking your metformin. I will place a referral for you to have your eyes examined.  3. Numbness in your hands We will check some blood work today and call you if it is abnormal. Continue using your gabapentin  4. Back pain I will refill your muscle relaxer  5. Ferrous sulfate (iron) pills I am not sure that you need to be taking these anymore. I will check your blood work and give you a call to let you know if you can stop taking them.  Please call our clinic at [phone] if you have any questions or concerns. The best time to call is Monday-Friday from 9am-4pm, but there is someone available 24/7 at the same number. If you need medication refills, please notify your pharmacy one week in advance and they will send [place] a request.   You can follow up with [place] again in one month in clinic.   Thank you for letting us take part in your care. We look forward to seeing you next time!

## 2021-02-08 LAB — BMP8+ANION GAP
Anion Gap: 16 mmol/L (ref 10.0–18.0)
BUN/Creatinine Ratio: 15 (ref 12–28)
BUN: 18 mg/dL (ref 8–27)
CO2: 24 mmol/L (ref 20–29)
Calcium: 9.8 mg/dL (ref 8.7–10.3)
Chloride: 99 mmol/L (ref 96–106)
Creatinine, Ser: 1.24 mg/dL — ABNORMAL HIGH (ref 0.57–1.00)
Glucose: 112 mg/dL — ABNORMAL HIGH (ref 70–99)
Potassium: 3.6 mmol/L (ref 3.5–5.2)
Sodium: 139 mmol/L (ref 134–144)
eGFR: 42 mL/min/{1.73_m2} — ABNORMAL LOW (ref >59)

## 2021-02-08 LAB — CBC
Hematocrit: 33.6 % — ABNORMAL LOW (ref 34.0–46.6)
Hemoglobin: 11.7 g/dL (ref 11.1–15.9)
MCH: 33.8 pg — ABNORMAL HIGH (ref 26.6–33.0)
MCHC: 34.8 g/dL (ref 31.5–35.7)
MCV: 97 fL (ref 79–97)
Platelets: 251 10*3/uL (ref 150–450)
RBC: 3.46 x10E6/uL — ABNORMAL LOW (ref 3.77–5.28)
RDW: 12.9 % (ref 11.7–15.4)
WBC: 5.7 10*3/uL (ref 3.4–10.8)

## 2021-02-08 LAB — VITAMIN B12: Vitamin B-12: 402 pg/mL (ref 232–1245)

## 2021-02-08 NOTE — Addendum Note (Signed)
Addended by: Ree Kida on: 02/08/2021 09:03 AM   Modules accepted: Orders

## 2021-02-08 NOTE — Progress Notes (Signed)
Internal Medicine Clinic Attending  I saw and evaluated the patient.  I personally confirmed the key portions of the history and exam documented by Dr. DeMaio   and I reviewed pertinent patient test results.  The assessment, diagnosis, and plan were formulated together and I agree with the documentation in the resident's note.  

## 2021-02-09 ENCOUNTER — Encounter: Payer: Self-pay | Admitting: *Deleted

## 2021-02-09 NOTE — Progress Notes (Unsigned)

## 2021-02-12 ENCOUNTER — Telehealth: Payer: Self-pay | Admitting: Internal Medicine

## 2021-02-12 NOTE — Telephone Encounter (Signed)
Things That May Be Affecting Your Health:  Alcohol  Hearing loss  Pain    Depression  Home Safety  Sexual Health  x Diabetes  Lack of physical activity  Stress   Difficulty with daily activities  Loneliness  Tiredness   Drug use  Medicines  Tobacco use   Falls  Motor Vehicle Safety  Weight  x Food choices  Oral Health  Other    YOUR PERSONALIZED HEALTH PLAN : 1. Schedule your next subsequent Medicare Wellness visit in one year 2. Attend all of your regular appointments to address your medical issues 3. Complete the preventative screenings and services   Annual Wellness Visit   Medicare Covered Preventative Screenings and Services  Services & Screenings Men and Women Who How Often Need? Date of Last Service Action  Abdominal Aortic Aneurysm Adults with AAA risk factors Once  @HMIMMADMINDATE    Alcohol Misuse and Counseling All Adults Screening once a year if no alcohol misuse. Counseling up to 4 face to face sessions.     Bone Density Measurement  Adults at risk for osteoporosis Once every 2 yrs y @HMIMMADMINDATE ([id number])@    Lipid Panel Z13.6 All adults without CV disease Once every 5 yrs  @HMIMMADMINDATE     Colorectal Cancer  Stool sample or Colonoscopy All adults 50 and older  Once every year Every 10 years  @HMIMMADMINDATE ([id number])@ @HMIMMADMINDATE @HMIMMADMINDATE ([id number])@    Depression All Adults Once a year  Today   Diabetes Screening Blood glucose, post glucose load, or GTT Z13.1 All adults at risk Pre-diabetics Once per year Twice per year  @HMIMMADMINDATE    Diabetes  Self-Management Training All adults Diabetics 10 hrs first year; 2 hours subsequent years. Requires Copay     Glaucoma Diabetics Family history of glaucoma African Americans 50 yrs + Hispanic Americans 65 yrs + Annually - requires coppay y @HMIMMADMINDATE ([id number])@    Hepatitis C Z72.89 or F19.20 High Risk for HCV Born between  1945 and 1965 Annually Once      HIV Z11.4 All adults based on risk Annually btw ages 68 & 36 regardless of risk Annually > 65 yrs if at increased risk      Lung Cancer Screening Asymptomatic adults aged 58-77 with 30 pack yr history and current smoker OR quit within the last 15 yrs Annually Must have counseling and shared decision making documentation before first screen      Medical Nutrition Therapy Adults with  Diabetes Renal disease Kidney transplant within past 3 yrs 3 hours first year; 2 hours subsequent years     Obesity and Counseling All adults Screening once a year Counseling if BMI 30 or higher  Today   Tobacco Use Counseling Adults who use tobacco  Up to 8 visits in one year     Vaccines Z23 Hepatitis B Influenza  Pneumonia  Adults  Once Once every flu season Two different vaccines separated by one year     Next Annual Wellness Visit People with Medicare Every year y Today     Services & Screenings Women Who How Often Need  Date of Last Service Action  Mammogram  Z12.31 Women over 40 One baseline ages 23-39. Annually ager 40 yrs+      Pap tests All women Annually if high risk. Every 2 yrs for normal risk women      Screening for cervical cancer with  Pap (Z01.419 nl or Z01.411abnl) & HPV Z11.51 Women aged 59 to 82 Once every 5 yrs  Screening pelvic and breast exams All women Annually if high risk. Every 2 yrs for normal risk women     Sexually Transmitted Diseases Chlamydia Gonorrhea Syphilis All at risk adults Annually for non pregnant females at increased risk         Services & Screenings Men Who How Ofter Need  Date of Last Service Action  Prostate Cancer - DRE & PSA Men over 50 Annually.  DRE might require a copay.        Sexually Transmitted Diseases Syphilis All at risk adults Annually for men at increased risk      Health Maintenance List Health Maintenance  Topic Date Due   Zoster Vaccines- Shingrix (1 of 2) Never done    OPHTHALMOLOGY EXAM  10/31/2016   COVID-19 Vaccine (3 - Booster for Pfizer series) 04/20/2020   HEMOGLOBIN A1C  08/08/2021   FOOT EXAM  12/07/2021   TETANUS/TDAP  07/11/2029   INFLUENZA VACCINE  Completed   DEXA SCAN  Completed   HPV VACCINES  Aged Out   LIPID PANEL  Discontinued

## 2021-03-02 ENCOUNTER — Other Ambulatory Visit: Payer: Self-pay | Admitting: Internal Medicine

## 2021-03-07 ENCOUNTER — Ambulatory Visit (HOSPITAL_COMMUNITY)
Admission: RE | Admit: 2021-03-07 | Discharge: 2021-03-07 | Disposition: A | Discharge status: Home / Self Care | Payer: Medicare Other | Source: Ambulatory Visit | Attending: Student in an Organized Health Care Education/Training Program | Admitting: Student in an Organized Health Care Education/Training Program

## 2021-03-07 ENCOUNTER — Ambulatory Visit (INDEPENDENT_AMBULATORY_CARE_PROVIDER_SITE_OTHER): Payer: Medicare Other | Admitting: Internal Medicine

## 2021-03-07 ENCOUNTER — Encounter: Payer: Self-pay | Admitting: Internal Medicine

## 2021-03-07 ENCOUNTER — Other Ambulatory Visit: Payer: Self-pay

## 2021-03-07 VITALS — BP 147/90 | HR 88 | Temp 99.4°F | Ht 63.0 in | Wt 167.1 lb

## 2021-03-07 DIAGNOSIS — Z7984 Long term (current) use of oral hypoglycemic drugs: Secondary | ICD-10-CM

## 2021-03-07 DIAGNOSIS — M62830 Muscle spasm of back: Secondary | ICD-10-CM | POA: Diagnosis not present

## 2021-03-07 DIAGNOSIS — I10 Essential (primary) hypertension: Secondary | ICD-10-CM | POA: Diagnosis not present

## 2021-03-07 DIAGNOSIS — M1180 Other specified crystal arthropathies, unspecified site: Secondary | ICD-10-CM

## 2021-03-07 DIAGNOSIS — M1711 Unilateral primary osteoarthritis, right knee: Secondary | ICD-10-CM | POA: Diagnosis not present

## 2021-03-07 DIAGNOSIS — M170 Bilateral primary osteoarthritis of knee: Secondary | ICD-10-CM

## 2021-03-07 DIAGNOSIS — E1140 Type 2 diabetes mellitus with diabetic neuropathy, unspecified: Secondary | ICD-10-CM | POA: Diagnosis not present

## 2021-03-07 DIAGNOSIS — M25561 Pain in right knee: Secondary | ICD-10-CM | POA: Diagnosis not present

## 2021-03-07 DIAGNOSIS — M25562 Pain in left knee: Secondary | ICD-10-CM | POA: Diagnosis not present

## 2021-03-07 DIAGNOSIS — M11261 Other chondrocalcinosis, right knee: Secondary | ICD-10-CM | POA: Diagnosis not present

## 2021-03-07 DIAGNOSIS — M25461 Effusion, right knee: Secondary | ICD-10-CM | POA: Diagnosis not present

## 2021-03-07 MED ORDER — CYCLOBENZAPRINE HCL 10 MG PO TABS: 10.0000 mg | ORAL_TABLET | Freq: Every day | ORAL | 0 refills | Status: DC

## 2021-03-07 NOTE — Assessment & Plan Note (Addendum)
Blood pressure of 147/90 today.  BPs historically have been in the 130s-140s range systolic with a Goal of <150/90.  Patient is asymptomatic at this time.  P: Continue hyzaar 100-25 mg, amlodipine 10 mg

## 2021-03-07 NOTE — Patient Instructions (Addendum)
Thank you, [name] for allowing [place] to provide your care today. Today we discussed your knee pain and medication refills.  1) I refilled your flexeril medication. You can call our clinic when you are low on this medication for another refill.  2) We will obtain x-rays of your knees today and call you with the results and our plan from there. For now, continue Tylenol as needed. We discussed that, at this point, you may not benefit from steroid injections. We will not refer you to PT.    I have ordered the following labs for you:  Lab Orders  No laboratory test(s) ordered today     Tests ordered today:  Left knee and right knee x-rays  Referrals ordered today:   Referral Orders  No referral(s) requested today     I have ordered the following medication/changed the following medications:   Stop the following medications: Medications Discontinued During This Encounter  Medication Reason   cyclobenzaprine (FLEXERIL) 10 MG tablet Reorder     Start the following medications: Meds ordered this encounter  Medications   cyclobenzaprine (FLEXERIL) 10 MG tablet    Sig: Take 1 tablet (10 mg total) by mouth daily. As needed for muscle spasm    Dispense:  30 tablet    Refill:  0    DX Code Needed  .     Follow up in 3 months.   Should you have any questions or concerns please call the internal medicine clinic at [phone].

## 2021-03-07 NOTE — Assessment & Plan Note (Addendum)
Patient had knee injections last August.  Tylenol provides some relief, but she does still have pain after taking Tylenol.  She states that the injections brought her about 1 month of relief until the pain resumed again.  We discussed that injections are not likely to provide any benefit at this point.  Patient is not interested in physical therapy. Bedside ultrasound performed did not show any drainable effusions. We will obtain left and right knee x-rays today and contact the patient with these results.  Otherwise, continue conservative management.  Addendum: Called the patient with x-ray results.  We discussed that x-ray showed severe osteoarthritis bilaterally.  We do not recommend repeat steroid injections as we do not believe that these would provide much benefit at this point.  Continue conservative management.  Follow-up in 3 months.

## 2021-03-07 NOTE — Assessment & Plan Note (Signed)
A1c remains under good control at 6.1 in October 2022.  We will continue with current diabetic regimen.  She is due for an eye exam and was referred during her last visit.  She states that she has an appointment set up for early December.  P: Continue metformin 500 twice daily, gabapentin 400 twice daily for neuropathy.  Patient will follow-up with ophthalmology next month.

## 2021-03-07 NOTE — Assessment & Plan Note (Signed)
Patient takes Flexeril once daily for back pain.  Endorses adequate relief with this medication.  Pain is currently under control.  P: Refilled Flexeril 10 mg once daily.

## 2021-03-07 NOTE — Progress Notes (Signed)
   CC: routine check-up  HPI:  [name] is a 87 y.o. with a PMHx as listed below who presents to the clinic today for routine follow-up. Please see problem-based list for further details, assessments, and plans.  Past Medical History:  Diagnosis Date   Anemia    Normal EGG, colonoscopy, and Sm. Bowel F/T 2006   Bilateral hand pain 07/09/2016   Chronic knee pain    Chronic renal failure    Degenerative joint disease    Diabetes mellitus 11/05   type II   Diabetic peripheral neuropathy (HCC)    Duodenitis    Gastritis    Health maintenance examination    Mammogram 10/11: Possible mass, right breast. No evidence of malignancy in left breast. Needle biopsy of left breast 12/11: No evidence of  carcinoma. Patient needs follow-up right breast diagnostic mammogram with ultrasound in 6 months.   History of diabetic gastroparesis 11/28/2017   Hyperlipidemia    Hypertension    Left knee pain 10/06/2017   Osteoporosis 09/2009   T score -2.8   Psychophysiological insomnia 11/15/2016   Vertigo    Review of Systems:  Review of Systems  Constitutional: Negative.   HENT: Negative.    Respiratory: Negative.    Cardiovascular: Negative.   Gastrointestinal: Negative.   Genitourinary: Negative.   Musculoskeletal:  Positive for back pain and joint pain.  Skin: Negative.   Neurological: Negative.   Endo/Heme/Allergies: Negative.   Psychiatric/Behavioral: Negative.      Physical Exam:  Vitals:   03/07/21 1012  BP: (!) 147/90  Pulse: 88  Temp: 99.4 F (37.4 C)  TempSrc: Oral  SpO2: 100%  Weight: 167 lb 1.6 oz (75.8 kg)  Height: 5\' 3"  (1.6 m)   Physical Exam Constitutional:      General: She is not in acute distress.    Appearance: Normal appearance.  HENT:     Head: Normocephalic and atraumatic.  Eyes:     Extraocular Movements: Extraocular movements intact.  Cardiovascular:     Rate and Rhythm: Normal rate and regular rhythm.     Heart sounds: No murmur heard.   No  friction rub. No gallop.  Pulmonary:     Effort: Pulmonary effort is normal. No respiratory distress.     Breath sounds: Normal breath sounds. No wheezing, rhonchi or rales.  Abdominal:     General: Abdomen is flat. There is no distension.  Musculoskeletal:     Comments: Left knee: TTP on medial aspect of the knee. No effusion or erythema. Anterior crepitus is present. Right knee: TTP on medial aspect of the knee. No effusion or erythema.  Skin:    General: Skin is warm and dry.  Neurological:     General: No focal deficit present.     Mental Status: She is alert and oriented to person, place, and time. Mental status is at baseline.  Psychiatric:        Mood and Affect: Mood normal.        Behavior: Behavior normal.     Assessment & Plan:   See Encounters Tab for problem based charting.  Patient seen with Dr. 

## 2021-03-08 NOTE — Progress Notes (Signed)
Internal Medicine Clinic Attending  I saw and evaluated the patient.  I personally confirmed the key portions of the history and exam documented by Dr. Alroy Bailiff and I reviewed pertinent patient test results.  The assessment, diagnosis, and plan were formulated together and I agree with the documentation in the resident's note.   Severe bilateral knee osteoarthritis with weight bearing xrays showing bone-on-bone. No joint effusion today. I doubt intraarticular steroid injections are going to add much benefit anymore, and given her diabetes and advanced age, I think they probably pose more risk than benefit. Would recommend conservative therapy at this point. Given very advanced age, I think she would not be a good candidate for knee replacement surgery. In the future, would focus on supportive care, maximize function with PT and medical equipment, and reduce fall risk. A geniculate nerve block may be useful on her left side as she seems to have point tenderness around that distribution, perhaps sports medicine clinic could trial that.

## 2021-04-06 LAB — HM DIABETES EYE EXAM

## 2021-05-30 ENCOUNTER — Other Ambulatory Visit: Payer: Self-pay | Admitting: Internal Medicine

## 2021-07-12 ENCOUNTER — Other Ambulatory Visit: Payer: Self-pay | Admitting: Student

## 2021-08-14 ENCOUNTER — Encounter: Payer: Self-pay | Admitting: Internal Medicine

## 2021-08-14 ENCOUNTER — Ambulatory Visit (INDEPENDENT_AMBULATORY_CARE_PROVIDER_SITE_OTHER): Payer: Medicare Other | Admitting: Internal Medicine

## 2021-08-14 ENCOUNTER — Other Ambulatory Visit: Payer: Self-pay

## 2021-08-14 VITALS — BP 144/75 | HR 91 | Temp 98.4°F | Ht 63.0 in | Wt 175.9 lb

## 2021-08-14 DIAGNOSIS — E1122 Type 2 diabetes mellitus with diabetic chronic kidney disease: Secondary | ICD-10-CM | POA: Diagnosis not present

## 2021-08-14 DIAGNOSIS — M1180 Other specified crystal arthropathies, unspecified site: Secondary | ICD-10-CM

## 2021-08-14 DIAGNOSIS — N1832 Chronic kidney disease, stage 3b: Secondary | ICD-10-CM | POA: Diagnosis not present

## 2021-08-14 DIAGNOSIS — I129 Hypertensive chronic kidney disease with stage 1 through stage 4 chronic kidney disease, or unspecified chronic kidney disease: Secondary | ICD-10-CM

## 2021-08-14 DIAGNOSIS — M62830 Muscle spasm of back: Secondary | ICD-10-CM

## 2021-08-14 DIAGNOSIS — I10 Essential (primary) hypertension: Secondary | ICD-10-CM

## 2021-08-14 DIAGNOSIS — E1140 Type 2 diabetes mellitus with diabetic neuropathy, unspecified: Secondary | ICD-10-CM | POA: Diagnosis not present

## 2021-08-14 LAB — POCT GLYCOSYLATED HEMOGLOBIN (HGB A1C): Hemoglobin A1C: 6.2 % — AB (ref 4.0–5.6)

## 2021-08-14 LAB — GLUCOSE, CAPILLARY: Glucose-Capillary: 113 mg/dL — ABNORMAL HIGH (ref 70–99)

## 2021-08-14 NOTE — Assessment & Plan Note (Addendum)
Patient does not check her blood sugar at home. She has had no symptomatic hypoglycemia. Last HbA1c was 6.1% in October 2022. ?Assessment:Current regimen of metformin 500 mg BID is maintaining her diabetic control well. ?Plan:Will obtain HbA1c today. If this is unchanged or even improved, I will have the patient decrease her metformin 500 mg from twice daily to once daily. Can consider further eliminating metformin from her regimen if glycemic control is still appropriate at next check up. ? ?ADDENDUM: HbA1c is essentially unchanged from prior at 6.2%. I have called the patient and advised her to decrease metformin to once daily. ?

## 2021-08-14 NOTE — Progress Notes (Addendum)
? ?CC: check-up for HTN, T2DM ? ?HPI: ? ?[name] is a 88 y.o. female with past medical history as detailed below who presents today for routine follow-up. Please see problem based charting for detailed assessment and plan. ? ?Past Medical History:  ?Diagnosis Date  ? Anemia   ? Normal EGG, colonoscopy, and Sm. Bowel F/T 2006  ? Bereavement 08/18/2020  ? Bilateral hand pain 07/09/2016  ? Chronic knee pain   ? Chronic renal failure   ? Degenerative joint disease   ? Diabetes mellitus 11/05  ? type II  ? Diabetic peripheral neuropathy ([place])   ? Duodenitis   ? Gastritis   ? Health care maintenance 03/15/2013  ? Health maintenance examination   ? Mammogram 10/11: Possible mass, right breast. No evidence of malignancy in left breast. Needle biopsy of left breast 12/11: No evidence of  carcinoma. Patient needs follow-up right breast diagnostic mammogram with ultrasound in 6 months.  ? History of diabetic gastroparesis 11/28/2017  ? Hyperlipidemia   ? Hypertension   ? Left knee pain 10/06/2017  ? Osteoporosis 09/2009  ? T score -2.8  ? Psychophysiological insomnia 11/15/2016  ? Vertigo   ? ?Review of Systems:   ?Review of Systems  ?Constitutional:  Negative for malaise/fatigue.  ?Respiratory:  Negative for shortness of breath and wheezing.   ?Cardiovascular:  Negative for chest pain and leg swelling.  ?Musculoskeletal:  Positive for back pain, joint pain and myalgias. Negative for falls.  ?Neurological:  Negative for dizziness, sensory change, focal weakness and headaches.  ? ?Physical Exam: ? ?Vitals:  ? 08/14/21 0921 08/14/21 0935  ?BP: (!) 154/87 (!) 144/75  ?Pulse: 94 91  ?Temp: 98.4 ?F (36.9 ?C)   ?TempSrc: Oral   ?SpO2: 95%   ?Weight: 175 lb 14.4 oz (79.8 kg)   ?Height: 5\' 3"  (1.6 m)   ? ?Constitutional:Elderly female sitting in exam room in no acute distress. ?Cardio:Regular rate and rhythm.  ?Pulm:Clear to auscultation bilaterally. Normal work of breathing on room air. ?Abdomen:Soft, nontender,  nondistended. ?[id number] knee joints are without erythema, edema, or effusion. No tenderness to palpation of these joints. Full active and passive range of motion. No crepitus noted. ?Skin:Warm and dry. ?Neuro:Alert and oriented, no focal deficit noted. ?Psych:Normal mood and affect. ? ?Assessment & Plan:  ? ?Essential hypertension ?BP looks great today, initial check of 154/87 which improved to 144/75 on recheck. Goal for [name] is <150/90. Currently managed with losartan-HCTZ 100-25 mg daily and amlodipine 10 mg daily. She has not had weakness, falls, dizziness. ?Assessment:BP control is appropriate at this time. ?Plan:Continue  losartan-HCTZ 100-25 mg daily and amlodipine 10 mg daily. Will recheck BMP today to monitor for any changes in renal function given additional history of CKD stage 3. ? ?ADDENDUM: BMP unchanged from prior. No management changes made. ? ?Diabetes mellitus with neuropathy ([place]) ?Patient does not check her blood sugar at home. She has had no symptomatic hypoglycemia. Last HbA1c was 6.1% in October 2022. ?Assessment:Current regimen of metformin 500 mg BID is maintaining her diabetic control well. ?Plan:Will obtain HbA1c today. If this is unchanged or even improved, I will have the patient decrease her metformin 500 mg from twice daily to once daily. Can consider further eliminating metformin from her regimen if glycemic control is still appropriate at next check up. ? ?ADDENDUM: HbA1c is essentially unchanged from prior at 6.2%. I have called the patient and advised her to decrease metformin to once daily. ? ?Calcium pyrophosphate crystal arthritis +  osteoarthritis ?Patient describes continued pain in bilateral knees from her arthritis. She sometimes uses knee braces when she walks around but does not use them daily. She is able to ambulate around the home and outside with the help of her cane. She still does cleaning around the home including mopping, dusting, though she does admit her  ability to do in depth cleaning and cooking tasks has decreased as she has gotten older. She does have a handle in her shower/bath tub and does not sit down in the tub. She avoids having objects on the floors in her home including cords that she could easily trip over. ? ?She is currently using tylenol and Voltaren gel for conservative management of her pain. She is not a good candidate for knee replacement surgeries and would likely not benefit from steroid injections given her disease severity. ? ?Assessment:Patient seems to be tolerating current conservative management well though she is still symptomatic. I have counseled her on why other pain management modalities such as stronger pain medication, knee injections, and surgery are not appropriate for her. I have counseled her that she can use the Voltaren gel on her knees up to 4 times per day if needed. ?Plan:Continue with conservative management. ? ?Spasm of back muscles ?Patient endorses continuing to experience occasional back and leg muscle spasms. She currently has flexeril 10 mg daily for her spasms and states that she takes this only as needed.  ?Assessment:I have counseled the patient on careful use of Flexeril as it can increase her fall risk and lead to confusion in older adults. She recently picked up a refill on 04/05. ?Plan:Continue Flexeril 10 mg daily PRN muscle spasms. ? ? ?See Encounters Tab for problem based charting. ? ?Patient discussed with Dr.  Saverio Danker ? ?

## 2021-08-14 NOTE — Assessment & Plan Note (Signed)
Patient endorses continuing to experience occasional back and leg muscle spasms. She currently has flexeril 10 mg daily for her spasms and states that she takes this only as needed.  ?Assessment:I have counseled the patient on careful use of Flexeril as it can increase her fall risk and lead to confusion in older adults. She recently picked up a refill on 04/05. ?Plan:Continue Flexeril 10 mg daily PRN muscle spasms. ?

## 2021-08-14 NOTE — Assessment & Plan Note (Signed)
Patient describes continued pain in bilateral knees from her arthritis. She sometimes uses knee braces when she walks around but does not use them daily. She is able to ambulate around the home and outside with the help of her cane. She still does cleaning around the home including mopping, dusting, though she does admit her ability to do in depth cleaning and cooking tasks has decreased as she has gotten older. She does have a handle in her shower/bath tub and does not sit down in the tub. She avoids having objects on the floors in her home including cords that she could easily trip over. ? ?She is currently using tylenol and Voltaren gel for conservative management of her pain. She is not a good candidate for knee replacement surgeries and would likely not benefit from steroid injections given her disease severity. ? ?Assessment:Patient seems to be tolerating current conservative management well though she is still symptomatic. I have counseled her on why other pain management modalities such as stronger pain medication, knee injections, and surgery are not appropriate for her. I have counseled her that she can use the Voltaren gel on her knees up to 4 times per day if needed. ?Plan:Continue with conservative management. ?

## 2021-08-14 NOTE — Patient Instructions (Addendum)
It was nice seeing you today! Thank you for choosing [facility] for your Primary Care.  ?  ?Today we talked about:  ? ?1.Blood pressure: Your blood pressure was a little high when you first came in but it was normal before you left. I think you are doing a great job with your current medicines! ? - Medication changes:None. Please keep taking amlodipine 10 mg daily and losartan-hydrochlorothiazide 100-25 mg daily.  ? - Labs today:BMP, which is a lab to check your kidney function ? ?2. Diabetes: Keep up the great work taking your diabetes medicine every day. ?- Medication changes:None. Please keep taking metformin 500 mg twice daily for your blood sugars. ?- Labs today:HbA1c ? ?3.Back spasms: I am sorry that you continue to have back spasms but I am glad to hear that you have not needed relief from your flexeril medication very often. As we discussed, I want you to be careful when you take this medication. I do not want you to have an increased risk of falling or confusion. ? - Medication changes:I have refilled your flexeril. ? ?Please follow up in 3 months. ? ?Remember: If you have any questions or concerns, please call our clinic at [phone] between 9am-5pm and after hours call [phone] and ask for the internal medicine resident on call. If you feel you are having a medical emergency please call 911. ? ?Champ Mungo, DO ? ?

## 2021-08-14 NOTE — Assessment & Plan Note (Addendum)
BP looks great today, initial check of 154/87 which improved to 144/75 on recheck. Goal for Ms. Paulino is <150/90. Currently managed with losartan-HCTZ 100-25 mg daily and amlodipine 10 mg daily. She has not had weakness, falls, dizziness. ?Assessment:BP control is appropriate at this time. ?Plan:Continue  losartan-HCTZ 100-25 mg daily and amlodipine 10 mg daily. Will recheck BMP today to monitor for any changes in renal function given additional history of CKD stage 3. ? ?ADDENDUM: BMP unchanged from prior. No management changes made. ?

## 2021-08-15 LAB — BMP8+ANION GAP
Anion Gap: 15 mmol/L (ref 10.0–18.0)
BUN/Creatinine Ratio: 15 (ref 12–28)
BUN: 18 mg/dL (ref 10–36)
CO2: 24 mmol/L (ref 20–29)
Calcium: 9.5 mg/dL (ref 8.7–10.3)
Chloride: 104 mmol/L (ref 96–106)
Creatinine, Ser: 1.2 mg/dL — ABNORMAL HIGH (ref 0.57–1.00)
Glucose: 122 mg/dL — ABNORMAL HIGH (ref 70–99)
Potassium: 4.1 mmol/L (ref 3.5–5.2)
Sodium: 143 mmol/L (ref 134–144)
eGFR: 43 mL/min/{1.73_m2} — ABNORMAL LOW (ref >59)

## 2021-08-15 MED ORDER — METFORMIN HCL 500 MG PO TABS: 500.0000 mg | ORAL_TABLET | Freq: Every day | ORAL | 2 refills | Status: DC

## 2021-08-15 NOTE — Progress Notes (Signed)
Internal Medicine Clinic Attending ? ?Case discussed with Dr. Dean  At the time of the visit.  We reviewed the resident?s history and exam and pertinent patient test results.  I agree with the assessment, diagnosis, and plan of care documented in the resident?s note.  ?

## 2021-08-15 NOTE — Addendum Note (Signed)
Addended byDickie La on: 08/15/2021 11:22 AM ? ? Modules accepted: Level of Service ? ?

## 2021-08-15 NOTE — Addendum Note (Signed)
Addended by: Renato Battles on: 08/15/2021 01:04 PM ? ? Modules accepted: Orders ? ?

## 2021-08-27 ENCOUNTER — Other Ambulatory Visit: Payer: Self-pay | Admitting: Internal Medicine

## 2021-08-27 DIAGNOSIS — I10 Essential (primary) hypertension: Secondary | ICD-10-CM

## 2021-09-20 ENCOUNTER — Other Ambulatory Visit: Payer: Self-pay | Admitting: Internal Medicine

## 2021-09-20 DIAGNOSIS — M62830 Muscle spasm of back: Secondary | ICD-10-CM

## 2021-09-20 DIAGNOSIS — I10 Essential (primary) hypertension: Secondary | ICD-10-CM

## 2021-10-16 ENCOUNTER — Ambulatory Visit (INDEPENDENT_AMBULATORY_CARE_PROVIDER_SITE_OTHER): Payer: Medicare Other | Admitting: Student

## 2021-10-16 ENCOUNTER — Other Ambulatory Visit: Payer: Self-pay

## 2021-10-16 ENCOUNTER — Encounter: Payer: Self-pay | Admitting: Student

## 2021-10-16 DIAGNOSIS — R21 Rash and other nonspecific skin eruption: Secondary | ICD-10-CM

## 2021-10-16 DIAGNOSIS — L282 Other prurigo: Secondary | ICD-10-CM | POA: Insufficient documentation

## 2021-10-16 DIAGNOSIS — I10 Essential (primary) hypertension: Secondary | ICD-10-CM | POA: Diagnosis not present

## 2021-10-16 MED ORDER — HYDROCORTISONE 1 % EX CREA: 1.0000 | TOPICAL_CREAM | Freq: Three times a day (TID) | CUTANEOUS | 1 refills | Status: DC

## 2021-10-16 NOTE — Patient Instructions (Signed)
Ms. Goga,  It was a pleasure seeing you in the clinic today.   I have ordered hydrocortisone (steroid) cream to help with your itchy rash. Please apply a thin layer of cream to the areas where you have a rash. You can apply the cream up to three times a day as needed. Please come back in 1 week to make sure your rash is getting better. If your rash worsens, becomes painful, or starts spreading more, please call our clinic.  Please call our clinic at [phone] if you have any questions or concerns. The best time to call is Monday-Friday from 9am-4pm, but there is someone available 24/7 at the same number. If you need medication refills, please notify your pharmacy one week in advance and they will send [place] a request.   Thank you for letting us take part in your care. We look forward to seeing you next time!

## 2021-10-16 NOTE — Assessment & Plan Note (Signed)
Today's Vitals   10/16/21 1100 10/16/21 1101  BP:  (!) 145/73  Pulse:  83  Temp:  98.4 F (36.9 C)  TempSrc:  Oral  SpO2:  98%  Weight: 174 lb 3.2 oz (79 kg)   PainSc:  8    Body mass index is 30.86 kg/m.  Patient with HTN, currently taking losartan-hctz 100-25mg  daily and norvasc 10mg  daily. BP at goal of <150/90. Continue current medications.

## 2021-10-16 NOTE — Progress Notes (Signed)
   CC: evaluation of rash  HPI:  Wanda Mclaughlin Record is a 88 y.o. female with history listed below presenting to the [facility] for evaluation of rash. Please see individualized problem based charting for full HPI.  Past Medical History:  Diagnosis Date   Anemia    Normal EGG, colonoscopy, and Sm. Bowel F/T 2006   Bereavement 08/18/2020   Bilateral hand pain 07/09/2016   Chronic knee pain    Chronic renal failure    Degenerative joint disease    Diabetes mellitus 11/05   type II   Diabetic peripheral neuropathy ([facility])    Duodenitis    Gastritis    Health care maintenance 03/15/2013   Health maintenance examination    Mammogram 10/11: Possible mass, right breast. No evidence of malignancy in left breast. Needle biopsy of left breast 12/11: No evidence of  carcinoma. Patient needs follow-up right breast diagnostic mammogram with ultrasound in 6 months.   History of diabetic gastroparesis 11/28/2017   Hyperlipidemia    Hypertension    Left knee pain 10/06/2017   Osteoporosis 09/2009   T score -2.8   Psychophysiological insomnia 11/15/2016   Vertigo     Review of Systems:  Negative aside from that listed in individualized problem based charting.  Physical Exam:  Vitals:   10/16/21 1100  Weight: 174 lb 3.2 oz (79 kg)   Physical Exam Constitutional:      Appearance: Normal appearance. She is not ill-appearing.  HENT:     Mouth/Throat:     Mouth: Mucous membranes are moist.     Pharynx: Oropharynx is clear.  Eyes:     Extraocular Movements: Extraocular movements intact.     Conjunctiva/sclera: Conjunctivae normal.     Pupils: Pupils are equal, round, and reactive to light.  Cardiovascular:     Rate and Rhythm: Normal rate and regular rhythm.     Pulses: Normal pulses.     Heart sounds: Normal heart sounds. No murmur heard.    No gallop.  Pulmonary:     Effort: Pulmonary effort is normal.     Breath sounds: Normal breath sounds. No wheezing, rhonchi or rales.  Abdominal:      General: Bowel sounds are normal. There is no distension.     Palpations: Abdomen is soft.     Tenderness: There is no abdominal tenderness.  Musculoskeletal:        General: No swelling. Normal range of motion.  Skin:    General: Skin is warm and dry.     Comments: Rash present on extensor surface of bilateral knees with some lesions present on flexor region of bilateral knees as well. Some areas with vesicular lesions with clear drainage. Please see images below.  Neurological:     General: No focal deficit present.     Mental Status: She is alert and oriented to person, place, and time.  Psychiatric:        Mood and Affect: Mood normal.        Behavior: Behavior normal.         Assessment & Plan:   See Encounters Tab for problem based charting.  Patient discussed with Dr.  Sol Blazing

## 2021-10-16 NOTE — Assessment & Plan Note (Signed)
Patient presented with complaint of rash that appeared 2 weeks ago. Rash is present predominantly on extensor surface of bilateral knees with some lesions on flexor surfaces of knees. She reports that rash has been extremely itchy and has had clear fluid drainage with itching, especially on the extensor surface of right knee. She does note that she felt rash on her elbows and neck as well, but none seen during visit today. She denies any changes in home products (soaps, detergents, clothing, etc). Has not been hiking and does not have any pets at home.   On exam, patient with blanching, pruritic rash on bilateral knees with some lesions that are vesicular in appearance and draining clear fluid. Smaller lesions distributed around distal thighs and on flexor surface of knees as well. No rash noted on elbows or neck.   Given history and exam, this appears to be eczema vs contact dermatitis. Will trial a course of topical hydrocortisone cream to see if this provides relief. Will have patient come back in 1 week to reevaluate rash. Discussed return precautions (worsening rash, development of pain, spreading to other areas). Her only risk factor for immunocompromised state is advanced age.  Plan: -trial of hydrocortisone cream -f/u in 1 week

## 2021-10-20 ENCOUNTER — Encounter: Payer: Self-pay | Admitting: *Deleted

## 2021-11-06 ENCOUNTER — Other Ambulatory Visit: Payer: Self-pay | Admitting: Internal Medicine

## 2021-11-07 NOTE — Telephone Encounter (Signed)
Call patient informed her that her Iron levels were good and does not need to take additional Iron at this time.  Will obtain a Iron level at her next visit.  Patient voiced understanding of plan and will get at September 2023 appointment.

## 2021-11-29 ENCOUNTER — Telehealth: Payer: Self-pay

## 2021-11-29 NOTE — Patient Outreach (Signed)
  Care Management   Outreach Note  11/29/2021 Name: Wanda Mclaughlin MRN: [id number] DOB: [birth year]  Referred by: Rana Snare, DO Reason for referral : Care Coordination   An unsuccessful telephone outreach was attempted today. The patient was referred to the case management team for assistance with care management and care coordination.   Follow Up Plan: The care management team will reach out to the patient again over the next 30 days.   Jodelle Gross, RN, BSN, CCM Care Management Coordinator [facility] Phone: [phone]/Fax: [phone]

## 2022-01-28 ENCOUNTER — Ambulatory Visit (INDEPENDENT_AMBULATORY_CARE_PROVIDER_SITE_OTHER): Payer: Medicare Other

## 2022-01-28 ENCOUNTER — Other Ambulatory Visit: Payer: Self-pay

## 2022-01-28 VITALS — BP 149/68 | HR 86 | Temp 99.4°F | Wt 167.2 lb

## 2022-01-28 DIAGNOSIS — E1142 Type 2 diabetes mellitus with diabetic polyneuropathy: Secondary | ICD-10-CM

## 2022-01-28 DIAGNOSIS — E1140 Type 2 diabetes mellitus with diabetic neuropathy, unspecified: Secondary | ICD-10-CM

## 2022-01-28 DIAGNOSIS — L282 Other prurigo: Secondary | ICD-10-CM | POA: Diagnosis not present

## 2022-01-28 DIAGNOSIS — I10 Essential (primary) hypertension: Secondary | ICD-10-CM

## 2022-01-28 DIAGNOSIS — Z23 Encounter for immunization: Secondary | ICD-10-CM | POA: Diagnosis not present

## 2022-01-28 DIAGNOSIS — R1011 Right upper quadrant pain: Secondary | ICD-10-CM | POA: Diagnosis not present

## 2022-01-28 DIAGNOSIS — Z0000 Encounter for general adult medical examination without abnormal findings: Secondary | ICD-10-CM

## 2022-01-28 DIAGNOSIS — Z7984 Long term (current) use of oral hypoglycemic drugs: Secondary | ICD-10-CM | POA: Diagnosis not present

## 2022-01-28 DIAGNOSIS — R011 Cardiac murmur, unspecified: Secondary | ICD-10-CM | POA: Diagnosis not present

## 2022-01-28 LAB — POCT GLYCOSYLATED HEMOGLOBIN (HGB A1C): Hemoglobin A1C: 6.1 % — AB (ref 4.0–5.6)

## 2022-01-28 LAB — GLUCOSE, CAPILLARY: Glucose-Capillary: 124 mg/dL — ABNORMAL HIGH (ref 70–99)

## 2022-01-28 MED ORDER — GABAPENTIN 400 MG PO CAPS: 400.0000 mg | ORAL_CAPSULE | Freq: Two times a day (BID) | ORAL | 1 refills | Status: DC

## 2022-01-28 MED ORDER — AMLODIPINE BESYLATE 10 MG PO TABS: 10.0000 mg | ORAL_TABLET | Freq: Every day | ORAL | 1 refills | Status: DC

## 2022-01-28 MED ORDER — LOSARTAN POTASSIUM-HCTZ 100-25 MG PO TABS: 1.0000 | ORAL_TABLET | Freq: Every day | ORAL | 1 refills | Status: DC

## 2022-01-28 MED ORDER — BLOOD GLUCOSE MONITOR KIT: PACK | 0 refills | Status: DC

## 2022-01-28 NOTE — Assessment & Plan Note (Signed)
-   Influenza vaccine given today 

## 2022-01-28 NOTE — Assessment & Plan Note (Signed)
On exam, patient has 3/6 systolic murmur at the RUSB. No chest pain, SOB, dizziness, lightheadedness, headache.  Plan: - Continue to monitor

## 2022-01-28 NOTE — Progress Notes (Signed)
Patient updated on her results during her visit.

## 2022-01-28 NOTE — Assessment & Plan Note (Signed)
Patient complaining of two weeks of intermittent, non-radiating pain in her RUQ as well as right axillary region below the ribs.  She states that the pain improves with laying on her right side and worsens with sitting up.  She denies any changes to the pain with breathing or eating.  She states that the pain does improve with Flexeril and ibuprofen.  She states that she may have overstretched at some point but denies recent trauma or falls. Denies fever, chills, nausea, vomiting, diarrhea. Patient does not diffuse pruritis that has been present for several months. On exam, patient has tenderness to palpation of the RUQ near the liver and to palpation of the right axillary region below the ribs without hepatomegaly. No jaundice. Pain and pruritis potentially related to liver vs gallbladder process.     Plan: - Ordered CMP to assess liver function and bilirubin

## 2022-01-28 NOTE — Assessment & Plan Note (Signed)
Patient complaining of a pruritic rash on the flexor/extensor surfaces of bilateral knees for several months. Rash worsens after bathing but has improved since last visit in June.  Patient was prescribed hydrocortisone cream at this visit and she states this helps with the itching. Patient denies any changes to lotions, soaps, shampoos, laundry detergent. On exam, patient has darkened 3 x 3 cm area on extensor surface of right knee without erythema or drainage that is not raised and darkened 2 x 2 cm area on flexor surface of right knee without erythema or drainage that is not raised. Presentation is suspicious for atopic dermatitis given location and improvement with hydrocortisone cream.   Plan: - Continue hydrocortisone cream - Recommended OTC Sarna lotion for itching

## 2022-01-28 NOTE — Patient Instructions (Addendum)
Thank you for coming to see Wanda Mclaughlin in clinic [name].  Plan: - We will keep your blood pressure medications the same (amlodipine, losartan-hydrochlorothiazide) - We will keep your diabetes medications the same (metformin) - We will order you a glucose meter - We will refill your medications - We will check your blood work to assess your kidney function and liver function (I will call you with the results) - For your rash, continue to use hydrocortisone cream and you can try Sarna lotion that is over the counter  It was very nice to meet you.

## 2022-01-28 NOTE — Assessment & Plan Note (Addendum)
Current medications include metFORMIN 500 MG daily. Patient states that she is compliant with this medication at home. Patient is requesting a glucometer for home. Denies polydipsia, polyuria, fatigue. A1c 6.2 in 07/2021. A1c 6.1 today. Normal diabetic foot exam today.   Plan: - Continue metFORMIN 500 MG daily - Ordered glucometer for home

## 2022-01-28 NOTE — Progress Notes (Signed)
CC: 3 month follow-up  HPI:  [name] is a 88 y.o. female with past medical history of HTN, HLD, T2DM w/ neuropathy, and CKD3 that presents for a 3 month follow up.   Patient states that she has been feeling relatively well at home.  She states that she continues to take her amlodipine and losartan-hydrochlorothiazide as prescribed.  She occasionally measures her blood pressure at home and notes the systolic blood pressure is usually in the 130s. Denies headache, lightheadedness, dizziness, vision changes, chest pain, shortness of breath.  Patient states that she continues to take her metformin as prescribed. Patient is requesting a glucometer for home. Denies polydipsia, polyuria, fatigue.  Patient is complaining of a pruritic rash on the flexor/extensor surfaces of bilateral knees. States that this rash has been present for several months.  She states that the rash worsens after bathing but that it has gotten somewhat better since her last visit in June.  She states that the hydrocortisone cream she was prescribed at her most recent visit in the clinic does help with the itching.  Patient denies any changes to lotions, soaps, shampoos, laundry detergent.  Patient is also complaining of two weeks of intermittent, non-radiating pain in her right upper quadrant as well as right axillary region below the ribs.  She states that the pain improves with laying on her right side and worsens with sitting up.  She denies any changes to the pain with breathing or eating.  She states that the pain does improve with Flexeril and ibuprofen.  She states that she may have overstretched at some point but denies recent trauma or falls. Denies fever, chills, nausea, vomiting, diarrhea.  Patient is requesting the influenza vaccine today.   Allergies as of 01/28/2022       Reactions   Lisinopril    REACTION: Possi ble ANGIOEDEMA        Medication List        Accurate as of January 28, 2022  6:23  AM. If you have any questions, ask your nurse or doctor.          Accu-Chek Aviva Plus test strip Generic drug: glucose blood USE 2 TIMES DAILY TO CHECK BLOOD SUGAR   acetaminophen 325 MG tablet Commonly known as: TYLENOL Take 650 mg by mouth every 6 (six) hours as needed for mild pain.   amLODipine 10 MG tablet Commonly known as: NORVASC TAKE 1 TABLET BY MOUTH EVERY DAY   cyclobenzaprine 10 MG tablet Commonly known as: FLEXERIL TAKE 1 TABLET (10 MG TOTAL) BY MOUTH EVERY OTHER DAY. AS NEEDED FOR MUSCLE SPASM   diclofenac Sodium 1 % Gel Commonly known as: VOLTAREN Apply 2 g topically 4 (four) times daily.   gabapentin 400 MG capsule Commonly known as: NEURONTIN Take 1 capsule (400 mg total) by mouth 2 (two) times daily.   hydrocortisone cream 1 % Apply 1 Application topically 3 (three) times daily. Apply a thin layer of cream to areas with rash.   losartan-hydrochlorothiazide 100-25 MG tablet Commonly known as: HYZAAR TAKE 1 TABLET BY MOUTH EVERY DAY   Melatonin 5 MG Caps Take 1 capsule (5 mg total) by mouth at bedtime.   metFORMIN 500 MG tablet Commonly known as: GLUCOPHAGE Take 1 tablet (500 mg total) by mouth daily with breakfast.   omeprazole 20 MG capsule Commonly known as: PRILOSEC TAKE 1 CAPSULE BY MOUTH DAILY AS NEEDED.   senna 8.6 MG tablet Commonly known as: Senokot Take 2-4 tablets by  mouth at bedtime as needed for constipation.         Past Medical History:  Diagnosis Date   Anemia    Normal EGG, colonoscopy, and Sm. Bowel F/T 2006   Bereavement 08/18/2020   Bilateral hand pain 07/09/2016   Chronic knee pain    Chronic renal failure    Degenerative joint disease    Diabetes mellitus 11/05   type II   Diabetic peripheral neuropathy ([place])    Duodenitis    Gastritis    Health care maintenance 03/15/2013   Health maintenance examination    Mammogram 10/11: Possible mass, right breast. No evidence of malignancy in left breast. Needle biopsy  of left breast 12/11: No evidence of  carcinoma. Patient needs follow-up right breast diagnostic mammogram with ultrasound in 6 months.   History of diabetic gastroparesis 11/28/2017   Hyperlipidemia    Hypertension    Left knee pain 10/06/2017   Osteoporosis 09/2009   T score -2.8   Psychophysiological insomnia 11/15/2016   Vertigo    Review of Systems:  per HPI.   Physical Exam: Vitals:   01/28/22 0837  BP: (!) 159/70  Pulse: 95  Temp: 99.4 F (37.4 C)  TempSrc: Oral  SpO2: 98%  Weight: 167 lb 3.2 oz (75.8 kg)   Constitutional: appears comfortable, sitting in her wheelchair Cardiovascular: Regular rate, regular rhythm. 3/6 systolic murmur at the RUSB. No rubs or gallops. Normal radial and PT pulses bilaterally. No LE edema.  Pulmonary: Normal respiratory effort. No wheezes, rales, or rhonchi.   Abdominal: Soft. Non-distended. Normal bowel sounds. Tenderness to palpation of the RUQ near the liver. No hepatomegaly.   Musculoskeletal: Normal range of motion. Tenderness to palpation of the right axillary region below the ribs.   Neurological: Alert and oriented to person, place, and time. Non-focal. Skin: warm and dry. No jaundice. Seborrheic keratosis present diffusely on back, bilateral UE, and bilateral LE. Darkened 3 x 3 cm area on extensor surface of right knee without erythema or drainage that is not raised. Darkened 2 x 2 cm area on flexor surface of right knee without erythema or drainage that is not raised.    Assessment & Plan:   See Encounters Tab for problem based charting.  Patient seen with Dr. Jimmye Norman

## 2022-01-28 NOTE — Assessment & Plan Note (Signed)
Current medications include amLODipine 10 MG and losartan-hydrochlorothiazide 100-25 MG. States that she is compliant with these medications at home. BP initially 159/70 today in clinic. Repeat BP 149/68. Denies headache, lightheadedness, dizziness, vision changes, chest pain, shortness of breath.   Plan: - Continue amLODipine 10 MG and losartan-hydrochlorothiazide 100-25 MG

## 2022-01-29 LAB — CMP14 + ANION GAP
ALT: 11 IU/L (ref 0–32)
AST: 12 IU/L (ref 0–40)
Albumin/Globulin Ratio: 1.4 (ref 1.2–2.2)
Albumin: 4.5 g/dL (ref 3.6–4.6)
Alkaline Phosphatase: 70 IU/L (ref 44–121)
Anion Gap: 18 mmol/L (ref 10.0–18.0)
BUN/Creatinine Ratio: 16 (ref 12–28)
BUN: 22 mg/dL (ref 10–36)
Bilirubin Total: 0.3 mg/dL (ref 0.0–1.2)
CO2: 22 mmol/L (ref 20–29)
Calcium: 9.6 mg/dL (ref 8.7–10.3)
Chloride: 101 mmol/L (ref 96–106)
Creatinine, Ser: 1.35 mg/dL — ABNORMAL HIGH (ref 0.57–1.00)
Globulin, Total: 3.2 g/dL (ref 1.5–4.5)
Glucose: 125 mg/dL — ABNORMAL HIGH (ref 70–99)
Potassium: 4.1 mmol/L (ref 3.5–5.2)
Sodium: 141 mmol/L (ref 134–144)
Total Protein: 7.7 g/dL (ref 6.0–8.5)
eGFR: 37 mL/min/{1.73_m2} — ABNORMAL LOW (ref >59)

## 2022-01-29 NOTE — Progress Notes (Signed)
Internal Medicine Clinic Attending  I saw and evaluated the patient.  I personally confirmed the key portions of the history and exam documented by Dr. Alton Revere and I reviewed pertinent patient test results.  The assessment, diagnosis, and plan were formulated together and I agree with the documentation in the resident's note. I'm not sure what to make of the bilateral itching medial knee rash, which at this point consists only of hyperpigmented spots where small lesions (described as starting as clear very pruritic vesicles).  The itching has persisted.  SKin is well hydrated and supple.  She may benefit from a camphor/menthol topical lotion.  Her back is itchy due to dry skin and multiple seb keratoses.  Daughter quite concerned; liver and renal panel ordered for reassurance.

## 2022-01-30 NOTE — Progress Notes (Signed)
I called the patient and updated her on the results of her mildly elevated creatinine on CMP that is near her baseline. Encouraged PO hydration.

## 2022-02-06 ENCOUNTER — Other Ambulatory Visit: Payer: Self-pay | Admitting: Student

## 2022-02-06 DIAGNOSIS — Z0000 Encounter for general adult medical examination without abnormal findings: Secondary | ICD-10-CM

## 2022-02-22 ENCOUNTER — Other Ambulatory Visit: Payer: Self-pay | Admitting: Student

## 2022-03-22 ENCOUNTER — Other Ambulatory Visit: Payer: Self-pay

## 2022-05-02 ENCOUNTER — Ambulatory Visit (INDEPENDENT_AMBULATORY_CARE_PROVIDER_SITE_OTHER): Payer: Medicare Other

## 2022-05-02 ENCOUNTER — Other Ambulatory Visit: Payer: Self-pay

## 2022-05-02 VITALS — BP 128/72 | HR 84 | Temp 98.4°F | Ht 63.0 in | Wt 155.2 lb

## 2022-05-02 DIAGNOSIS — E1140 Type 2 diabetes mellitus with diabetic neuropathy, unspecified: Secondary | ICD-10-CM | POA: Diagnosis not present

## 2022-05-02 DIAGNOSIS — E11618 Type 2 diabetes mellitus with other diabetic arthropathy: Secondary | ICD-10-CM

## 2022-05-02 DIAGNOSIS — I129 Hypertensive chronic kidney disease with stage 1 through stage 4 chronic kidney disease, or unspecified chronic kidney disease: Secondary | ICD-10-CM | POA: Diagnosis not present

## 2022-05-02 DIAGNOSIS — R011 Cardiac murmur, unspecified: Secondary | ICD-10-CM

## 2022-05-02 DIAGNOSIS — Z0000 Encounter for general adult medical examination without abnormal findings: Secondary | ICD-10-CM | POA: Diagnosis not present

## 2022-05-02 DIAGNOSIS — Z7984 Long term (current) use of oral hypoglycemic drugs: Secondary | ICD-10-CM

## 2022-05-02 DIAGNOSIS — I10 Essential (primary) hypertension: Secondary | ICD-10-CM | POA: Diagnosis not present

## 2022-05-02 DIAGNOSIS — L282 Other prurigo: Secondary | ICD-10-CM | POA: Diagnosis not present

## 2022-05-02 DIAGNOSIS — N1832 Chronic kidney disease, stage 3b: Secondary | ICD-10-CM | POA: Diagnosis not present

## 2022-05-02 DIAGNOSIS — E1122 Type 2 diabetes mellitus with diabetic chronic kidney disease: Secondary | ICD-10-CM

## 2022-05-02 DIAGNOSIS — M1180 Other specified crystal arthropathies, unspecified site: Secondary | ICD-10-CM

## 2022-05-02 LAB — POCT GLYCOSYLATED HEMOGLOBIN (HGB A1C): Hemoglobin A1C: 6.1 % — AB (ref 4.0–5.6)

## 2022-05-02 LAB — GLUCOSE, CAPILLARY: Glucose-Capillary: 109 mg/dL — ABNORMAL HIGH (ref 70–99)

## 2022-05-02 MED ORDER — TRIAMCINOLONE ACETONIDE 0.025 % EX OINT: 1.0000 | TOPICAL_OINTMENT | Freq: Two times a day (BID) | CUTANEOUS | 0 refills | Status: DC

## 2022-05-02 MED ORDER — LOSARTAN POTASSIUM-HCTZ 100-25 MG PO TABS: 1.0000 | ORAL_TABLET | Freq: Every day | ORAL | 1 refills | Status: DC

## 2022-05-02 MED ORDER — GABAPENTIN 400 MG PO CAPS: 400.0000 mg | ORAL_CAPSULE | Freq: Two times a day (BID) | ORAL | 1 refills | Status: DC

## 2022-05-02 NOTE — Assessment & Plan Note (Addendum)
Hyperpigmented rash in medial knees and legs. Seborrheic keratoses on back.  Has been using hydrocortisone cream on leg rashes with significant relief.  Exam reveals patchy hyperpigmented spots on medial knee and leg.  Appears similar to nummular eczema.  Given improvement with hydrocortisone cream, this would be the presumed diagnosis as opposed to ringworm infection.  She is still having some itching, will exchange hydrocortisone cream for triamcinolone cream.  Suggested continued use of hypoallergenic lotions to assist with itching as well.

## 2022-05-02 NOTE — Assessment & Plan Note (Signed)
Has eye appointment, dental appointment set up already.  Discussed shingles vaccine and she can get this at pharmacy.  Had foot exam already done during home visit without any issues.

## 2022-05-02 NOTE — Patient Instructions (Addendum)
Wanda Mclaughlin, it was a pleasure seeing you today! You endorsed feeling well today. Below are some of the things we talked about this visit. We look forward to seeing you in the follow up appointment!  Today we discussed: Blood pressure: Your blood pressure looks great on your current meds Diabetes: your blood sugar and A1c look great, we can talk about stopping metformin and blood sugar checks at your next visit  Rash: apply the triamcinolone to the areas of the rash like you have been doing with the hydrocortisone cream. You can apply a hypoallergenic lotion to the areas as well  Knee pain: you can take 2 extra strength tylenol every 6 hours up to 3 times per day (morning, afternoon and night). Call us before your next visit if you want an earlier appointment for knee injections.  I will be checking your kidney function today and call you with results.  I have ordered the following labs today:  Lab Orders         BMP8+Anion Gap         POC Hbg A1C       Referrals ordered today:   Referral Orders  No referral(s) requested today     I have ordered the following medication/changed the following medications:   Stop the following medications: Medications Discontinued During This Encounter  Medication Reason   gabapentin (NEURONTIN) 400 MG capsule Reorder   losartan-hydrochlorothiazide (HYZAAR) 100-25 MG tablet Reorder     Start the following medications: Meds ordered this encounter  Medications   triamcinolone (KENALOG) 0.025 % ointment    Sig: Apply 1 Application topically 2 (two) times daily.    Dispense:  30 g    Refill:  0   gabapentin (NEURONTIN) 400 MG capsule    Sig: Take 1 capsule (400 mg total) by mouth 2 (two) times daily.    Dispense:  180 capsule    Refill:  1   losartan-hydrochlorothiazide (HYZAAR) 100-25 MG tablet    Sig: Take 1 tablet by mouth daily.    Dispense:  90 tablet    Refill:  1     Follow-up: 3 months   Please make sure to arrive 15  minutes prior to your next appointment. If you arrive late, you may be asked to reschedule.   We look forward to seeing you next time. Please call our clinic at [phone] if you have any questions or concerns. The best time to call is Monday-Friday from 9am-4pm, but there is someone available 24/7. If after hours or the weekend, call the main hospital number and ask for the Internal Medicine Resident On-Call. If you need medication refills, please notify your pharmacy one week in advance and they will send [place] a request.  Thank you for letting us take part in your care. Wishing you the best!  Thank you, Linus Galas MD

## 2022-05-02 NOTE — Assessment & Plan Note (Signed)
Will check BMP today. 

## 2022-05-02 NOTE — Progress Notes (Signed)
CC: Routine visit  HPI:  Wanda Mclaughlin is a 89 y.o.-year-old female with past medical history as below presenting for routine follow-up visit.  Please see encounters tab for problem-based charting.  Past Medical History:  Diagnosis Date   Anemia    Normal EGG, colonoscopy, and Sm. Bowel F/T 2006   Bereavement 08/18/2020   Bilateral hand pain 07/09/2016   Chronic knee pain    Chronic renal failure    Degenerative joint disease    Diabetes mellitus 11/05   type II   Diabetic peripheral neuropathy ([place])    Duodenitis    Gastritis    Health care maintenance 03/15/2013   Health maintenance examination    Mammogram 10/11: Possible mass, right breast. No evidence of malignancy in left breast. Needle biopsy of left breast 12/11: No evidence of  carcinoma. Patient needs follow-up right breast diagnostic mammogram with ultrasound in 6 months.   History of diabetic gastroparesis 11/28/2017   Hyperlipidemia    Hypertension    Left knee pain 10/06/2017   Osteoporosis 09/2009   T score -2.8   Psychophysiological insomnia 11/15/2016   Vertigo    Review of Systems: As in HPI.  Please see encounters tab for problem based charting.  Physical Exam:  Vitals:   05/02/22 1009  BP: 128/72  Pulse: 84  Temp: 98.4 F (36.9 C)  TempSrc: Oral  SpO2: 100%  Weight: 155 lb 3.2 oz (70.4 kg)  Height: 5\' 3"  (1.6 m)   General:Well-appearing, pleasant, In NAD Cardiac: RRR, 3/6 systolic murmur right upper sternal border Respiratory: Normal work of breathing on room air, CTAB Abdominal: Soft, nontender, nondistended Skin: Circular patches of hyperpigmented along the right medial knee and left lower extremity.  Some induration of elbows as well. Neuro: Passed mini cog with good clock face, remember 3/3 objects after 10 minutes  Assessment & Plan:   Essential hypertension BP well controlled on current regimen of amlodipine 10, hyzaar 100-25.  No new symptoms, refills sent no changes in  regimen.  Pruritic rash Hyperpigmented rash in medial knees and legs. Seborrheic keratoses on back.  Has been using hydrocortisone cream on leg rashes with significant relief.  Exam reveals patchy hyperpigmented spots on medial knee and leg.  Appears similar to nummular eczema.  Given improvement with hydrocortisone cream, this would be the presumed diagnosis as opposed to ringworm infection.  She is still having some itching, will exchange hydrocortisone cream for triamcinolone cream.  Suggested continued use of hypoallergenic lotions to assist with itching as well.  Diabetes mellitus with neuropathy ([facility]) On metformin 500, A1c today stable at 6.1, records from glucometer with great with no readings outside of range.  Can likely transition off of meds at next visit and stop taking blood sugar levels.  Calcium pyrophosphate crystal arthritis + osteoarthritis Bilateral arthritis, worse on the left compared to the right.  Has previously started Voltaren with some relief, occasionally takes Tylenol.  She still able to move around with the assistance of her cane but the pain is bothersome.  Exam reveals full range of motion and no neurological deficits.  Had a long conversation about options for treatment including further conservative management, steroid injections, knee replacements.  She declines any form of surgery and I agree it would be high risk.  She wants to think about steroid injections for now and continue conservative management.  Discussed pain management including Tylenol up to 1 g every 6 hours as needed for pain.  Can continue using Voltaren  gel, practicing knee exercises, and using warm compresses as well.  Can consider steroid injection at next visit if she is amenable.  CKD (chronic kidney disease) stage 3, GFR 30-59 ml/min Will check BMP today.  Healthcare maintenance Has eye appointment, dental appointment set up already.  Discussed shingles vaccine and she can get this at pharmacy.   Had foot exam already done during home visit without any issues.  Murmur 2-4/4 systolic murmur still heard at right upper sternal border.  No symptoms.  Last echo was in 2015.  Did not see much benefit in reobtaining echo given lack of symptoms at her age.   Patient discussed with Dr. Philipp Ovens

## 2022-05-02 NOTE — Assessment & Plan Note (Signed)
5-0/0 systolic murmur still heard at right upper sternal border.  No symptoms.  Last echo was in 2015.  Did not see much benefit in reobtaining echo given lack of symptoms at her age.

## 2022-05-02 NOTE — Assessment & Plan Note (Addendum)
BP well controlled on current regimen of amlodipine 10, hyzaar 100-25.  No new symptoms, refills sent no changes in regimen.

## 2022-05-02 NOTE — Progress Notes (Signed)
Subjective:   Wanda Mclaughlin is a 89 y.o. female who presents for an Initial Medicare Annual Wellness Visit. I connected with  Wanda Mclaughlin on 05/02/22 by a  Face-To-Face  enabled telemedicine application and verified that I am speaking with the correct person using two identifiers.  Patient Location: Other:  Office/Clinic  Provider Location: Office/Clinic  I discussed the limitations of evaluation and management by telemedicine. The patient expressed understanding and agreed to proceed.  Review of Systems    Defer to PCP       Objective:    Today's Vitals   05/02/22 1150  PainSc: 8    There is no height or weight on file to calculate BMI.     05/02/2022   11:53 AM 05/02/2022   10:02 AM 01/28/2022    8:43 AM 10/16/2021   11:05 AM 08/14/2021    9:33 AM 02/07/2021   12:05 PM 12/11/2020   10:34 AM  Advanced Directives  Does Patient Have a Medical Advance Directive? _0  No No  Would patient like information on creating a medical advance directive? No - Patient declined No - Patient declined No - Patient declined No - Patient declined No - Patient declined No - Patient declined No - Patient declined    Current Medications (verified) Outpatient Encounter Medications as of 05/02/2022  Medication Sig   acetaminophen (TYLENOL) 325 MG tablet Take 650 mg by mouth every 6 (six) hours as needed for mild pain.   amLODipine (NORVASC) 10 MG tablet Take 1 tablet (10 mg total) by mouth daily.   blood glucose meter kit and supplies KIT Dispense based on patient and insurance preference. Use up to four times daily as directed.   cyclobenzaprine (FLEXERIL) 10 MG tablet TAKE 1 TABLET (10 MG TOTAL) BY MOUTH EVERY OTHER DAY. AS NEEDED FOR MUSCLE SPASM   diclofenac Sodium (VOLTAREN) 1 % GEL Apply 2 g topically 4 (four) times daily.   gabapentin (NEURONTIN) 400 MG capsule Take 1 capsule (400 mg total) by mouth 2 (two) times daily.   glucose blood (ACCU-CHEK GUIDE) test strip USE UP TO 4 TIMES  A DAY AS DIRECTED   Hydrocortisone, Perianal, 1 % CREA APPLY 1 APPLICATION TOPICALLY 3 (THREE) TIMES DAILY. APPLY A THIN LAYER OF CREAM TO AREAS WITH RASH.   losartan-hydrochlorothiazide (HYZAAR) 100-25 MG tablet Take 1 tablet by mouth daily.   Melatonin 5 MG CAPS Take 1 capsule (5 mg total) by mouth at bedtime.   metFORMIN (GLUCOPHAGE) 500 MG tablet Take 1 tablet (500 mg total) by mouth daily with breakfast.   omeprazole (PRILOSEC) 20 MG capsule TAKE 1 CAPSULE BY MOUTH EVERY DAY AS NEEDED   senna (SENOKOT) 8.6 MG tablet Take 2-4 tablets by mouth at bedtime as needed for constipation.   No facility-administered encounter medications on file as of 05/02/2022.    Allergies (verified) Lisinopril   History: Past Medical History:  Diagnosis Date   Anemia    Normal EGG, colonoscopy, and Sm. Bowel F/T 2006   Bereavement 08/18/2020   Bilateral hand pain 07/09/2016   Chronic knee pain    Chronic renal failure    Degenerative joint disease    Diabetes mellitus 11/05   type II   Diabetic peripheral neuropathy ([place])    Duodenitis    Gastritis    Health care maintenance 03/15/2013   Health maintenance examination    Mammogram 10/11: Possible mass, right breast. No evidence of malignancy in left breast. Needle biopsy  of left breast 12/11: No evidence of  carcinoma. Patient needs follow-up right breast diagnostic mammogram with ultrasound in 6 months.   History of diabetic gastroparesis 11/28/2017   Hyperlipidemia    Hypertension    Left knee pain 10/06/2017   Osteoporosis 09/2009   T score -2.8   Psychophysiological insomnia 11/15/2016   Vertigo    Past Surgical History:  Procedure Laterality Date    Right needle localized lumpectomy.   12/11   Path report:FOCAL ATYPICAL DUCTAL HYPERPLASIA IN A BACKGROUND OF STROMAL  FIBROSIS. No evidence of carcinoma.    ABDOMINAL HYSTERECTOMY     1980's (Fibroids)   COLONOSCOPY     8/11 by Dr. Ardis Hughs: Normal colon, Given your age, you will not need another  colonoscopy for colon cancer screening or polyp surveillance   Family History  Problem Relation Age of Onset   Diabetes Sister    Social History   Socioeconomic History   Marital status: Widowed    Spouse name: Not on file   Number of children: 5   Years of education: 10th grade   Highest education level: Not on file  Occupational History   Occupation: Airline pilot: RETIRED    Comment: now retired  Tobacco Use   Smoking status: Never   Smokeless tobacco: Never  Substance and Sexual Activity   Alcohol use: No    Alcohol/week: 0.0 standard drinks of alcohol   Drug use: No   Sexual activity: Not on file  Other Topics Concern   Not on file  Social History Narrative   Lives by herself.   Daughter lives near by and help her with groceries.   Given diabetes card 05/03/2010   Social Determinants of Health   Financial Resource Strain: Low Risk  (05/02/2022)   Overall Financial Resource Strain (CARDIA)    Difficulty of Paying Living Expenses: Not hard at all  Food Insecurity: No Food Insecurity (05/02/2022)   Hunger Vital Sign    Worried About Running Out of Food in the Last Year: Never true    Ran Out of Food in the Last Year: Never true  Transportation Needs: No Transportation Needs (05/02/2022)   PRAPARE - Hydrologist (Medical): No    Lack of Transportation (Non-Medical): No  Physical Activity: Inactive (05/02/2022)   Exercise Vital Sign    Days of Exercise per Week: 0 days    Minutes of Exercise per Session: 0 min  Stress: No Stress Concern Present (05/02/2022)   [place]    Feeling of Stress : Not at all  Social Connections: Socially Isolated (05/02/2022)   Social Connection and Isolation Panel [NHANES]    Frequency of Communication with Friends and Family: More than three times a week    Frequency of Social Gatherings with Friends and Family: More than three times a week     Attends Religious Services: Never    Marine scientist or Organizations: No    Attends Archivist Meetings: Never    Marital Status: Widowed    Tobacco Counseling Counseling given: Not Answered   Clinical Intake:  Pre-visit preparation completed: Yes  Pain : 0-10 Pain Score: 8  Pain Type: Chronic pain Pain Location: Knee     Nutritional Risks: None Diabetes: Yes  How often do you need to have someone help you when you read instructions, pamphlets, or other written materials from your doctor or pharmacy?: 1 -  Never What is the last grade level you completed in school?: 10th grade  Diabetic?Yes  Interpreter Needed?: No  Information entered by :: Corey Skains Jagdeep Ancheta,cma 05/02/22 11:51am   Activities of Daily Living    05/02/2022   11:51 AM 05/02/2022   10:03 AM  In your present state of health, do you have any difficulty performing the following activities:  Hearing? 1 1  Vision? 0 0  Difficulty concentrating or making decisions? 1 1  Comment  AT TIMES  Walking or climbing stairs? 1 1  Dressing or bathing? 0 0  Doing errands, shopping? 1 1    Patient Care Team: Angelique Blonder, DO as PCP - General Barbaraann Cao, OD as Consulting Physician (Optometry)  Indicate any recent Medical Services you may have received from other than [facility] providers in the past year (date may be approximate).     Assessment:   This is a routine wellness examination for [facility].  Hearing/Vision screen No results found.  Dietary issues and exercise activities discussed:     Goals Addressed   None   Depression Screen    05/02/2022   10:02 AM 01/28/2022   10:31 AM 10/16/2021   11:05 AM 08/14/2021    9:31 AM 03/07/2021   10:11 AM 02/07/2021   12:05 PM 12/11/2020   10:35 AM  PHQ 2/9 Scores  PHQ - 2 Score 1 0 0 2 0 0 0  PHQ- 9 Score _0 0  0    Fall Risk    05/02/2022   11:51 AM 05/02/2022   10:02 AM 01/28/2022    8:43 AM 10/16/2021   11:04 AM 08/14/2021    9:31 AM   Fall Risk   Falls in the past year? 0 0 0 1 1  Number falls in past yr: 0  0 0 1  Injury with Fall? 0  0 1 1  Risk for fall due to : No Fall Risks Impaired balance/gait No Fall Risks History of fall(s);Impaired balance/gait;Impaired mobility;Impaired vision Impaired balance/gait  Follow up Falls evaluation completed;Falls prevention discussed Falls evaluation completed Falls evaluation completed Falls evaluation completed Falls evaluation completed;Falls prevention discussed    FALL RISK PREVENTION PERTAINING TO THE HOME:  Any stairs in or around the home? Yes  If so, are there any without handrails? Yes  Home free of loose throw rugs in walkways, pet beds, electrical cords, etc? Yes  Adequate lighting in your home to reduce risk of falls? Yes   ASSISTIVE DEVICES UTILIZED TO PREVENT FALLS:  Life alert? No  Use of a cane, walker or w/c? Yes  Grab bars in the bathroom? No  Shower chair or bench in shower? No  Elevated toilet seat or a handicapped toilet? Yes   TIMED UP AND GO:  Was the test performed? Yes .  Length of time to ambulate 10 feet: 57mn.   Gait slow and steady with assistive device  Cognitive Function:        05/02/2022   11:51 AM  6CIT Screen  What Year? 4 points  What month? 3 points  Count back from 20 2 points  Months in reverse 4 points  Repeat phrase 4 points    Immunizations Immunization History  Administered Date(s) Administered   Fluad Quad(high Dose 65+) 02/07/2021, 01/28/2022   Influenza Split 02/06/2011   Influenza Whole 06/13/2009   Influenza,inj,Quad PF,6+ Mos 01/14/2014, 02/03/2015, 12/26/2015, 01/25/2020   Influenza-Unspecified 02/17/2018, 01/28/2019   PFIZER(Purple Top)SARS-COV-2 Vaccination 10/29/2019, 11/19/2019   Pneumococcal Conjugate-13  07/04/2014   Pneumococcal Polysaccharide-23 11/28/2008   Td 11/28/2008   Tdap 07/12/2019    TDAP status: Due, Education has been provided regarding the importance of this vaccine. Advised may  receive this vaccine at local pharmacy or Health Dept. Aware to provide a copy of the vaccination record if obtained from local pharmacy or Health Dept. Verbalized acceptance and understanding.  Flu Vaccine status: Up to date  Pneumococcal vaccine status: Up to date  Covid-19 vaccine status: Completed vaccines  Qualifies for Shingles Vaccine? No   Zostavax completed No   Shingrix Completed?: No.    Education has been provided regarding the importance of this vaccine. Patient has been advised to call insurance company to determine out of pocket expense if they have not yet received this vaccine. Advised may also receive vaccine at local pharmacy or Health Dept. Verbalized acceptance and understanding.  Screening Tests Health Maintenance  Topic Date Due   Zoster Vaccines- Shingrix (1 of 2) Never done   FOOT EXAM  12/07/2021   COVID-19 Vaccine (3 - 2023-24 season) 12/28/2021   OPHTHALMOLOGY EXAM  04/06/2022   HEMOGLOBIN A1C  10/31/2022   Medicare Annual Wellness (AWV)  05/03/2023   DTaP/Tdap/Td (3 - Td or Tdap) 07/11/2029   Pneumonia Vaccine 65+ Years old  Completed   INFLUENZA VACCINE  Completed   DEXA SCAN  Completed   HPV VACCINES  Aged Out   LIPID PANEL  Discontinued    Health Maintenance  Health Maintenance Due  Topic Date Due   Zoster Vaccines- Shingrix (1 of 2) Never done   FOOT EXAM  12/07/2021   COVID-19 Vaccine (3 - 2023-24 season) 12/28/2021   OPHTHALMOLOGY EXAM  04/06/2022        Lung Cancer Screening: (Low Dose CT Chest recommended if Age 72-80 years, 30 pack-year currently smoking OR have quit w/in 15years.) does not qualify.   Lung Cancer Screening Referral: N/A  Additional Screening:  Hepatitis C Screening: does not qualify; Completed N/A  Vision Screening: Recommended annual ophthalmology exams for early detection of glaucoma and other disorders of the eye. Is the patient up to date with their annual eye exam?  No  Who is the provider or what is the  name of the office in which the patient attends annual eye exams? Dr. Marica Otter If pt is not established with a provider, would they like to be referred to a provider to establish care? No .   Dental Screening: Recommended annual dental exams for proper oral hygiene  Community Resource Referral / Chronic Care Management: CRR required this visit?  No   CCM required this visit?  No      Plan:     I have personally reviewed and noted the following in the patient's chart:   Medical and social history Use of alcohol, tobacco or illicit drugs  Current medications and supplements including opioid prescriptions. Patient is not currently taking opioid prescriptions. Functional ability and status Nutritional status Physical activity Advanced directives List of other physicians Hospitalizations, surgeries, and ER visits in previous 12 months Vitals Screenings to include cognitive, depression, and falls Referrals and appointments  In addition, I have reviewed and discussed with patient certain preventive protocols, quality metrics, and best practice recommendations. A written personalized care plan for preventive services as well as general preventive health recommendations were provided to patient.     Kerin Perna, [facility]   05/02/2022   Nurse Notes: Face-To-Face Visit  Ms. Jerelene Redden , Thank you for taking time to  come for your Medicare Wellness Visit. I appreciate your ongoing commitment to your health goals. Please review the following plan we discussed and let me know if I can assist you in the future.   These are the goals we discussed:  Goals      Blood Pressure < 160/90        This is a list of the screening recommended for you and due dates:  Health Maintenance  Topic Date Due   Zoster (Shingles) Vaccine (1 of 2) Never done   Complete foot exam   12/07/2021   COVID-19 Vaccine (3 - 2023-24 season) 12/28/2021   Eye exam for diabetics  04/06/2022   Hemoglobin A1C  10/31/2022    Medicare Annual Wellness Visit  05/03/2023   DTaP/Tdap/Td vaccine (3 - Td or Tdap) 07/11/2029   Pneumonia Vaccine  Completed   Flu Shot  Completed   DEXA scan (bone density measurement)  Completed   HPV Vaccine  Aged Out   Lipid (cholesterol) test  Discontinued

## 2022-05-02 NOTE — Assessment & Plan Note (Signed)
Bilateral arthritis, worse on the left compared to the right.  Has previously started Voltaren with some relief, occasionally takes Tylenol.  She still able to move around with the assistance of her cane but the pain is bothersome.  Exam reveals full range of motion and no neurological deficits.  Had a long conversation about options for treatment including further conservative management, steroid injections, knee replacements.  She declines any form of surgery and I agree it would be high risk.  She wants to think about steroid injections for now and continue conservative management.  Discussed pain management including Tylenol up to 1 g every 6 hours as needed for pain.  Can continue using Voltaren gel, practicing knee exercises, and using warm compresses as well.  Can consider steroid injection at next visit if she is amenable.

## 2022-05-02 NOTE — Assessment & Plan Note (Addendum)
On metformin 500, A1c today stable at 6.1, records from glucometer with great with no readings outside of range.  Can likely transition off of meds at next visit and stop taking blood sugar levels.

## 2022-05-03 LAB — BMP8+ANION GAP
Anion Gap: 14 mmol/L (ref 10.0–18.0)
BUN/Creatinine Ratio: 15 (ref 12–28)
BUN: 22 mg/dL (ref 10–36)
CO2: 23 mmol/L (ref 20–29)
Calcium: 9.6 mg/dL (ref 8.7–10.3)
Chloride: 101 mmol/L (ref 96–106)
Creatinine, Ser: 1.43 mg/dL — ABNORMAL HIGH (ref 0.57–1.00)
Glucose: 106 mg/dL — ABNORMAL HIGH (ref 70–99)
Potassium: 4.1 mmol/L (ref 3.5–5.2)
Sodium: 138 mmol/L (ref 134–144)
eGFR: 35 mL/min/{1.73_m2} — ABNORMAL LOW (ref >59)

## 2022-05-06 NOTE — Progress Notes (Signed)
Internal Medicine Clinic Attending  Case discussed with Dr. Sridharan  At the time of the visit.  We reviewed the resident's history and exam and pertinent patient test results.  I agree with the assessment, diagnosis, and plan of care documented in the resident's note.  

## 2022-05-07 ENCOUNTER — Telehealth: Payer: Self-pay

## 2022-05-07 NOTE — Patient Outreach (Signed)
  Care Coordination   05/07/2022 Name: Wanda Mclaughlin MRN: [id number] DOB: 1928/10/19   Care Coordination Outreach Attempts:  An unsuccessful telephone outreach was attempted today to offer the patient information about available care coordination services as a benefit of their health plan.   Follow Up Plan:  Additional outreach attempts will be made to offer the patient care coordination information and services.   Encounter Outcome:  No Answer   Care Coordination Interventions:  No, not indicated    Jone Baseman, RN, MSN [place] [facility] Care Management Coordinator Direct Line [phone]

## 2022-05-15 NOTE — Progress Notes (Signed)
I reviewed the AWV findings with the provider who conducted the visit. I was present in the office suite and immediately available to provide assistance and direction throughout the time the service was provided.  

## 2022-05-21 DIAGNOSIS — H52223 Regular astigmatism, bilateral: Secondary | ICD-10-CM | POA: Diagnosis not present

## 2022-05-21 DIAGNOSIS — Z9849 Cataract extraction status, unspecified eye: Secondary | ICD-10-CM | POA: Diagnosis not present

## 2022-05-21 DIAGNOSIS — I10 Essential (primary) hypertension: Secondary | ICD-10-CM | POA: Diagnosis not present

## 2022-05-21 DIAGNOSIS — H524 Presbyopia: Secondary | ICD-10-CM | POA: Diagnosis not present

## 2022-05-21 DIAGNOSIS — E119 Type 2 diabetes mellitus without complications: Secondary | ICD-10-CM | POA: Diagnosis not present

## 2022-05-21 DIAGNOSIS — H35033 Hypertensive retinopathy, bilateral: Secondary | ICD-10-CM | POA: Diagnosis not present

## 2022-05-21 LAB — HM DIABETES EYE EXAM

## 2022-05-22 ENCOUNTER — Telehealth: Payer: Self-pay

## 2022-05-22 NOTE — Patient Outreach (Signed)
  Care Coordination   05/22/2022 Name: Wanda Mclaughlin MRN: [id number] DOB: [birth year]   Care Coordination Outreach Attempts:  A second unsuccessful outreach was attempted today to offer the patient with information about available care coordination services as a benefit of their health plan.     Follow Up Plan:  Additional outreach attempts will be made to offer the patient care coordination information and services.   Encounter Outcome:  No Answer   Care Coordination Interventions:  No, not indicated    Jone Baseman, RN, MSN [place] Management Care Management Coordinator Direct Line [phone]

## 2022-05-30 ENCOUNTER — Telehealth: Payer: Self-pay

## 2022-05-30 NOTE — Patient Outreach (Signed)
  Care Coordination   05/30/2022 Name: Wanda Mclaughlin MRN: [id number] DOB: 11-Jan-1929   Care Coordination Outreach Attempts:  A third unsuccessful outreach was attempted today to offer the patient with information about available care coordination services as a benefit of their health plan.   Follow Up Plan:  No further outreach attempts will be made at this time. We have been unable to contact the patient to offer or enroll patient in care coordination services  Encounter Outcome:  No Answer   Care Coordination Interventions:  No, not indicated    Jone Baseman, RN, MSN [place] Management Care Management Coordinator Direct Line [phone]

## 2022-06-11 ENCOUNTER — Other Ambulatory Visit: Payer: Self-pay

## 2022-06-11 DIAGNOSIS — I10 Essential (primary) hypertension: Secondary | ICD-10-CM

## 2022-06-11 DIAGNOSIS — M62830 Muscle spasm of back: Secondary | ICD-10-CM

## 2022-06-12 ENCOUNTER — Other Ambulatory Visit: Payer: Self-pay

## 2022-06-12 ENCOUNTER — Other Ambulatory Visit: Payer: Self-pay | Admitting: Student in an Organized Health Care Education/Training Program

## 2022-06-12 NOTE — Telephone Encounter (Signed)
Next appt scheduled 08/01/22 with Dr Raymondo Band.

## 2022-06-14 MED ORDER — AMLODIPINE BESYLATE 10 MG PO TABS: 10.0000 mg | ORAL_TABLET | Freq: Every day | ORAL | 1 refills | Status: DC

## 2022-06-14 MED ORDER — CYCLOBENZAPRINE HCL 10 MG PO TABS: 10.0000 mg | ORAL_TABLET | Freq: Every day | ORAL | 0 refills | Status: DC | PRN

## 2022-07-26 ENCOUNTER — Other Ambulatory Visit: Payer: Self-pay | Admitting: Internal Medicine

## 2022-07-26 DIAGNOSIS — E1140 Type 2 diabetes mellitus with diabetic neuropathy, unspecified: Secondary | ICD-10-CM

## 2022-07-30 NOTE — Telephone Encounter (Signed)
Next appt scheduled 4/4 with Dr Raymondo Band.

## 2022-08-01 ENCOUNTER — Other Ambulatory Visit: Payer: Self-pay | Admitting: Internal Medicine

## 2022-08-01 ENCOUNTER — Other Ambulatory Visit: Payer: Self-pay

## 2022-08-01 ENCOUNTER — Encounter: Payer: Self-pay | Admitting: Internal Medicine

## 2022-08-01 ENCOUNTER — Ambulatory Visit (INDEPENDENT_AMBULATORY_CARE_PROVIDER_SITE_OTHER): Payer: 59 | Admitting: Internal Medicine

## 2022-08-01 VITALS — BP 100/87 | HR 89 | Temp 98.4°F | Ht 64.0 in | Wt 161.4 lb

## 2022-08-01 DIAGNOSIS — I10 Essential (primary) hypertension: Secondary | ICD-10-CM | POA: Diagnosis not present

## 2022-08-01 DIAGNOSIS — Z7984 Long term (current) use of oral hypoglycemic drugs: Secondary | ICD-10-CM

## 2022-08-01 DIAGNOSIS — E1140 Type 2 diabetes mellitus with diabetic neuropathy, unspecified: Secondary | ICD-10-CM | POA: Diagnosis not present

## 2022-08-01 DIAGNOSIS — M62830 Muscle spasm of back: Secondary | ICD-10-CM

## 2022-08-01 DIAGNOSIS — L282 Other prurigo: Secondary | ICD-10-CM

## 2022-08-01 LAB — POCT GLYCOSYLATED HEMOGLOBIN (HGB A1C): Hemoglobin A1C: 6.1 % — AB (ref 4.0–5.6)

## 2022-08-01 LAB — GLUCOSE, CAPILLARY: Glucose-Capillary: 105 mg/dL — ABNORMAL HIGH (ref 70–99)

## 2022-08-01 MED ORDER — BETAMETHASONE VALERATE 0.1 % EX OINT: 1.0000 | TOPICAL_OINTMENT | Freq: Two times a day (BID) | CUTANEOUS | 0 refills | Status: AC

## 2022-08-01 MED ORDER — BENADRYL ITCH STOPPING 1-0.1 % EX CREA: TOPICAL_CREAM | Freq: Three times a day (TID) | CUTANEOUS | 0 refills | Status: AC | PRN

## 2022-08-01 NOTE — Progress Notes (Signed)
CC: 3 month follow up  HPI:  [name] is a 89 y.o. female living with a history stated below and presents today for a 3 month follow up of her chronic medical conditions. Please see problem based assessment and plan for additional details.  Past Medical History:  Diagnosis Date   Anemia    Normal EGG, colonoscopy, and Sm. Bowel F/T 2006   Bereavement 08/18/2020   Bilateral hand pain 07/09/2016   Chronic knee pain    Chronic renal failure    Degenerative joint disease    Diabetes mellitus 11/05   type II   Diabetic peripheral neuropathy    Duodenitis    Gastritis    Health care maintenance 03/15/2013   Health maintenance examination    Mammogram 10/11: Possible mass, right breast. No evidence of malignancy in left breast. Needle biopsy of left breast 12/11: No evidence of  carcinoma. Patient needs follow-up right breast diagnostic mammogram with ultrasound in 6 months.   History of diabetic gastroparesis 11/28/2017   Hyperlipidemia    Hypertension    Left knee pain 10/06/2017   Osteoporosis 09/2009   T score -2.8   Psychophysiological insomnia 11/15/2016   Vertigo     Current Outpatient Medications on File Prior to Visit  Medication Sig Dispense Refill   acetaminophen (TYLENOL) 325 MG tablet Take 650 mg by mouth every 6 (six) hours as needed for mild pain.     blood glucose meter kit and supplies KIT Dispense based on patient and insurance preference. Use up to four times daily as directed. 1 each 0   cyclobenzaprine (FLEXERIL) 10 MG tablet Take 1 tablet (10 mg total) by mouth daily as needed for muscle spasms. 30 tablet 0   diclofenac Sodium (VOLTAREN) 1 % GEL Apply 2 g topically 4 (four) times daily. 50 g 1   gabapentin (NEURONTIN) 400 MG capsule Take 1 capsule (400 mg total) by mouth 2 (two) times daily. 180 capsule 1   glucose blood (ACCU-CHEK GUIDE) test strip USE UP TO 4 TIMES A DAY AS DIRECTED 100 strip 2   losartan-hydrochlorothiazide (HYZAAR) 100-25 MG tablet  Take 1 tablet by mouth daily. 90 tablet 1   Melatonin 5 MG CAPS Take 1 capsule (5 mg total) by mouth at bedtime. 30 capsule 1   metFORMIN (GLUCOPHAGE) 500 MG tablet Take 1 tablet (500 mg total) by mouth daily with breakfast. 90 tablet 2   omeprazole (PRILOSEC) 20 MG capsule TAKE 1 CAPSULE BY MOUTH EVERY DAY AS NEEDED 90 capsule 3   senna (SENOKOT) 8.6 MG tablet Take 2-4 tablets by mouth at bedtime as needed for constipation. 30 tablet 5   triamcinolone (KENALOG) 0.025 % ointment APPLY TO AFFECTED AREA TWICE A DAY 30 g 0   No current facility-administered medications on file prior to visit.    Family History  Problem Relation Age of Onset   Diabetes Sister     Social History   Socioeconomic History   Marital status: Widowed    Spouse name: Not on file   Number of children: 5   Years of education: 10th grade   Highest education level: Not on file  Occupational History   Occupation: Airline pilot: RETIRED    Comment: now retired  Tobacco Use   Smoking status: Never   Smokeless tobacco: Never  Substance and Sexual Activity   Alcohol use: No    Alcohol/week: 0.0 standard drinks of alcohol   Drug use: No  Sexual activity: Not on file  Other Topics Concern   Not on file  Social History Narrative   Lives by herself.   Daughter lives near by and help her with groceries.   Given diabetes card 05/03/2010   Social Determinants of Health   Financial Resource Strain: Low Risk  (05/02/2022)   Overall Financial Resource Strain (CARDIA)    Difficulty of Paying Living Expenses: Not hard at all  Food Insecurity: No Food Insecurity (05/02/2022)   Hunger Vital Sign    Worried About Running Out of Food in the Last Year: Never true    Ran Out of Food in the Last Year: Never true  Transportation Needs: No Transportation Needs (05/02/2022)   PRAPARE - Hydrologist (Medical): No    Lack of Transportation (Non-Medical): No  Physical Activity: Inactive (05/02/2022)    Exercise Vital Sign    Days of Exercise per Week: 0 days    Minutes of Exercise per Session: 0 min  Stress: No Stress Concern Present (05/02/2022)   [place]    Feeling of Stress : Not at all  Social Connections: Socially Isolated (05/02/2022)   Social Connection and Isolation Panel [NHANES]    Frequency of Communication with Friends and Family: More than three times a week    Frequency of Social Gatherings with Friends and Family: More than three times a week    Attends Religious Services: Never    Marine scientist or Organizations: No    Attends Archivist Meetings: Never    Marital Status: Widowed  Intimate Partner Violence: Not At Risk (05/02/2022)   Humiliation, Afraid, Rape, and Kick questionnaire    Fear of Current or Ex-Partner: No    Emotionally Abused: No    Physically Abused: No    Sexually Abused: No    Review of Systems: ROS negative except for what is noted on the assessment and plan.  Vitals:   08/01/22 0928  BP: 100/87  Pulse: 89  Temp: 98.4 F (36.9 C)  TempSrc: Oral  SpO2: 99%  Weight: 161 lb 6.4 oz (73.2 kg)  Height: 5\' 4"  (1.626 m)    Physical Exam: Constitutional: well-appearing elderly female sitting in chair, in no acute distress Cardiovascular: regular rate and rhythm, + systolic murmur Pulmonary/Chest: normal work of breathing on room air MSK: normal bulk and tone Neurological: alert & oriented x 3, no focal deficit Skin: warm and dry, areas of hyperpigmentation on bilateral legs/knees and arms Psych: normal mood and behavior  Assessment & Plan:    Patient discussed with Dr. Daryll Drown  Essential hypertension Blood pressure is 100/87.  She is on amlodipine 10, Hyzaar 100-25 daily.  She denies any headache, dizziness, lightheadedness, or chest pain.  Given the patient's advanced age, and borderline low blood pressure, will stop amlodipine at this time.  Plan: -Stop  amlodipine - Continue Hyzaar 100-25 mg daily -Follow-up in 3 months  Diabetes mellitus with neuropathy (HCC) A1c was 6.1% in January and is stable at 6.1% today.  The is on metformin 500 mg daily and checks her blood sugar every day with all of her blood sugars within the target range.  Plan: - Continue metformin 500 mg daily - Advised patient she can discontinue checking her blood sugar daily - Foot exam performed today  Pruritic rash The patient presents for follow-up of her hyperpigmented rash on her medial knees and legs.  She has been  using hydrocortisone cream, which was previously giving her significant relief, but it is not helping as much anymore.  She also states that the rash is now on her arms and is very pruritic.  On exam, she has hyperpigmented spots on her knees and legs.  She also has some areas of hyperpigmentation on her bilateral arms.  The rash was previously thought to be secondary to nummular eczema, but this does not seem to be the case on the arms.  Will trial a different steroid cream.  Plan: -Betamethasone ointment 2 times daily as needed (advised to not use in genital region or on face) -Benadryl cream as needed - Avoid using lotions/body washes with fragrance/advised to use hypoallergenic lotions   Heidie Krall, D.O. [facility], PGY-2 Phone: [phone] Date 08/01/2022 Time 10:35 AM

## 2022-08-01 NOTE — Assessment & Plan Note (Addendum)
The patient presents for follow-up of her hyperpigmented rash on her medial knees and legs.  She has been using hydrocortisone cream, which was previously giving her significant relief, but it is not helping as much anymore.  She also states that the rash is now on her arms and is very pruritic.  On exam, she has hyperpigmented spots on her knees and legs.  She also has some areas of hyperpigmentation on her bilateral arms.  The rash was previously thought to be secondary to nummular eczema, but this does not seem to be the case on the arms.  Will trial a different steroid cream.  Plan: -Betamethasone ointment 2 times daily as needed (advised to not use in genital region or on face) -Benadryl cream as needed - Avoid using lotions/body washes with fragrance/advised to use hypoallergenic lotions

## 2022-08-01 NOTE — Assessment & Plan Note (Addendum)
A1c was 6.1% in January and is stable at 6.1% today.  The is on metformin 500 mg daily and checks her blood sugar every day with all of her blood sugars within the target range.  Plan: - Continue metformin 500 mg daily - Advised patient she can discontinue checking her blood sugar daily - Foot exam performed today

## 2022-08-01 NOTE — Assessment & Plan Note (Signed)
Blood pressure is 100/87.  She is on amlodipine 10, Hyzaar 100-25 daily.  She denies any headache, dizziness, lightheadedness, or chest pain.  Given the patient's advanced age, and borderline low blood pressure, will stop amlodipine at this time.  Plan: -Stop amlodipine - Continue Hyzaar 100-25 mg daily -Follow-up in 3 months

## 2022-08-01 NOTE — Patient Instructions (Signed)
Thank you, Ms.[name] for allowing [place] to provide your care today. Today we discussed:  High blood pressure: STOP taking amlodipine Keep taking losartan-hctz (hyzaar) once a day Diabetes Keep taking metformin once a day You don't need to check your blood sugars anymore! Your diabetes is under great control Rash: Stop using hydrocortisone cream Use benadryl cream + betamethasone cream as needed - do not apply to genital region or face  I have ordered the following labs for you:   Lab Orders         Glucose, capillary         POC Hbg A1C       Referrals ordered today:   Referral Orders  No referral(s) requested today     I have ordered the following medication/changed the following medications:   Stop the following medications: Medications Discontinued During This Encounter  Medication Reason   Hydrocortisone, Perianal, 1 % CREA      Start the following medications: Meds ordered this encounter  Medications   diphenhydrAMINE-zinc acetate (BENADRYL ITCH STOPPING) cream    Sig: Apply topically 3 (three) times daily as needed for itching.    Dispense:  28.3 g    Refill:  0   betamethasone valerate ointment (VALISONE) 0.1 %    Sig: Apply 1 Application topically 2 (two) times daily.    Dispense:  30 g    Refill:  0     Follow up: 3 months     Should you have any questions or concerns please call the internal medicine clinic at [phone].     Buddy Duty, D.O. [place]

## 2022-08-05 NOTE — Progress Notes (Signed)
Internal Medicine Clinic Attending ° °Case discussed with Dr. Atway  at the time of the visit.  We reviewed the resident’s history and exam and pertinent patient test results.  I agree with the assessment, diagnosis, and plan of care documented in the resident’s note.  °

## 2022-09-07 ENCOUNTER — Other Ambulatory Visit: Payer: Self-pay | Admitting: Student

## 2022-09-10 ENCOUNTER — Other Ambulatory Visit: Payer: Self-pay

## 2022-09-10 DIAGNOSIS — M62830 Muscle spasm of back: Secondary | ICD-10-CM

## 2022-09-10 MED ORDER — CYCLOBENZAPRINE HCL 10 MG PO TABS: ORAL_TABLET | ORAL | 0 refills | Status: DC

## 2022-09-16 ENCOUNTER — Encounter: Payer: Self-pay | Admitting: Dietician

## 2022-09-30 ENCOUNTER — Other Ambulatory Visit: Payer: Self-pay | Admitting: Student

## 2022-10-06 ENCOUNTER — Other Ambulatory Visit: Payer: Self-pay | Admitting: Student

## 2022-10-07 NOTE — Telephone Encounter (Signed)
Refill Request  Hydrocortisone, Perianal, 1 % CREA [[id number]  [id number]]    [id number] - [place], [place] - [address]   Pt states diphenhydrAMINE-zinc acetate (BENADRYL ITCH STOPPING) cream  medication does not help

## 2022-10-15 ENCOUNTER — Other Ambulatory Visit: Payer: Self-pay | Admitting: Student

## 2022-10-15 DIAGNOSIS — M62830 Muscle spasm of back: Secondary | ICD-10-CM

## 2022-10-15 NOTE — Telephone Encounter (Signed)
cyclobenzaprine (FLEXERIL) 10 MG tablet    [id number] [place], [place] - [address] (Ph: [phone])

## 2022-10-16 MED ORDER — CYCLOBENZAPRINE HCL 10 MG PO TABS: ORAL_TABLET | ORAL | 0 refills | Status: DC

## 2022-11-05 ENCOUNTER — Other Ambulatory Visit: Payer: Self-pay

## 2022-11-05 DIAGNOSIS — E1140 Type 2 diabetes mellitus with diabetic neuropathy, unspecified: Secondary | ICD-10-CM

## 2022-11-05 MED ORDER — GABAPENTIN 400 MG PO CAPS
400.0000 mg | ORAL_CAPSULE | Freq: Two times a day (BID) | ORAL | 1 refills | Status: DC
Start: 2022-11-05 — End: 2023-05-06

## 2022-11-18 ENCOUNTER — Other Ambulatory Visit: Payer: Self-pay

## 2022-11-18 DIAGNOSIS — M62830 Muscle spasm of back: Secondary | ICD-10-CM

## 2022-11-18 MED ORDER — CYCLOBENZAPRINE HCL 10 MG PO TABS
ORAL_TABLET | ORAL | 0 refills | Status: DC
Start: 2022-11-18 — End: 2022-12-16

## 2022-12-02 ENCOUNTER — Ambulatory Visit (INDEPENDENT_AMBULATORY_CARE_PROVIDER_SITE_OTHER): Payer: 59 | Admitting: Student

## 2022-12-02 ENCOUNTER — Encounter: Payer: Self-pay | Admitting: Student

## 2022-12-02 VITALS — BP 196/87 | HR 87 | Wt 161.4 lb

## 2022-12-02 DIAGNOSIS — R109 Unspecified abdominal pain: Secondary | ICD-10-CM | POA: Diagnosis not present

## 2022-12-02 DIAGNOSIS — N390 Urinary tract infection, site not specified: Secondary | ICD-10-CM | POA: Diagnosis not present

## 2022-12-02 DIAGNOSIS — E1140 Type 2 diabetes mellitus with diabetic neuropathy, unspecified: Secondary | ICD-10-CM

## 2022-12-02 DIAGNOSIS — I10 Essential (primary) hypertension: Secondary | ICD-10-CM

## 2022-12-02 DIAGNOSIS — Z7984 Long term (current) use of oral hypoglycemic drugs: Secondary | ICD-10-CM

## 2022-12-02 LAB — POCT GLYCOSYLATED HEMOGLOBIN (HGB A1C): Hemoglobin A1C: 6.6 % — AB (ref 4.0–5.6)

## 2022-12-02 LAB — GLUCOSE, CAPILLARY: Glucose-Capillary: 93 mg/dL (ref 70–99)

## 2022-12-02 MED ORDER — AMLODIPINE BESYLATE 5 MG PO TABS
5.0000 mg | ORAL_TABLET | Freq: Every day | ORAL | 11 refills | Status: DC
Start: 2022-12-02 — End: 2022-12-02

## 2022-12-02 MED ORDER — AMLODIPINE BESYLATE 10 MG PO TABS
10.0000 mg | ORAL_TABLET | Freq: Every day | ORAL | 11 refills | Status: DC
Start: 2022-12-02 — End: 2023-11-17

## 2022-12-02 NOTE — Progress Notes (Signed)
A1c increased from last time at 6.6 (6.1 previously). Pt to continue taking Metformin 500mg  daily.

## 2022-12-02 NOTE — Assessment & Plan Note (Addendum)
SBP today is in the 190s x3. She has been having more blurriness of vision this week but is otherwise neurologically intact. This could also be contributing to her weakness, she states she was cooking and dropped the pan of wings x2. She is also more fatigued this week. It could be that these symptoms could be caused by an underlying UTI since she had flank pain on the right. Her amlodipine was cut back last time, will resume today as this seems to be more of a chronic elevation in BP than an acute episode.  -Patient was educated on the signs of a stroke and advised to head to the ED should she immediately develop these.  -Re-start amlodopine 10mg  daily  -Check CBC and BMP  -May consider a CT head if UA is not concerning for a UTI. -RTC in 2 weeks

## 2022-12-02 NOTE — Patient Instructions (Addendum)
Thank you, Ms.[name] for allowing [place] to provide your care today. Today we discussed your hypertension and concerns for a potential urinary tract infection  I have ordered the following labs for you:   Lab Orders         Glucose, capillary         Urinalysis, Reflex Microscopic         CBC with Diff         BMP8+Anion Gap         POC Hbg A1C      Tests ordered today:  I will call you with the results of your tests and let you know if you will need medications for that.   I have ordered the following medication/changed the following medications:   Stop the following medications: Medications Discontinued During This Encounter  Medication Reason   amLODipine (NORVASC) 5 MG tablet      Start the following medications: Meds ordered this encounter  Medications   DISCONTD: amLODipine (NORVASC) 5 MG tablet    Sig: Take 1 tablet (5 mg total) by mouth daily.    Dispense:  30 tablet    Refill:  11   amLODipine (NORVASC) 10 MG tablet    Sig: Take 1 tablet (10 mg total) by mouth daily.    Dispense:  30 tablet    Refill:  11     Follow up:  2 weeks     Remember: To restart Amlodipine (blood pressure medication). Watch for signs of increased weakness, confusion, difficulty speaking, facial droop. In those situations please head to the ED immediately.   Should you have any questions or concerns please call the internal medicine clinic at [phone].     Manuela Neptune, MD  [facility]

## 2022-12-02 NOTE — Assessment & Plan Note (Signed)
Pt reports increased fatigue, weakness, blurriness of vision, and is tachycardic on exam today. She reports flank pain on the right, but no hematuria, dysuria, polyuria or abdominal pain. She does have CVA tenderness on exam. Will check UA for a UTI. CBC with Diff, and BMP also ordered.

## 2022-12-02 NOTE — Assessment & Plan Note (Addendum)
Pt is coming for a FU on DM. Her A1C is more elevated than last time at 6.6 but overall stable and well controlled. She is not due for a foot exam or eye exam until next year. Pt has CKD stage 3 and will resume amlodipine 10mg  Once daily today.  -Continue taking 500mg  Meformin daily  -continue taking Gabapentin 400mg  BID  -restart amlodipine 10mg  daily

## 2022-12-02 NOTE — Progress Notes (Signed)
Established Patient Office Visit  Subjective   Patient ID: Wanda Mclaughlin, female    DOB: 03-30-1929  Age: 89 y.o. MRN: [id number]  Chief Complaint  Patient presents with   Hand Pain    Chronic  "getting worse" pain 9/10 numbness    Knee Pain    Chronic bilateral    Diabetes   Rash    Dry itchy skin no legs    Hypertension    HPI  Wanda Mclaughlin is a 89 y.o.  presenting today for DM management. She is also concerned about her BP being so high. The rest of the HPI is in the A/P of this note.   Past Medical History:  Diagnosis Date   Anemia    Normal EGG, colonoscopy, and Sm. Bowel F/T 2006   Bereavement 08/18/2020   Bilateral hand pain 07/09/2016   Chronic knee pain    Chronic renal failure    Degenerative joint disease    Diabetes mellitus 11/05   type II   Diabetic peripheral neuropathy (HCC)    Duodenitis    Gastritis    Health care maintenance 03/15/2013   Health maintenance examination    Mammogram 10/11: Possible mass, right breast. No evidence of malignancy in left breast. Needle biopsy of left breast 12/11: No evidence of  carcinoma. Patient needs follow-up right breast diagnostic mammogram with ultrasound in 6 months.   History of diabetic gastroparesis 11/28/2017   Hyperlipidemia    Hypertension    Left knee pain 10/06/2017   Osteoporosis 09/2009   T score -2.8   Psychophysiological insomnia 11/15/2016   Vertigo    Past Surgical History:  Procedure Laterality Date    Right needle localized lumpectomy.   12/11   Path report:FOCAL ATYPICAL DUCTAL HYPERPLASIA IN A BACKGROUND OF STROMAL  FIBROSIS. No evidence of carcinoma.    ABDOMINAL HYSTERECTOMY     1980's (Fibroids)   COLONOSCOPY     8/11 by Dr. Christella Hartigan: Normal colon, Given your age, you will not need another colonoscopy for colon cancer screening or polyp surveillance   Social History   Tobacco Use   Smoking status: Never   Smokeless tobacco: Never  Substance Use Topics   Alcohol use: No     Alcohol/week: 0.0 standard drinks of alcohol   Drug use: No   Family History  Problem Relation Age of Onset   Diabetes Sister    Allergies  Allergen Reactions   Lisinopril     REACTION: Possi ble ANGIOEDEMA      Review of Systems  Constitutional:  Positive for chills and malaise/fatigue. Negative for fever.  Eyes:  Positive for blurred vision.  Respiratory:  Negative for cough.   Cardiovascular:  Negative for chest pain.  Gastrointestinal:  Positive for abdominal pain and constipation. Negative for nausea and vomiting.  Genitourinary:  Negative for dysuria, frequency, hematuria and urgency.  Neurological:  Positive for tremors, weakness and headaches.  Endo/Heme/Allergies:  Negative for polydipsia.      Objective:     BP (!) 196/87 (BP Location: Right Arm, Patient Position: Sitting, Cuff Size: Normal)   Pulse 87   Wt 161 lb 6.4 oz (73.2 kg)   SpO2 100%   BMI 27.70 kg/m  BP Readings from Last 3 Encounters:  12/02/22 (!) 196/87  08/01/22 100/87  05/02/22 128/72      Physical Exam Constitutional:      General: She is not in acute distress. HENT:     Nose: No congestion.  Mouth/Throat:     Mouth: Mucous membranes are dry.  Eyes:     Extraocular Movements: Extraocular movements intact.     Pupils: Pupils are equal, round, and reactive to light.  Cardiovascular:     Rate and Rhythm: Regular rhythm. Tachycardia present.     Heart sounds: No murmur heard.    No friction rub. No gallop.  Pulmonary:     Effort: Pulmonary effort is normal. No respiratory distress.  Abdominal:     General: There is no distension.     Palpations: Abdomen is soft.     Tenderness: There is no abdominal tenderness. There is right CVA tenderness. There is no left CVA tenderness.  Musculoskeletal:     Right lower leg: No edema.     Left lower leg: No edema.  Skin:    General: Skin is warm and dry.  Neurological:     Mental Status: She is alert.     Results for orders placed or  performed in visit on 12/02/22  Glucose, capillary  Result Value Ref Range   Glucose-Capillary 93 70 - 99 mg/dL  POC Hbg Z6X  Result Value Ref Range   Hemoglobin A1C 6.6 (A) 4.0 - 5.6 %   HbA1c POC (<> result, manual entry)     HbA1c, POC (prediabetic range)     HbA1c, POC (controlled diabetic range)      Last CBC Lab Results  Component Value Date   WBC 5.7 02/07/2021   HGB 11.7 02/07/2021   HCT 33.6 (L) 02/07/2021   MCV 97 02/07/2021   MCH 33.8 (H) 02/07/2021   RDW 12.9 02/07/2021   PLT 251 02/07/2021   Last metabolic panel Lab Results  Component Value Date   GLUCOSE 106 (H) 05/02/2022   NA 138 05/02/2022   K 4.1 05/02/2022   CL 101 05/02/2022   CO2 23 05/02/2022   BUN 22 05/02/2022   CREATININE 1.43 (H) 05/02/2022   EGFR 35 (L) 05/02/2022   CALCIUM 9.6 05/02/2022   PHOS 3.6 09/21/2009   PROT 7.7 01/28/2022   ALBUMIN 4.5 01/28/2022   LABGLOB 3.2 01/28/2022   AGRATIO 1.4 01/28/2022   BILITOT 0.3 01/28/2022   ALKPHOS 70 01/28/2022   AST 12 01/28/2022   ALT 11 01/28/2022   ANIONGAP 12 01/28/2019   Last lipids Lab Results  Component Value Date   CHOL 137 03/16/2014   HDL 50 03/16/2014   LDLCALC 68 03/16/2014   TRIG 94 03/16/2014   CHOLHDL 2.7 03/16/2014   Last hemoglobin A1c Lab Results  Component Value Date   HGBA1C 6.6 (A) 12/02/2022    The ASCVD Risk score (Arnett DK, et al., 2019) failed to calculate for the following reasons:   The 2019 ASCVD risk score is only valid for ages 76 to 94    Assessment & Plan:   Problem List Items Addressed This Visit       Cardiovascular and Mediastinum   Essential hypertension (Chronic)    SBP today is in the 190s x3. She has been having more blurriness of vision this week but is otherwise neurologically intact. This could also be contributing to her weakness, she states she was cooking and dropped the pan of wings x2. She is also more fatigued this week. It could be that these symptoms could be caused by an  underlying UTI since she had flank pain on the right. Her amlodipine was cut back last time, will resume today as this seems to be more of a  chronic elevation in BP than an acute episode.  -Patient was educated on the signs of a stroke and advised to head to the ED should she immediately develop these.  -Re-start amlodopine 10mg  daily  -Check CBC and BMP  -May consider a CT head if UA is not concerning for a UTI. -RTC in 2 weeks      Relevant Medications   amLODipine (NORVASC) 10 MG tablet     Endocrine   Diabetes mellitus with neuropathy (HCC) - Primary (Chronic)    Pt is coming for a FU on DM. Her A1C is more elevated than last time at 6.6 but overall stable and well controlled. She is not due for a foot exam or eye exam until next year. Pt has CKD stage 3 and will resume amlodipine 10mg  Once daily today.  -Continue taking 500mg  Meformin daily  -continue taking Gabapentin 400mg  BID  -restart amlodipine 10mg  daily      Relevant Orders   POC Hbg A1C (Completed)     Other   Flank pain    Pt reports increased fatigue, weakness, blurriness of vision, and is tachycardic on exam today. She reports flank pain on the right, but no hematuria, dysuria, polyuria or abdominal pain. She does have CVA tenderness on exam. Will check UA for a UTI. CBC with Diff, and BMP also ordered.       Other Visit Diagnoses     Urinary tract infection without hematuria, site unspecified       Relevant Orders   Urinalysis, Reflex Microscopic   CBC with Diff   BMP8+Anion Gap       Return in about 2 weeks (around 12/16/2022).    Manuela Neptune, MD

## 2022-12-03 MED ORDER — AMOXICILLIN-POT CLAVULANATE 500-125 MG PO TABS
500.0000 mg | ORAL_TABLET | Freq: Two times a day (BID) | ORAL | 0 refills | Status: AC
Start: 2022-12-03 — End: 2022-12-10

## 2022-12-03 NOTE — Addendum Note (Signed)
Addended by: Manuela Neptune on: 12/03/2022 03:13 PM   Modules accepted: Orders

## 2022-12-03 NOTE — Progress Notes (Signed)
Internal Medicine Clinic Attending  I was physically present during the key portions of the resident provided service and participated in the medical decision making of patient's management care. I reviewed pertinent patient test results.  The assessment, diagnosis, and plan were formulated together and I agree with the documentation in the resident's note.  Given UA and non specific sxs we have opted to treat empirically for UTI. Unfortunately reflex to urine culture was not sent, we are not able to add this on at this time. We have opted for augmentin; will avoid bactrim with her CKD. If no improvement, return for repeat UA and Urine culture. Rest per resident note.   Reymundo Poll, MD

## 2022-12-03 NOTE — Progress Notes (Signed)
Spoke to daughter, aware that if symptoms do not resolve she will need to come back for a repeat UA. Augmentin 500mg  BID for 7 days sent.

## 2022-12-16 ENCOUNTER — Ambulatory Visit (INDEPENDENT_AMBULATORY_CARE_PROVIDER_SITE_OTHER): Payer: 59 | Admitting: Student

## 2022-12-16 ENCOUNTER — Encounter: Payer: Self-pay | Admitting: Student

## 2022-12-16 VITALS — BP 142/68 | HR 88 | Temp 98.3°F | Ht 64.0 in | Wt 161.6 lb

## 2022-12-16 DIAGNOSIS — M65352 Trigger finger, left little finger: Secondary | ICD-10-CM | POA: Insufficient documentation

## 2022-12-16 DIAGNOSIS — M1990 Unspecified osteoarthritis, unspecified site: Secondary | ICD-10-CM

## 2022-12-16 DIAGNOSIS — M1180 Other specified crystal arthropathies, unspecified site: Secondary | ICD-10-CM | POA: Diagnosis not present

## 2022-12-16 DIAGNOSIS — M62830 Muscle spasm of back: Secondary | ICD-10-CM

## 2022-12-16 DIAGNOSIS — K219 Gastro-esophageal reflux disease without esophagitis: Secondary | ICD-10-CM | POA: Diagnosis not present

## 2022-12-16 DIAGNOSIS — Z0000 Encounter for general adult medical examination without abnormal findings: Secondary | ICD-10-CM

## 2022-12-16 DIAGNOSIS — I10 Essential (primary) hypertension: Secondary | ICD-10-CM | POA: Diagnosis not present

## 2022-12-16 DIAGNOSIS — R109 Unspecified abdominal pain: Secondary | ICD-10-CM

## 2022-12-16 DIAGNOSIS — N390 Urinary tract infection, site not specified: Secondary | ICD-10-CM

## 2022-12-16 MED ORDER — OMEPRAZOLE 20 MG PO CPDR
20.0000 mg | DELAYED_RELEASE_CAPSULE | Freq: Every day | ORAL | 3 refills | Status: DC
Start: 2022-12-16 — End: 2023-11-12

## 2022-12-16 MED ORDER — CYCLOBENZAPRINE HCL 10 MG PO TABS
ORAL_TABLET | ORAL | 0 refills | Status: DC
Start: 2022-12-16 — End: 2023-02-20

## 2022-12-16 MED ORDER — DICLOFENAC SODIUM 1 % EX GEL
2.0000 g | Freq: Four times a day (QID) | CUTANEOUS | 1 refills | Status: DC
Start: 2022-12-16 — End: 2023-08-13

## 2022-12-16 NOTE — Assessment & Plan Note (Addendum)
Wanda Mclaughlin was in the 190s on 8/5. She was re-started on amlodipine 10mg  daily and BP is still somewhat elevated. She reports that Bps have been in the 130s at  home. She will measure them and bring them back at next visit. Denies symptoms of hypotension -cw amlodipine 10mg  daily  -cw Hyzaar 100-25 daily -reassess at next visit

## 2022-12-16 NOTE — Assessment & Plan Note (Addendum)
Was seen on 8/5 with increased fatigue, weakness, blurriness of vision, and  tachycardia. She had flank pain on the right, but no hematuria, dysuria, polyuria or abdominal pain. She does have some CVA tenderness on exam but patient is not as ill-looking as last time. UA with culture was not done at the last visit, and plan was to touch base today to see if symptoms had resolved after a trial of Augmentin. Given that she is still tender, a culture will be sent today. -UA with culture today  -Continue to monitor for worsening symptoms

## 2022-12-16 NOTE — Assessment & Plan Note (Signed)
Counseled patient that this is sometimes treated with steroid injections or surgery.  -Voltaren gel prescribed  -Referral to Sports Medicine

## 2022-12-16 NOTE — Assessment & Plan Note (Signed)
Has hx of Calcium pyrophosphate crystal arthritis. Uses Voltaren and flexeril. Refills sent today.

## 2022-12-16 NOTE — Progress Notes (Signed)
Established Patient Office Visit  Subjective   Patient ID: Wanda Mclaughlin, female    DOB: 11/06/1928  Age: 89 y.o. MRN: [id number]  Chief Complaint  Patient presents with   Diabetes   Hypertension   Numbness    fingertips    HPI   Pleasant 89 yo female with T2DM and HTN. Coming to follow up on sxs of a UTI she had two weeks ago.   Past Medical History:  Diagnosis Date   Anemia    Normal EGG, colonoscopy, and Sm. Bowel F/T 2006   Bereavement 08/18/2020   Bilateral hand pain 07/09/2016   Chronic knee pain    Chronic renal failure    Degenerative joint disease    Diabetes mellitus 11/05   type II   Diabetic peripheral neuropathy (HCC)    Duodenitis    Gastritis    Health care maintenance 03/15/2013   Health maintenance examination    Mammogram 10/11: Possible mass, right breast. No evidence of malignancy in left breast. Needle biopsy of left breast 12/11: No evidence of  carcinoma. Patient needs follow-up right breast diagnostic mammogram with ultrasound in 6 months.   History of diabetic gastroparesis 11/28/2017   Hyperlipidemia    Hypertension    Left knee pain 10/06/2017   Osteoporosis 09/2009   T score -2.8   Psychophysiological insomnia 11/15/2016   Vertigo     Review of Systems  Constitutional:  Negative for chills, fever and weight loss.  Respiratory:  Negative for cough.   Cardiovascular:  Negative for chest pain.  Musculoskeletal:  Positive for joint pain.  Neurological:  Negative for dizziness, weakness and headaches.     Objective:     BP (!) 142/68 (BP Location: Left Arm, Patient Position: Sitting, Cuff Size: Normal)   Pulse 88   Temp 98.3 F (36.8 C) (Oral)   Ht 5\' 4"  (1.626 m)   Wt 161 lb 9.6 oz (73.3 kg)   SpO2 98%   BMI 27.74 kg/m  BP Readings from Last 3 Encounters:  12/16/22 (!) 142/68  12/02/22 (!) 196/87  08/01/22 100/87   Wt Readings from Last 3 Encounters:  12/16/22 161 lb 9.6 oz (73.3 kg)  12/02/22 161 lb 6.4 oz (73.2 kg)   08/01/22 161 lb 6.4 oz (73.2 kg)     Physical Exam Cardiovascular:     Rate and Rhythm: Normal rate and regular rhythm.     Heart sounds: S1 normal and S2 normal. Heart sounds not distant. No murmur heard.    No friction rub. No gallop.  Pulmonary:     Breath sounds: No decreased breath sounds, wheezing, rhonchi or rales.  Abdominal:     General: Abdomen is protuberant. There is no distension or abdominal bruit.     Palpations: Abdomen is soft.     Tenderness: There is no abdominal tenderness. There is right CVA tenderness. There is no left CVA tenderness, guarding or rebound.  Musculoskeletal:     Right hand: No swelling, deformity or tenderness. Normal range of motion.     Left hand: Tenderness and bony tenderness present. No swelling or deformity. Normal range of motion.     Comments: Bony tenderness on the 5th MCP on the left. Finger will make a clicking sound, pop and then is able to extend.    No results found for any visits on 12/16/22.    The ASCVD Risk score (Arnett DK, et al., 2019) failed to calculate for the following reasons:   The 2019  ASCVD risk score is only valid for ages 91 to 83    Assessment & Plan:   Problem List Items Addressed This Visit       Cardiovascular and Mediastinum   Essential hypertension - Primary (Chronic)    BO was in the 190s on 8/5. She was re-started on amlodipine 10mg  daily and BP is still somewhat elevated. She reports that Bps have been in the 130s at  home. She will measure them and bring them back at next visit. Denies symptoms of hypotension -cw amlodipine 10mg  daily  -cw Hyzaar 100-25 daily -reassess at next visit        Digestive   GERD (gastroesophageal reflux disease)    Takes Omeprazole 20mg  for GERD. Refill sent.       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 Uses Voltaren and flexeril. Refills sent today.      Relevant Medications   diclofenac Sodium (VOLTAREN) 1 % GEL   cyclobenzaprine (FLEXERIL) 10 MG tablet   Trigger finger, left little finger    Counseled patient that this is sometimes treated with steroid injections or surgery.  -Voltaren gel prescribed  -Referral to Sports Medicine       Relevant Medications   diclofenac Sodium (VOLTAREN) 1 % GEL   Other Relevant Orders   Ambulatory referral to Sports Medicine     Other   Spasm of back muscles    Uses flexeril when back is having spasms. PRN use counseled.  -Refill sent      Relevant Medications   cyclobenzaprine (FLEXERIL) 10 MG tablet   Flank pain    Was seen on 8/5 with increased fatigue, weakness, blurriness of vision, and  tachycardia. She had flank pain on the right, but no hematuria, dysuria, polyuria or abdominal pain. She does have some CVA tenderness on exam but patient is not as ill-looking as last time. UA with culture was not done at the last visit, and plan was to touch base today to see if symptoms had resolved after a trial of Augmentin. Given that she is still tender, a culture will be sent today. -UA with culture today  -Continue to monitor for worsening symptoms       Other Visit Diagnoses     Urinary tract infection without hematuria, site unspecified       Relevant Orders   Urinalysis, Complete (81001)   Culture, Urine       Return in about 3 months (around 03/18/2023).    Manuela Neptune, MD

## 2022-12-16 NOTE — Assessment & Plan Note (Signed)
Uses flexeril when back is having spasms. PRN use counseled.  -Refill sent

## 2022-12-16 NOTE — Assessment & Plan Note (Signed)
Takes Omeprazole 20mg  for GERD. Refill sent.

## 2022-12-16 NOTE — Patient Instructions (Addendum)
Thank you, Wanda Mclaughlin for allowing [place] to provide your care today. Today we discussed   1) For the pain in your finger:   Take your tylenol (can take it up to three times daily) Apply Voltaren gel   Follow up with Sports Medicine   2) I will call you with the results of your urine    I have ordered the following labs for you:  Lab Orders         Culture, Urine         Urinalysis, Complete (81001)      Referrals ordered today:   Referral Orders         Ambulatory referral to Sports Medicine      I have ordered the following medication/changed the following medications:   Stop the following medications: Medications Discontinued During This Encounter  Medication Reason   diclofenac Sodium (VOLTAREN) 1 % GEL Reorder   omeprazole (PRILOSEC) 20 MG capsule Reorder   cyclobenzaprine (FLEXERIL) 10 MG tablet Reorder     Start the following medications: Meds ordered this encounter  Medications   diclofenac Sodium (VOLTAREN) 1 % GEL    Sig: Apply 2 g topically 4 (four) times daily.    Dispense:  100 g    Refill:  1   cyclobenzaprine (FLEXERIL) 10 MG tablet    Sig: TAKE 1 TABLET (10 MG TOTAL) BY MOUTH EVERY OTHER DAY AS NEEDED FOR MUSCLE SPASMS    Dispense:  15 tablet    Refill:  0    DX Code Needed  .   omeprazole (PRILOSEC) 20 MG capsule    Sig: Take 1 capsule (20 mg total) by mouth daily.    Dispense:  90 capsule    Refill:  3     Follow up: 3 months or sooner if needed    Remember: To call us if you do not hear back from Sports Medicine. Please have Sports Medicine send records to [place].   Should you have any questions or concerns please call the internal medicine clinic at [phone].     Manuela Neptune, MD  [facility]

## 2022-12-17 LAB — URINALYSIS, COMPLETE
Bilirubin, UA: NEGATIVE
Glucose, UA: NEGATIVE
Ketones, UA: NEGATIVE
Leukocytes,UA: NEGATIVE
Nitrite, UA: NEGATIVE
Protein,UA: NEGATIVE
RBC, UA: NEGATIVE
Specific Gravity, UA: 1.009 (ref 1.005–1.030)
Urobilinogen, Ur: 0.2 mg/dL (ref 0.2–1.0)
pH, UA: 7 (ref 5.0–7.5)

## 2022-12-17 LAB — MICROSCOPIC EXAMINATION
Bacteria, UA: NONE SEEN
Casts: NONE SEEN /LPF
RBC, Urine: NONE SEEN /hpf (ref 0–2)
WBC, UA: NONE SEEN /HPF (ref 0–5)

## 2022-12-17 NOTE — Progress Notes (Signed)
UA not concerning for a UTI.

## 2022-12-18 LAB — URINE CULTURE: Organism ID, Bacteria: NO GROWTH

## 2022-12-18 NOTE — Progress Notes (Signed)
Pt notified of results. No UTI.

## 2022-12-18 NOTE — Progress Notes (Signed)
Internal Medicine Clinic Attending  I was physically present during the key portions of the resident provided service and participated in the medical decision making of patient's management care. I reviewed pertinent patient test results.  The assessment, diagnosis, and plan were formulated together and I agree with the documentation in the resident's note.  Guilloud, Carolyn, MD  

## 2022-12-25 ENCOUNTER — Ambulatory Visit: Payer: 59 | Admitting: Family Medicine

## 2022-12-27 ENCOUNTER — Ambulatory Visit (INDEPENDENT_AMBULATORY_CARE_PROVIDER_SITE_OTHER): Payer: 59 | Admitting: Family Medicine

## 2022-12-27 ENCOUNTER — Other Ambulatory Visit: Payer: Self-pay

## 2022-12-27 VITALS — BP 137/66 | Ht 64.0 in | Wt 161.0 lb

## 2022-12-27 DIAGNOSIS — M79641 Pain in right hand: Secondary | ICD-10-CM

## 2022-12-27 DIAGNOSIS — M65351 Trigger finger, right little finger: Secondary | ICD-10-CM | POA: Diagnosis not present

## 2022-12-27 DIAGNOSIS — M79642 Pain in left hand: Secondary | ICD-10-CM

## 2022-12-27 MED ORDER — METHYLPREDNISOLONE ACETATE 40 MG/ML IJ SUSP
20.0000 mg | Freq: Once | INTRAMUSCULAR | Status: AC
Start: 2022-12-27 — End: 2022-12-27
  Administered 2022-12-27: 20 mg via INTRA_ARTICULAR

## 2022-12-27 NOTE — Patient Instructions (Signed)
Your hand pain is because of multiple issues in the hand. I think you have arthritis in the hand and the best thing for this would be to continue taking tylenol (1000mg ) three times per day and using topical treatments like Arnica cream, Voltaren gel, and Aspercreme

## 2022-12-27 NOTE — Progress Notes (Signed)
PCP: Rana Snare, DO  SUBJECTIVE:   HPI:  Patient is a 89 y.o. female here with chief complaint of bilateral hand pain and locking of right 5th finger. She reports this has been ongoing for > 1 year. She has tried tylenol and voltaren gel without much benefit. She describes the hand pain as soreness and stiffness primarily at the MCPs and PIPs. She has hx/ of OA in multiple sites - no recent imaging of the hands/fingers. Denies radiation of pain distally or proximally nor significant swelling in either hands. She is RHD.  ROS:     See HPI  PERTINENT  PMH / PSH FH / / SH:  Past Medical, Surgical, Social, and Family History Reviewed & Updated in the EMR.  Pertinent findings include:  Osteoarthritis in spine, bilateral knees Osteoporosis T2DM Remainder non-contributory  Allergies  Allergen Reactions   Lisinopril     REACTION: Possi ble ANGIOEDEMA     OBJECTIVE:  BP 137/66   Ht 5\' 4"  (1.626 m)   Wt 161 lb (73 kg)   BMI 27.64 kg/m   PHYSICAL EXAM:  GEN: Alert and Oriented, NAD, comfortable in exam room RESP: Unlabored respirations, symmetric chest rise PSY: normal mood, congruent affect   Bilateral Hand MSK EXAM: No significant deformity, erythema, ecchymosis, or erythema.  FROM though note locking of right 5th digit in flexion requiring manual extension. Normal sensation. TTP with compression of MCPs and PIPs, no TTP at [facility]. Palpable nodule at A3 pulley of right 5th digit. Negative Tinel/Phalens   ASSESSMENT & PLAN:  1. Bilateral hand pain 2. Trigger finger, right little finger  Hand pain presentation most consistent with OA. I advised she continue tylenol at 1000mg  TID and alternating topical voltaren, aspercreme, and arnica cream. Recommended she consider trial of compression hand gloves as well and ice PRN.  She does have pretty significant trigger finger involving right 5th finger, interestingly at the A3 pulley. We discussed treatment with consideration of CSI  which patient was amenable to. Procedure performed as described below. She tolerated the injection well and will follow-up in 1 month for recheck and consideration of repeat injection if triggering still present.   Procedure: Right 5th Trigger Finger Injection   After informed written consent timeout was performed and patient was seated on exam table. The flexor tendon nodule was palpated at the A3 pulley. The area overlying right 5th digit A3 pulley prepped with alcohol swab then injected with 0.5:0.51mL lidocaine: depomedrol 40mg /mL.  Patient tolerated procedure well without immediate complications.The patient was counseled as to the expected post-injection course, including the possibility of worsening of pain with steroid flare. Instructed as to concerning symptoms and advised to contact the office if these should arise.    Glean Salen, MD PGY-4, Sports Medicine Fellow [facility]

## 2022-12-28 NOTE — Progress Notes (Signed)
[facility]: Attending Note: I have examined the patient, reviewed the chart, discussed the assessment and plan with the Sports Medicine Fellow. I agree with assessment and treatment plan as detailed in the Fellow's note. I was present for procedure and assisted.  Patient tolerate extremely well.  She may need a second injection and I discussed this with her and her daughter.  She is limited by press because it is so I was very careful to explain all of this to her daughter who is her caretaker much of the time.  Would be happy to see her back at any time.  Also discussed red flags such as fever, erythema swelling or severe increase in pain after the injection with her and with her daughter.

## 2023-02-05 ENCOUNTER — Telehealth: Payer: Self-pay | Admitting: Student

## 2023-02-05 NOTE — Telephone Encounter (Signed)
-----   Message from Rana Snare sent at 02/04/2023 11:38 AM EDT ----- Regarding: RE: reschedule of an upcoming appt Hey! I wanted to follow up and see if the patient was willing to reschedule her appointment to 10/22 at 8:30 (Geriatrics clinic) per Dr. Mayford Knife' message? Thanks ----- Message ----- From: Miguel Aschoff, MD Sent: 01/30/2023   5:04 PM EDT To: Rana Snare, DO; Imp Front Desk Pool Subject: reschedule of an upcoming appt                 Staff, Ms. Spieth is a 89 yo who I would like to schedule in the geriatrics assessment clinic for a full evaluation when her PCP Dr. Sherrilee Gilles is working with me.  I'm hoping that she would be willing to change her f/u appt with Dr. Ned Card in Nov to Tuesday 02/18/23 at 8:30 (I have another assessment at 10:15).  I would present it as an opportunity for her to have over an hour to talk with Dr. Sherrilee Gilles about suggestions to support her in the home and keep her safe and healthy - things we don't always have time to discuss in regular appts.  It will be fun!  Please let me know if she is willing. Thank you, JW

## 2023-02-05 NOTE — Telephone Encounter (Signed)
Please refer to message below.  Spoke with patient's daughter, Harvel Quale.  Agreeable to appointment date change.  New appointment will be on 02/18/23 at 8:45 am with Dr. Mayford Knife in the geriatric clinic.

## 2023-02-16 ENCOUNTER — Encounter: Payer: Self-pay | Admitting: Internal Medicine

## 2023-02-17 ENCOUNTER — Other Ambulatory Visit: Payer: Self-pay | Admitting: Internal Medicine

## 2023-02-17 ENCOUNTER — Encounter: Payer: Self-pay | Admitting: Internal Medicine

## 2023-02-18 ENCOUNTER — Encounter: Payer: Self-pay | Admitting: Student

## 2023-02-18 ENCOUNTER — Ambulatory Visit (INDEPENDENT_AMBULATORY_CARE_PROVIDER_SITE_OTHER): Payer: 59 | Admitting: Student

## 2023-02-18 VITALS — BP 177/86 | HR 88 | Temp 98.4°F | Ht 64.0 in | Wt 165.2 lb

## 2023-02-18 DIAGNOSIS — Z7984 Long term (current) use of oral hypoglycemic drugs: Secondary | ICD-10-CM

## 2023-02-18 DIAGNOSIS — R011 Cardiac murmur, unspecified: Secondary | ICD-10-CM

## 2023-02-18 DIAGNOSIS — E1122 Type 2 diabetes mellitus with diabetic chronic kidney disease: Secondary | ICD-10-CM

## 2023-02-18 DIAGNOSIS — I10 Essential (primary) hypertension: Secondary | ICD-10-CM

## 2023-02-18 DIAGNOSIS — M170 Bilateral primary osteoarthritis of knee: Secondary | ICD-10-CM

## 2023-02-18 DIAGNOSIS — L282 Other prurigo: Secondary | ICD-10-CM

## 2023-02-18 DIAGNOSIS — G3184 Mild cognitive impairment, so stated: Secondary | ICD-10-CM

## 2023-02-18 DIAGNOSIS — I129 Hypertensive chronic kidney disease with stage 1 through stage 4 chronic kidney disease, or unspecified chronic kidney disease: Secondary | ICD-10-CM

## 2023-02-18 DIAGNOSIS — E1140 Type 2 diabetes mellitus with diabetic neuropathy, unspecified: Secondary | ICD-10-CM

## 2023-02-18 DIAGNOSIS — H9113 Presbycusis, bilateral: Secondary | ICD-10-CM

## 2023-02-18 DIAGNOSIS — N1832 Chronic kidney disease, stage 3b: Secondary | ICD-10-CM

## 2023-02-18 DIAGNOSIS — M810 Age-related osteoporosis without current pathological fracture: Secondary | ICD-10-CM

## 2023-02-18 DIAGNOSIS — M62830 Muscle spasm of back: Secondary | ICD-10-CM | POA: Diagnosis not present

## 2023-02-18 MED ORDER — SARNA 0.5-0.5 % EX LOTN: 1.0000 | TOPICAL_LOTION | CUTANEOUS | 0 refills | Status: DC | PRN

## 2023-02-18 NOTE — Assessment & Plan Note (Signed)
Hearing aids in place which has helped. Has not followed up with audiology recently but advised patient and daughter to do so which they are in agreement with.

## 2023-02-18 NOTE — Patient Instructions (Addendum)
Thank you, Ms.[name] for allowing [place] to provide your care today. Today we discussed your overall health and evaluation by our geriatrics clinic. Thank you for answering all the questions and completing the exam.   -I sent Sarna lotion to help with itching.  -Blood work today to check vitamin D and electrolytes and kidney function. -Continue to stay active, good appetite and staying hydrated.  -Will review medications and refill as needed.  -Discussed going down on Flexeril (muscle relaxer) to 5 mg daily As Needed for spasms. Discussed risks of confusion and drowsiness.  -Advise taking muscle relaxer at bedtime to avoid daily sleepiness.  -Will continue checking blood pressure.   [facility] [address], [place] [address] [phone]  I have ordered the following medication/changed the following medications:   Stop the following medications: There are no discontinued medications.   Start the following medications: Meds ordered this encounter  Medications   camphor-menthol (SARNA) lotion    Sig: Apply 1 Application topically as needed for itching.    Dispense:  222 mL    Refill:  0     Follow up: 2 months  Should you have any questions or concerns please call the internal medicine clinic at [phone].    Rana Snare, D.O. [facility]

## 2023-02-18 NOTE — Assessment & Plan Note (Signed)
Patient has been on Flexeril 10 mg PRN for spasms for quite some time. States Wanda Mclaughlin currently only takes it 2-3 times per WEEK and only one tablet max a day. Denies consistently taking it daily or every other day. Denies any side effects now. Counseled patient on risks and patient hesitant with stopping it completely. Shared decision making in reducing tablet dose and use it only as needed for severe spasms.   Plan -Decrease Flexeril to 5 mg tablets  -Counseled on side effects and potentially weaning off of it

## 2023-02-18 NOTE — Assessment & Plan Note (Signed)
Previously tried steroid injections with mixed results. Currently taking tylenol, at times ibuprofen and flexeril PRN.   Gait is stable with cane on right hand.   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

## 2023-02-18 NOTE — Assessment & Plan Note (Signed)
Mini-Cog and MOCA done today. MOCA score was 18. However, her findings have not significantly affected her ADLs. Remains functional and active at home and able to complete most/some of her IADL. Not driving but daughter takes her to grocery store and other places.   Plan -Continue monitoring and offer resources

## 2023-02-18 NOTE — Progress Notes (Addendum)
Geriatrics Assessment Clinic - Initial Consultation  PCP: Rana Snare, DO Referred by: Rana Snare, DO Purpose of Consult: Overall Assessment  Present at today's visit: patient, patient's daughter Harvel Quale)  Wanda Mclaughlin is a 89 y.o. female with history of HTN, DM, osteoporosis, OA, CKD3B, systolic murmur who presents as a long term [facility] patient for overall assessment and evaluation.   Patient states she grew up in [place] but moved to [place] about 50 years ago. Reports having about 11 siblings, 5 living children, and several grandchildren and great grandchildren. 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   She is currently living alone. Most days stays at home and watch tv. At times goes to her children's house. Daughter takes her to the grocery store. She has a friend/neighbor across the street who helps/visits her.   She uses a cane on right hand with walking. Her house has 2 steps to front door and 4-5 steps at back door with railings at both doors.   States she can take her own trash out. Does not drive. Denies any falls in past year, daughter agrees with statement. Denies any rugs in house.  She continues to be dealing with her arthritis.  States her appetite is good and eats about 2-3 meals a day (frozen and at times cook). She at times cook beans, rice, pork chop, chicken, tv dinners, fish, cabbage, greens. Her fluid intake ranges from 3-4 water bottles, juice, once in a while soda.  Had no trouble voiding or any acute urinary symptoms. Reports BM every other day. Takes a stool softener about twice a week.   States her mother lived until she was in mid 72s, sister lived until her upper 49s. Reports her education level was about 10-11th grade.   Functional history:  ADL: does not use her bath tub due to height but instead does sponge baths in basin, able to dress herself including socks and shoes, able to toilet, able  to transfer out of bed independently, able to feed self, uses cane with right hand IADL: not driving and daughter drives her around for her shopping needs, able to use home phone, able to do laundry/housekeeping, able to prepare/fix food herself, able to manager her finances and medications  Geriatric ROS: Denies dizziness, lightheadness, SOB, chest pain, urinary symptoms, constipation, appetite changes. Endorses bilateral knee pain, generalized pruritus.   Problem List: Patient Active Problem List   Diagnosis Date Noted   Mild cognitive impairment 02/18/2023   Trigger finger, left little finger 12/16/2022   GERD (gastroesophageal reflux disease) 12/16/2022   Murmur 01/28/2022   Pruritic rash 10/16/2021   Spasm of back muscles 11/28/2017   CKD stage 3b, GFR 30-44 ml/min (HCC) 03/29/2016   Presbycusis of both ears 03/12/2016   Calcium pyrophosphate crystal arthritis + osteoarthritis 09/05/2015   Healthcare maintenance 03/15/2013   Osteoporosis 09/29/2009   Anemia 10/20/2006   Diabetes mellitus with neuropathy (HCC) 02/13/2006   Essential hypertension 02/13/2006   Osteoarthritis of bilateral knees 02/13/2006    Medications:  Current Outpatient Medications:    camphor-menthol (SARNA) lotion, Apply 1 Application topically as needed for itching., Disp: 222 mL, Rfl: 0   acetaminophen (TYLENOL) 325 MG tablet, Take 650 mg by mouth every 6 (six) hours as needed for mild pain., Disp: , Rfl:    amLODipine (NORVASC) 10 MG tablet, Take 1 tablet (10 mg total) by mouth daily., Disp: 30 tablet, Rfl: 11  betamethasone valerate ointment (VALISONE) 0.1 %, Apply 1 Application topically 2 (two) times daily., Disp: 30 g, Rfl: 0   cyclobenzaprine (FLEXERIL) 10 MG tablet, TAKE 1 TABLET (10 MG TOTAL) BY MOUTH EVERY OTHER DAY AS NEEDED FOR MUSCLE SPASMS, Disp: 15 tablet, Rfl: 0   diclofenac Sodium (VOLTAREN) 1 % GEL, Apply 2 g topically 4 (four) times daily., Disp: 100 g, Rfl: 1   diphenhydrAMINE-zinc  acetate (BENADRYL ITCH STOPPING) cream, Apply topically 3 (three) times daily as needed for itching., Disp: 28.3 g, Rfl: 0   gabapentin (NEURONTIN) 400 MG capsule, Take 1 capsule (400 mg total) by mouth 2 (two) times daily., Disp: 180 capsule, Rfl: 1   losartan-hydrochlorothiazide (HYZAAR) 100-25 MG tablet, Take 1 tablet by mouth daily., Disp: 90 tablet, Rfl: 1   metFORMIN (GLUCOPHAGE) 500 MG tablet, TAKE 1 TABLET BY MOUTH EVERY DAY WITH BREAKFAST, Disp: 90 tablet, Rfl: 3   omeprazole (PRILOSEC) 20 MG capsule, Take 1 capsule (20 mg total) by mouth daily., Disp: 90 capsule, Rfl: 3   Objective: Today's Vitals   02/18/23 0926 02/18/23 0928 02/18/23 0929 02/18/23 0930  BP: (!) 188/92 (!) 155/80 (!) 166/84 (!) 177/86  Pulse: 84 82 84 88  Temp: 98.4 F (36.9 C)     TempSrc: Oral     SpO2: 100%     Weight: 165 lb 3.2 oz (74.9 kg)     Height: 5\' 4"  (1.626 m)     PainSc: 0-No pain      Body mass index is 28.36 kg/m.   Physical Exam Constitutional:      General: She is not in acute distress.    Appearance: She is normal weight. She is not ill-appearing.  HENT:     Head: Normocephalic and atraumatic.     Right Ear: Tympanic membrane and external ear normal.     Left Ear: Tympanic membrane and external ear normal.     Ears:     Comments: Hearing aids in place bilaterally    Mouth/Throat:     Mouth: Mucous membranes are moist.     Pharynx: Oropharynx is clear.  Eyes:     General: No scleral icterus.    Extraocular Movements: Extraocular movements intact.     Conjunctiva/sclera: Conjunctivae normal.  Neck:     Thyroid: No thyroid mass, thyromegaly or thyroid tenderness.  Cardiovascular:     Rate and Rhythm: Normal rate and regular rhythm.     Pulses: Normal pulses.          Radial pulses are 2+ on the right side and 2+ on the left side.       Dorsalis pedis pulses are 2+ on the right side and 2+ on the left side.     Heart sounds: Murmur heard.     Comments: Grade 3/6 systolic  murmur best heard at RUSB Pulmonary:     Effort: Pulmonary effort is normal.     Breath sounds: Normal breath sounds.  Abdominal:     General: Abdomen is flat.     Palpations: Abdomen is soft.  Musculoskeletal:     Cervical back: Neck supple.     Right lower leg: No edema.     Left lower leg: No edema.     Comments: Balance Test: able to perform side by side and semi-tandem; able to perform tandem with assistance  Chair Stand Test: able to perform without assistance x 5   Lymphadenopathy:     Cervical: No cervical adenopathy.  Skin:  General: Skin is warm.     Comments: Healed lesion that is hyperpigmented at right thigh above knee. No bleeding, drainage, warmth or skin peeling.   Neurological:     Mental Status: She is alert and oriented to person, place, and time. Mental status is at baseline.     Motor: Motor function is intact.     Coordination: Romberg sign negative. Finger-Nose-Finger Test normal.     Gait: Gait is intact.     Comments: Uses cane on right hand with gait. Intact grip strength bilaterally. Vibratory sensation testing of bilateral feet intact with eyes closed. Monofilament testing of bilateral feet mostly intact (missed 2 spots) with eyes closed.   Psychiatric:        Mood and Affect: Mood and affect normal.        Behavior: Behavior normal.    Assessment & Plan:  I met with and evaluated Wanda Mclaughlin along with her daughter present in room for geriatric assessment. Appears well today and no new acute concerns. Prior concerns include chronic arthritic pains and pruritus from recently resolved rash. Currently living alone and denies any recent falls or injuries. Able to perform ADLs and most IADLs with some assistance by daughter. Able to ambulate well in her living space with cane. She is confident with continuing caring for herself, which patient's daughter agrees with today. She is aware of most of her limitations. Able to manage her finances and medications  on her own per daughter. There is room for medication simplification. This includes consolidating her BP meds (losartan-hydrochlorothiazide and amlodipine) once we achieve BP goal. Discussed with patient and patient's daughter about reducing Flexeril to 5 mg every other day as needed which patient after thorough discussion of possible risks agrees to reducing dose. Discussed d/c metformin given good DM control but patient declined at this time. Her cognitive assessment today did show mild cognitive impairment in domains of visuospatial, memory and recall but based on report from patient and patient's daughter she is able to function appropriately at home. Did not discuss advance directives at this visit, plan to discuss at future visit. Will continue optimizing patient's safety, mobility and medications at future visits.   Osteoporosis No recent falls or falls in past year. Not currently on vitamin D supplementation. Last DXA was in 2011 with T score of -2.8 of left hip. Noted to have been on Fosamax for few years but was discontinued in 2013. Patient states staying active.   Plan -Encourage body weight activity/exercise  -Check Vitamin D level -Consider restarting vitamin D-calcium supplementation  -Continue fall prevention strategies   Osteoarthritis of bilateral knees Previously tried steroid injections with mixed results. Currently taking tylenol, at times ibuprofen and flexeril PRN.   Gait is stable with cane on right hand.   Plan -Continue tylenol 1000 mg BID -Avoid NSAID in setting of CKD3b -Continue supportive therapies  -Consider if patient interested referral to PT  CKD stage 3b, GFR 30-44 ml/min (HCC) Stable. No acute concerns or new symptoms.  Plan -Repeat BMP  Spasm of back muscles Patient has been on Flexeril 10 mg PRN for spasms for quite some time. States she currently only takes it 2-3 times per WEEK and only one tablet max a day. Denies consistently taking it daily or  every other day. Denies any side effects now. Counseled patient on risks and patient hesitant with stopping it completely. Shared decision making in reducing tablet dose and use it only as needed for severe spasms.  Plan -Decrease Flexeril to 5 mg tablets  -Counseled on side effects and potentially weaning off of it    Diabetes mellitus with neuropathy (HCC) Last A1c was 6.6 in August. Stable. No new concerns. Notes hx of neuropathy including tingling and burning sensation in hands and feet. States taking gabapentin with relief. Foot exam performed today without significant changes/findings.  -Continue metformin 500 mg daily -Continue gabapentin 400 mg BID -Advised no further daily glucose meter checks unless symptomatic   Essential hypertension BP elevated today with systolic ranging 295-621. Negative for orthostatics as part of geriatrics initial assessment. Patient reports adherence to her medications. Denies any headache, chest pain, SOB, acute vision changes. Prior OV noted systolic 100 which her amlodipine was d/c. However, recent OV showed elevated BP and amlodipine was restarted. Will continue current regimen and encourage daily medication adherence. If BP remains elevated, may need to consider adding on fourth agent such as spironolactone.   Plan -Continue amlodipine 10 mg  -Continue losartan-hydrochlorothiazide 100-25 mg -Repeat BMP -Reassess at next OV  in 2-3 months  ADDENDUM: BMP showed fairly stable GFR/creatinine and electrolytes WNL.  Presbycusis of both ears Hearing aids in place which has helped. Has not followed up with audiology recently but advised patient and daughter to do so which they are in agreement with.   Mild cognitive impairment Mini-Cog and MOCA done today. MOCA score was 18. However, her findings have not significantly affected her ADLs. Also, exam at times limited due to poor hearing even with her hearing aids on. Remains functional and active at home and  able to complete most/some of her IADL. Denies signs of depression at this time. Not driving but daughter takes her to grocery store and other places.   Plan -Continue monitoring and offer resources [facility] info provided at [facility]) -Optimize medications for safety  -Discussed fall prevention strategies   Pruritic rash Noted rash has improved and mainly hyperpigmented findings. Still endorses generalized pruritus that prior creams have helped temporarily. No new detergents, cleaning products. No new foods. No pets or concerns for insects/rodents in household. Exam showed hyperpigmented lesions which appeared improved from previous photos.  Plan -Continue monitoring for any signs of worsening or recurrence  -Sarna lotion PRN  Murmur Previously noted and exam today heard systolic murmur over RUSB. Suspect likely aortic valve sclerosis and at worse stenosis. Last echo in 2015 without significant findings but noted mildly calcified aortic leaflets. Patient is not symptomatic and denies any recent dizziness, lightheadedness or episodes of syncope. Did not see significant benefit at this time to repeat echo due to asymptomatic at her current age.    FOLLOW UP: Return in 2-3 months for routine PCP visit.   Signed: Rana Snare, DO Internal Medicine Resident PGY-2 02/18/23

## 2023-02-18 NOTE — Assessment & Plan Note (Signed)
Noted rash has improved and mainly hyperpigmented findings. Still endorses generalized pruritus that prior creams have helped temporarily. No new detergents, cleaning products. No new foods. No pets or concerns for insects/rodents in household. Exam showed hyperpigmented lesions which appeared improved from previous photos.  Plan -Continue monitoring for any signs of worsening or recurrence  -Sarna lotion PRN

## 2023-02-18 NOTE — Assessment & Plan Note (Signed)
No recent falls or falls in past year. Not currently on vitamin D supplementation. Last DXA was in 2011 with T score of -2.8 of left hip. Noted to have been on Fosamax for few years but was discontinued in 2013. Patient states staying active.   Plan -Encourage body weight activity/exercise  -Check Vitamin D level -Consider restarting vitamin D-calcium supplementation  -Continue fall prevention strategies

## 2023-02-18 NOTE — Addendum Note (Signed)
Addended by: Rana Snare on: 02/18/2023 05:19 PM   Modules accepted: Level of Service

## 2023-02-18 NOTE — Assessment & Plan Note (Addendum)
Last A1c was 6.6 in August. Stable. No new concerns. Notes hx of neuropathy including tingling and burning sensation in hands and feet. States taking gabapentin with relief. Foot exam performed today without significant changes/findings.  -Continue metformin 500 mg daily -Continue gabapentin 400 mg BID -Advised no further daily glucose meter checks unless symptomatic

## 2023-02-18 NOTE — Assessment & Plan Note (Signed)
Stable. No acute concerns or new symptoms.  Plan -Repeat BMP

## 2023-02-18 NOTE — Assessment & Plan Note (Addendum)
BP elevated today with systolic ranging 161-096. Negative for orthostatics as part of geriatrics initial assessment. Patient reports adherence to her medications. Denies any headache, chest pain, SOB, acute vision changes. Prior OV noted systolic 100 which her amlodipine was d/c. However, recent OV showed elevated BP and amlodipine was restarted. Will continue current regimen and encourage daily medication adherence. If BP remains elevated, may need to consider adding on fourth agent such as spironolactone.   Plan -Continue amlodipine 10 mg  -Continue losartan-hydrochlorothiazide 100-25 mg -Repeat BMP -Reassess at next OV  in 2-3 months  ADDENDUM: BMP showed fairly stable GFR/creatinine and electrolytes WNL.

## 2023-02-19 LAB — BMP8+ANION GAP
Anion Gap: 15 mmol/L (ref 10.0–18.0)
BUN/Creatinine Ratio: 18 (ref 12–28)
BUN: 28 mg/dL (ref 10–36)
CO2: 21 mmol/L (ref 20–29)
Calcium: 9.5 mg/dL (ref 8.7–10.3)
Chloride: 104 mmol/L (ref 96–106)
Creatinine, Ser: 1.59 mg/dL — ABNORMAL HIGH (ref 0.57–1.00)
Glucose: 101 mg/dL — ABNORMAL HIGH (ref 70–99)
Potassium: 4.2 mmol/L (ref 3.5–5.2)
Sodium: 140 mmol/L (ref 134–144)
eGFR: 30 mL/min/{1.73_m2} — ABNORMAL LOW (ref >59)

## 2023-02-19 LAB — VITAMIN D 25 HYDROXY (VIT D DEFICIENCY, FRACTURES): Vit D, 25-Hydroxy: 29.7 ng/mL — ABNORMAL LOW (ref 30.0–100.0)

## 2023-02-19 NOTE — Assessment & Plan Note (Signed)
Previously noted and exam today heard systolic murmur over RUSB. Suspect likely aortic valve sclerosis and at worse stenosis. Last echo in 2015 without significant findings but noted mildly calcified aortic leaflets. Patient is not symptomatic and denies any recent dizziness, lightheadedness or episodes of syncope. Did not see significant benefit at this time to repeat echo due to asymptomatic at her current age.

## 2023-02-20 MED ORDER — CYCLOBENZAPRINE HCL 5 MG PO TABS: ORAL_TABLET | ORAL | Status: DC

## 2023-02-20 NOTE — Addendum Note (Signed)
Addended by: Rana Snare on: 02/20/2023 07:48 AM   Modules accepted: Orders

## 2023-03-03 NOTE — Progress Notes (Signed)
Internal Medicine Clinic Attending  I was physically present during the key portions of the resident provided service and participated in the medical decision making of patient's management care. I reviewed pertinent patient test results.  The assessment, diagnosis, and plan were formulated together and I agree with the documentation in the resident's note.  Williams, Julie Anne, MD  

## 2023-03-18 ENCOUNTER — Encounter: Payer: 59 | Admitting: Internal Medicine

## 2023-05-05 ENCOUNTER — Encounter: Payer: 59 | Admitting: Student

## 2023-05-06 ENCOUNTER — Other Ambulatory Visit: Payer: Self-pay

## 2023-05-06 DIAGNOSIS — E1140 Type 2 diabetes mellitus with diabetic neuropathy, unspecified: Secondary | ICD-10-CM

## 2023-05-06 MED ORDER — GABAPENTIN 400 MG PO CAPS
400.0000 mg | ORAL_CAPSULE | Freq: Two times a day (BID) | ORAL | 1 refills | Status: DC
Start: 2023-05-06 — End: 2023-08-13

## 2023-05-20 ENCOUNTER — Encounter: Payer: Self-pay | Admitting: Student

## 2023-05-20 ENCOUNTER — Other Ambulatory Visit: Payer: Self-pay | Admitting: Student

## 2023-05-20 ENCOUNTER — Ambulatory Visit: Payer: 59 | Admitting: Student

## 2023-05-20 VITALS — BP 168/77 | HR 75 | Temp 98.4°F | Ht 67.0 in | Wt 160.8 lb

## 2023-05-20 DIAGNOSIS — I10 Essential (primary) hypertension: Secondary | ICD-10-CM | POA: Diagnosis not present

## 2023-05-20 DIAGNOSIS — E559 Vitamin D deficiency, unspecified: Secondary | ICD-10-CM | POA: Diagnosis not present

## 2023-05-20 DIAGNOSIS — Z23 Encounter for immunization: Secondary | ICD-10-CM

## 2023-05-20 DIAGNOSIS — E1140 Type 2 diabetes mellitus with diabetic neuropathy, unspecified: Secondary | ICD-10-CM | POA: Diagnosis not present

## 2023-05-20 DIAGNOSIS — I129 Hypertensive chronic kidney disease with stage 1 through stage 4 chronic kidney disease, or unspecified chronic kidney disease: Secondary | ICD-10-CM

## 2023-05-20 DIAGNOSIS — M62830 Muscle spasm of back: Secondary | ICD-10-CM

## 2023-05-20 DIAGNOSIS — N1832 Chronic kidney disease, stage 3b: Secondary | ICD-10-CM

## 2023-05-20 DIAGNOSIS — E1122 Type 2 diabetes mellitus with diabetic chronic kidney disease: Secondary | ICD-10-CM | POA: Diagnosis not present

## 2023-05-20 DIAGNOSIS — Z7984 Long term (current) use of oral hypoglycemic drugs: Secondary | ICD-10-CM | POA: Diagnosis not present

## 2023-05-20 LAB — POCT GLYCOSYLATED HEMOGLOBIN (HGB A1C): Hemoglobin A1C: 6.2 % — AB (ref 4.0–5.6)

## 2023-05-20 LAB — GLUCOSE, CAPILLARY: Glucose-Capillary: 94 mg/dL (ref 70–99)

## 2023-05-20 MED ORDER — VITAMIN D 25 MCG (1000 UNIT) PO TABS: 1000.0000 [IU] | ORAL_TABLET | Freq: Every day | ORAL | 2 refills | Status: DC

## 2023-05-20 MED ORDER — CYCLOBENZAPRINE HCL 5 MG PO TABS: ORAL_TABLET | ORAL | 0 refills | Status: DC

## 2023-05-20 MED ORDER — LOSARTAN POTASSIUM-HCTZ 100-25 MG PO TABS: 1.0000 | ORAL_TABLET | Freq: Every day | ORAL | 1 refills | Status: DC

## 2023-05-20 NOTE — Assessment & Plan Note (Signed)
Administered flu vaccine today.

## 2023-05-20 NOTE — Assessment & Plan Note (Signed)
A1c today 6.2, down from 6.6 5 months ago.  She has been using her metformin with no GI concerns.  Plan: -Continue metformin 500 mg daily -Continue gabapentin 4 mg twice daily -Can stop checking A1cs  at this point

## 2023-05-20 NOTE — Assessment & Plan Note (Signed)
Most recent lab work showing decreased vitamin D levels.  Advised the patient to start vitamin D supplementation.

## 2023-05-20 NOTE — Progress Notes (Signed)
CC: Follow-up visit  HPI:  Wanda Mclaughlin is a 89 y.o.-year-old female with a past medical history of hypertension, GERD, type 2 diabetes, CKD stage IIIb who presents for follow-up appointment.  Please see assessment and plan for full HPI.  Medications: Hypertension: Amlodipine 10 mg daily, losartan-HCTZ 100-25 mg daily GERD: Omeprazole 20 mg daily Diabetic neuropathy: 400 mg twice daily Diabetes: Metformin 500 mg daily Back pain: Flexeril 5 mg as needed Itching: Benadryl cream  Recently seen in the geriatrics clinic.  On 02/18/2023. Patient was told to continue Tylenol.  Patient was decreased to 5 mg of Flexeril.  Patient was checked for diabetes mellitus.  A1c at that time was 6.6 in August.  Past Medical History:  Diagnosis Date   Anemia    Normal EGG, colonoscopy, and Sm. Bowel F/T 2006   Bereavement 08/18/2020   Bilateral hand pain 07/09/2016   Chronic knee pain    Chronic renal failure    Degenerative joint disease    Diabetes mellitus 11/05   type II   Diabetic peripheral neuropathy (HCC)    Duodenitis    Gastritis    Health care maintenance 03/15/2013   Health maintenance examination    Mammogram 10/11: Possible mass, right breast. No evidence of malignancy in left breast. Needle biopsy of left breast 12/11: No evidence of  carcinoma. Patient needs follow-up right breast diagnostic mammogram with ultrasound in 6 months.   History of diabetic gastroparesis 11/28/2017   Hyperlipidemia    Hypertension    Left knee pain 10/06/2017   Osteoporosis 09/2009   T score -2.8   Psychophysiological insomnia 11/15/2016   Vertigo      Current Outpatient Medications:    cholecalciferol (VITAMIN D3) 25 MCG (1000 UNIT) tablet, Take 1 tablet (1,000 Units total) by mouth daily., Disp: 30 tablet, Rfl: 2   acetaminophen (TYLENOL) 325 MG tablet, Take 650 mg by mouth every 6 (six) hours as needed for mild pain., Disp: , Rfl:    amLODipine (NORVASC) 10 MG tablet, Take 1 tablet (10 mg  total) by mouth daily., Disp: 30 tablet, Rfl: 11   betamethasone valerate ointment (VALISONE) 0.1 %, Apply 1 Application topically 2 (two) times daily., Disp: 30 g, Rfl: 0   camphor-menthol (SARNA) lotion, Apply 1 Application topically as needed for itching., Disp: 222 mL, Rfl: 0   cyclobenzaprine (FLEXERIL) 5 MG tablet, TAKE 1 TABLET (5 MG) BY MOUTH EVERY OTHER DAY AS NEEDED FOR MUSCLE SPASMS, Disp: 30 tablet, Rfl: 0   diclofenac Sodium (VOLTAREN) 1 % GEL, Apply 2 g topically 4 (four) times daily., Disp: 100 g, Rfl: 1   diphenhydrAMINE-zinc acetate (BENADRYL ITCH STOPPING) cream, Apply topically 3 (three) times daily as needed for itching., Disp: 28.3 g, Rfl: 0   gabapentin (NEURONTIN) 400 MG capsule, Take 1 capsule (400 mg total) by mouth 2 (two) times daily., Disp: 180 capsule, Rfl: 1   losartan-hydrochlorothiazide (HYZAAR) 100-25 MG tablet, Take 1 tablet by mouth daily., Disp: 90 tablet, Rfl: 1   metFORMIN (GLUCOPHAGE) 500 MG tablet, TAKE 1 TABLET BY MOUTH EVERY DAY WITH BREAKFAST, Disp: 90 tablet, Rfl: 3   omeprazole (PRILOSEC) 20 MG capsule, Take 1 capsule (20 mg total) by mouth daily., Disp: 90 capsule, Rfl: 3  Review of Systems:   Negative except for what is stated in HPI  Physical Exam:  Vitals:   05/20/23 1100 05/20/23 1131  BP: (!) 158/82 (!) 168/77  Pulse: 100 75  Weight: 160 lb 12.8 oz (72.9 kg)  Height: 5\' 7"  (1.702 m)     General: Patient is sitting comfortably in the room  Cardio: Regular rate and rhythm, grade 2 out of 6 systolic murmur appreciated best heard at apex Pulmonary: Clear to ausculation bilaterally with no rales, rhonchi, and crackles  Extremities: Bilateral lower extremities with no ulcers appreciated.  2+ pedal pulses.  Good sensation.   Assessment & Plan:   Essential hypertension Patient has a past medical history of hypertension.  Current medications include amlodipine 10 mg daily and losartan-HCTZ 100-25 mg daily.  She denies any chest pain,  shortness of breath, vision changes, or headaches.  She states she is adherent to her medication.  Blood pressure today 158/82 and 168/77.  Plan: -Continue amlodipine 10 mg daily -Continue losartan-HCTZ 100-25 mg daily -Evaluate BMP  Diabetes mellitus with neuropathy (HCC) A1c today 6.2, down from 6.6 5 months ago.  She has been using her metformin with no GI concerns.  Plan: -Continue metformin 500 mg daily -Continue gabapentin 4 mg twice daily -Can stop checking A1cs  at this point  CKD stage 3b, GFR 30-44 ml/min (HCC) Reviewing past few BMPs, kidney function seems to be declining.  Will check BMP today.  If continues to decline, will likely need to refer to nephrology.  Plan: -Follow-up BMP  Flu vaccine need Administered flu vaccine today.  Vitamin D deficiency Most recent lab work showing decreased vitamin D levels.  Advised the patient to start vitamin D supplementation.  Patient discussed with Dr. Marquita Palms, DO PGY-2 Internal Medicine Resident  Pager: [phone]

## 2023-05-20 NOTE — Assessment & Plan Note (Signed)
Patient has a past medical history of hypertension.  Current medications include amlodipine 10 mg daily and losartan-HCTZ 100-25 mg daily.  She denies any chest pain, shortness of breath, vision changes, or headaches.  She states she is adherent to her medication.  Blood pressure today 158/82 and 168/77.  Plan: -Continue amlodipine 10 mg daily -Continue losartan-HCTZ 100-25 mg daily -Evaluate BMP

## 2023-05-20 NOTE — Assessment & Plan Note (Signed)
Reviewing past few BMPs, kidney function seems to be declining.  Will check BMP today.  If continues to decline, will likely need to refer to nephrology.  Plan: -Follow-up BMP

## 2023-05-20 NOTE — Patient Instructions (Addendum)
Wanda Mclaughlin,Thank you for allowing me to take part in your care today.  Here are your instructions.  1.  I have refilled your medications today.  2.  I have wrote you for some vitamin D supplementation and refilled your medications.  3.  I am checking your lab work today to evaluate your kidney function.  4.  Please come back in 3 months for high blood pressure follow-up.  5.  You have received your flu vaccine today  6.  Your A1c today is 6.2.  Thank you, Dr. Allena Katz  If you have any other questions please contact the internal medicine clinic at [phone] If it is after hours, please call the  hospital at [phone] and then ask the person who picks up for the resident on call.

## 2023-05-21 LAB — BMP8+ANION GAP
Anion Gap: 15 mmol/L (ref 10.0–18.0)
BUN/Creatinine Ratio: 15 (ref 12–28)
BUN: 22 mg/dL (ref 10–36)
CO2: 21 mmol/L (ref 20–29)
Calcium: 9.6 mg/dL (ref 8.7–10.3)
Chloride: 106 mmol/L (ref 96–106)
Creatinine, Ser: 1.49 mg/dL — ABNORMAL HIGH (ref 0.57–1.00)
Glucose: 100 mg/dL — ABNORMAL HIGH (ref 70–99)
Potassium: 3.6 mmol/L (ref 3.5–5.2)
Sodium: 142 mmol/L (ref 134–144)
eGFR: 33 mL/min/{1.73_m2} — ABNORMAL LOW (ref >59)

## 2023-05-26 ENCOUNTER — Emergency Department (HOSPITAL_COMMUNITY)
Admission: EM | Admit: 2023-05-26 | Discharge: 2023-05-26 | Disposition: A | Discharge status: Home / Self Care | Payer: 59 | Attending: Emergency Medicine | Admitting: Emergency Medicine

## 2023-05-26 ENCOUNTER — Emergency Department (HOSPITAL_COMMUNITY): Payer: 59

## 2023-05-26 ENCOUNTER — Other Ambulatory Visit: Payer: Self-pay

## 2023-05-26 DIAGNOSIS — Z79899 Other long term (current) drug therapy: Secondary | ICD-10-CM | POA: Insufficient documentation

## 2023-05-26 DIAGNOSIS — I700 Atherosclerosis of aorta: Secondary | ICD-10-CM | POA: Diagnosis not present

## 2023-05-26 DIAGNOSIS — I10 Essential (primary) hypertension: Secondary | ICD-10-CM | POA: Diagnosis not present

## 2023-05-26 DIAGNOSIS — I771 Stricture of artery: Secondary | ICD-10-CM | POA: Diagnosis not present

## 2023-05-26 LAB — CBC WITH DIFFERENTIAL/PLATELET
Abs Immature Granulocytes: 0.02 10*3/uL (ref 0.00–0.07)
Basophils Absolute: 0 10*3/uL (ref 0.0–0.1)
Basophils Relative: 1 %
Eosinophils Absolute: 0.3 10*3/uL (ref 0.0–0.5)
Eosinophils Relative: 5 %
HCT: 31.1 % — ABNORMAL LOW (ref 36.0–46.0)
Hemoglobin: 10 g/dL — ABNORMAL LOW (ref 12.0–15.0)
Immature Granulocytes: 0 %
Lymphocytes Relative: 30 %
Lymphs Abs: 1.9 10*3/uL (ref 0.7–4.0)
MCH: 32.8 pg (ref 26.0–34.0)
MCHC: 32.2 g/dL (ref 30.0–36.0)
MCV: 102 fL — ABNORMAL HIGH (ref 80.0–100.0)
Monocytes Absolute: 0.5 10*3/uL (ref 0.1–1.0)
Monocytes Relative: 8 %
Neutro Abs: 3.6 10*3/uL (ref 1.7–7.7)
Neutrophils Relative %: 56 %
Platelets: 205 10*3/uL (ref 150–400)
RBC: 3.05 MIL/uL — ABNORMAL LOW (ref 3.87–5.11)
RDW: 13.8 % (ref 11.5–15.5)
WBC: 6.4 10*3/uL (ref 4.0–10.5)
nRBC: 0 % (ref 0.0–0.2)

## 2023-05-26 LAB — BASIC METABOLIC PANEL
Anion gap: 11 (ref 5–15)
BUN: 34 mg/dL — ABNORMAL HIGH (ref 8–23)
CO2: 19 mmol/L — ABNORMAL LOW (ref 22–32)
Calcium: 9.2 mg/dL (ref 8.9–10.3)
Chloride: 106 mmol/L (ref 98–111)
Creatinine, Ser: 1.5 mg/dL — ABNORMAL HIGH (ref 0.44–1.00)
GFR, Estimated: 32 mL/min — ABNORMAL LOW (ref >60)
Glucose, Bld: 161 mg/dL — ABNORMAL HIGH (ref 70–99)
Potassium: 3.5 mmol/L (ref 3.5–5.1)
Sodium: 136 mmol/L (ref 135–145)

## 2023-05-26 NOTE — Discharge Instructions (Addendum)
You have been evaluated for your symptoms.  Fortunately her blood pressure, although elevated, is not causing any acute congestion such as stroke or heart attack.  Please follow-up closely with your primary care doctor for recheck and likely readjustment of your blood pressure medication if indicated.  Return if you develop severe headache, chest pain, new numbness or new isolated weakness or if you have any concern.

## 2023-05-26 NOTE — ED Provider Triage Note (Signed)
Emergency Medicine Provider Triage Evaluation Note  TIERA MENSINGER , a 89 y.o. female  was evaluated in triage.  Pt complains of hypertension.  Patient states that she recently had her amlodipine dropped from 10 mg daily to 5 mg daily a week ago and notes her blood pressure was 200 today.  Patient denies chest pain shortness of breath vision changes headache or new onset weakness.  Review of Systems  Positive:  Negative:   Physical Exam  BP (!) 144/95 (BP Location: Left Arm)   Pulse (!) 109   Temp 98.5 F (36.9 C) (Oral)   Resp 16   Ht 5\' 7"  (1.702 m)   Wt 73 kg   SpO2 100%   BMI 25.21 kg/m  Gen:   Awake, no distress   Resp:  Normal effort  MSK:   Moves extremities without difficulty  Other:    Medical Decision Making  Medically screening exam initiated at 5:35 PM.  Appropriate orders placed.  [name] was informed that the remainder of the evaluation will be completed by another provider, this initial triage assessment does not replace that evaluation, and the importance of remaining in the ED until their evaluation is complete.  Workup initiated, patient stable at this time.  Do suspect patient may need to have her amlodipine increased back up to 10 mg.   Netta Corrigan, PA-C 05/26/23 1736

## 2023-05-26 NOTE — ED Provider Notes (Signed)
[place] [facility]GENCY DEPARTMENT AT [facility] Provider Note   CSN: [id number] Arrival date & time: 05/26/23  1717     History  Chief Complaint  Patient presents with   Hypertension    Wanda ACHEY is a 89 y.o. female.  The history is provided by the patient, a relative and medical records. No language interpreter was used.  Hypertension     89 year old female with history of hypertension currently on Norvasc, Hyzaar, presenting today with concerns of elevated blood pressure.  Patient report she was seen by her PCP for regular checkup several days ago and she noted that her blood pressure was elevated as high as 170.  Her doctor was aware, after waiting in the office for period of time, her blood pressure did improved.  Since then she has been checking her blood pressure and this morning she mention her blood pressure was greater than 200 systolic thus prompting this ER visit.  Patient endorsed having tingling sensation to her hands and feet as well as pain in her left knee ongoing for 2 years.  She endorsed a mild headache ongoing for 6 months.  She does not endorse any chest pain or trouble breathing denies any focal numbness or focal weakness denies any change in her diet or medication.  Home Medications Prior to Admission medications   Medication Sig Start Date End Date Taking? Authorizing Provider  acetaminophen (TYLENOL) 325 MG tablet Take 650 mg by mouth every 6 (six) hours as needed for mild pain.    [provider]  amLODipine (NORVASC) 10 MG tablet Take 1 tablet (10 mg total) by mouth daily. 12/02/22 12/02/23  Hassan Rowan, [place], MD  betamethasone valerate ointment (VALISONE) 0.1 % Apply 1 Application topically 2 (two) times daily. 08/01/22   Atway, Derwood Kaplan, DO  camphor-menthol (SARNA) lotion Apply 1 Application topically as needed for itching. 02/18/23   Rana Snare, DO  cholecalciferol (VITAMIN D3) 25 MCG (1000 UNIT) tablet TAKE 1 TABLET BY MOUTH  EVERY DAY 05/21/23   Rana Snare, DO  cyclobenzaprine (FLEXERIL) 5 MG tablet TAKE 1 TABLET (5 MG) BY MOUTH EVERY OTHER DAY AS NEEDED FOR MUSCLE SPASMS 05/20/23   Modena Slater, DO  diclofenac Sodium (VOLTAREN) 1 % GEL Apply 2 g topically 4 (four) times daily. 12/16/22   Alexander-Savino, [place], MD  diphenhydrAMINE-zinc acetate (BENADRYL ITCH STOPPING) cream Apply topically 3 (three) times daily as needed for itching. 08/01/22   Atway, Rayann N, DO  gabapentin (NEURONTIN) 400 MG capsule Take 1 capsule (400 mg total) by mouth 2 (two) times daily. 05/06/23   Rana Snare, DO  losartan-hydrochlorothiazide (HYZAAR) 100-25 MG tablet Take 1 tablet by mouth daily. 05/20/23   Modena Slater, DO  metFORMIN (GLUCOPHAGE) 500 MG tablet TAKE 1 TABLET BY MOUTH EVERY DAY WITH BREAKFAST 08/04/22   Rana Snare, DO  omeprazole (PRILOSEC) 20 MG capsule Take 1 capsule (20 mg total) by mouth daily. 12/16/22   Manuela Neptune, MD      Allergies    Lisinopril    Review of Systems   Review of Systems  All other systems reviewed and are negative.   Physical Exam Updated Vital Signs BP (!) 144/95 (BP Location: Left Arm)   Pulse (!) 109   Temp 98.5 F (36.9 C) (Oral)   Resp 16   Ht 5\' 7"  (1.702 m)   Wt 73 kg   SpO2 100%   BMI 25.21 kg/m  Physical Exam Vitals and nursing note reviewed.  Constitutional:  General: She is not in acute distress.    Appearance: She is well-developed.  HENT:     Head: Atraumatic.  Eyes:     Extraocular Movements: Extraocular movements intact.     Conjunctiva/sclera: Conjunctivae normal.     Pupils: Pupils are equal, round, and reactive to light.  Cardiovascular:     Rate and Rhythm: Tachycardia present.  Pulmonary:     Effort: Pulmonary effort is normal.     Breath sounds: No wheezing, rhonchi or rales.  Abdominal:     Palpations: Abdomen is soft.     Tenderness: There is no abdominal tenderness.  Musculoskeletal:     Cervical back: Normal range of motion  and neck supple.     Comments: 5 out of 5 to all 4 extremities  Skin:    Findings: No rash.  Neurological:     Mental Status: She is alert.     GCS: GCS eye subscore is 4. GCS verbal subscore is 5. GCS motor subscore is 6.     Cranial Nerves: No cranial nerve deficit, dysarthria or facial asymmetry.     Sensory: Sensation is intact.     Motor: Motor function is intact.  Psychiatric:        Mood and Affect: Mood normal.     ED Results / Procedures / Treatments   Labs (all labs ordered are listed, but only abnormal results are displayed) Labs Reviewed  BASIC METABOLIC PANEL  CBC WITH DIFFERENTIAL/PLATELET    EKG None  Radiology No results found.  Procedures Procedures    Medications Ordered in ED Medications - No data to display  ED Course/ Medical Decision Making/ A&P                                 Medical Decision Making  BP (!) 154/71   Pulse 95   Temp 98.5 F (36.9 C) (Oral)   Resp 16   Ht 5\' 7"  (1.702 m)   Wt 73 kg   SpO2 94%   BMI 25.21 kg/m   50:69 PM 89 year old female with history of hypertension currently on Norvasc, Hyzaar, presenting today with concerns of elevated blood pressure.  Patient report she was seen by her PCP for regular checkup several days ago and she noted that her blood pressure was elevated as high as 170.  Her doctor was aware, after waiting in the office for period of time, her blood pressure did improved.  Since then she has been checking her blood pressure and this morning she mention her blood pressure was greater than 200 systolic thus prompting this ER visit.  Patient endorsed having tingling sensation to her hands and feet as well as pain in her left knee ongoing for 2 years.  She endorsed a mild headache ongoing for 6 months.  She does not endorse any chest pain or trouble breathing denies any focal numbness or focal weakness denies any change in her diet or medication.  Exam overall reassuring, she has normal strength and  sensation throughout all 4 extremities.  She does have trace edema to bilateral lower extremities but this is likely chronic.  No calf tenderness to suggest DVT.  Patient without any focal neurodeficit concerning for acute stroke.  No complaint of chest pain to suggest hypertensive emergency causing cardiac history.  Vital signs currently showing a blood pressure of 144/95.  She is mildly tachycardic but without any symptom concerning for PE.  Labs obtained, interpreted by me and overall reassuring.  Hemoglobin is 10 similar to baseline.  Creatinine is 1.5 similar to baseline.  Chest x-ray independently viewed interpreted by me without any concerning finding.  At this time patient is stable for discharge.  No evidence to suggest hypertensive emergency.  Care discussed with Dr. Rhunette Croft.  Social determinant of health including social isolation, and lack of physical activity.         Final Clinical Impression(s) / ED Diagnoses Final diagnoses:  Asymptomatic hypertension    Rx / DC Orders ED Discharge Orders     None         Fayrene Helper, PA-C 05/26/23 1820    Derwood Kaplan, MD 05/27/23 [phone]

## 2023-05-26 NOTE — ED Triage Notes (Signed)
Patient report high blood pressure at home (200/100). Patient denies N/V. Patient report taking BP medication regularly.

## 2023-05-29 NOTE — Progress Notes (Signed)
Internal Medicine Clinic Attending  Case discussed with the resident at the time of the visit.  We reviewed the resident's history and exam and pertinent patient test results.  I agree with the assessment, diagnosis, and plan of care documented in the resident's note.  sTage 3b CKD may not necessarily need to be referred to nephrology; if it remains stable, she will not develop symptoms in her remaining lifetime.  Nephrology referral would assume (after patient/family discussion) that renal replacement therapy would be desired if she developed symptomatic ESRD.

## 2023-07-02 ENCOUNTER — Ambulatory Visit: Payer: 59

## 2023-07-02 VITALS — Ht 67.0 in | Wt 160.0 lb

## 2023-07-02 DIAGNOSIS — Z0000 Encounter for general adult medical examination without abnormal findings: Secondary | ICD-10-CM

## 2023-07-02 NOTE — Patient Instructions (Signed)
 [name] , Thank you for taking time to come for your Medicare Wellness Visit. I appreciate your ongoing commitment to your health goals. Please review the following plan we discussed and let me know if I can assist you in the future.   Referrals/Orders/Follow-Ups/Clinician Recommendations: Yes; Keep maintaining your health by keeping your appointments with Dr. Sherrilee Gilles and any specialists that you may see.  Call us if you need anything.  Have a great year!!!!  This is a list of the screening recommended for you and due dates:  Health Maintenance  Topic Date Due   COVID-19 Vaccine (3 - 2024-25 season) 12/29/2022   Eye exam for diabetics  05/22/2023   Complete foot exam   08/01/2023   Hemoglobin A1C  11/17/2023   Medicare Annual Wellness Visit  07/01/2024   DTaP/Tdap/Td vaccine (3 - Td or Tdap) 07/11/2029   Pneumonia Vaccine  Completed   Flu Shot  Completed   DEXA scan (bone density measurement)  Completed   Zoster (Shingles) Vaccine  Completed   HPV Vaccine  Aged Out   Lipid (cholesterol) test  Discontinued    Advanced directives: (Declined) Advance directive discussed with you today. Even though you declined this today, please call our office should you change your mind, and we can give you the proper paperwork for you to fill out.  Next Medicare Annual Wellness Visit scheduled for next year: Yes

## 2023-07-02 NOTE — Progress Notes (Signed)
 Subjective:   Wanda Mclaughlin is a 89 y.o. who presents for a Medicare Wellness preventive visit.  Visit Complete: Virtual I connected with  [name] on 07/02/23 by a audio enabled telemedicine application and verified that I am speaking with the correct person using two identifiers.  Patient Location: Home  Provider Location: Office/Clinic  I discussed the limitations of evaluation and management by telemedicine. The patient expressed understanding and agreed to proceed.  Vital Signs: Because this visit was a virtual/telehealth visit, some criteria may be missing or patient reported. Any vitals not documented were not able to be obtained and vitals that have been documented are patient reported.  VideoDeclined- This patient declined Librarian, academic. Therefore the visit was completed with audio only.  AWV Questionnaire: No: Patient Medicare AWV questionnaire was not completed prior to this visit.  Cardiac Risk Factors include: advanced age (>58men, >69 women);hypertension;sedentary lifestyle;diabetes mellitus     Objective:    Today's Vitals   07/02/23 1004  Weight: 160 lb (72.6 kg)  Height: 5\' 7"  (1.702 m)  PainSc: 9   PainLoc: Generalized   Body mass index is 25.06 kg/m.     07/02/2023   10:07 AM 02/18/2023    9:33 AM 12/16/2022   10:00 AM 12/02/2022    2:10 PM 08/01/2022    9:21 AM 05/02/2022   11:53 AM 05/02/2022   10:02 AM  Advanced Directives  Does Patient Have a Medical Advance Directive? No No No No No No No  Would patient like information on creating a medical advance directive? No - Patient declined No - Patient declined No - Patient declined No - Patient declined No - Patient declined No - Patient declined No - Patient declined    Current Medications (verified) Outpatient Encounter Medications as of 07/02/2023  Medication Sig   acetaminophen (TYLENOL) 325 MG tablet Take 650 mg by mouth every 6 (six) hours as needed for mild pain.    amLODipine (NORVASC) 10 MG tablet Take 1 tablet (10 mg total) by mouth daily.   betamethasone valerate ointment (VALISONE) 0.1 % Apply 1 Application topically 2 (two) times daily.   camphor-menthol (SARNA) lotion Apply 1 Application topically as needed for itching.   cholecalciferol (VITAMIN D3) 25 MCG (1000 UNIT) tablet TAKE 1 TABLET BY MOUTH EVERY DAY   cyclobenzaprine (FLEXERIL) 5 MG tablet TAKE 1 TABLET (5 MG) BY MOUTH EVERY OTHER DAY AS NEEDED FOR MUSCLE SPASMS   diclofenac Sodium (VOLTAREN) 1 % GEL Apply 2 g topically 4 (four) times daily.   diphenhydrAMINE-zinc acetate (BENADRYL ITCH STOPPING) cream Apply topically 3 (three) times daily as needed for itching.   gabapentin (NEURONTIN) 400 MG capsule Take 1 capsule (400 mg total) by mouth 2 (two) times daily.   losartan-hydrochlorothiazide (HYZAAR) 100-25 MG tablet Take 1 tablet by mouth daily.   metFORMIN (GLUCOPHAGE) 500 MG tablet TAKE 1 TABLET BY MOUTH EVERY DAY WITH BREAKFAST   omeprazole (PRILOSEC) 20 MG capsule Take 1 capsule (20 mg total) by mouth daily.   No facility-administered encounter medications on file as of 07/02/2023.    Allergies (verified) Lisinopril   History: Past Medical History:  Diagnosis Date   Anemia    Normal EGG, colonoscopy, and Sm. Bowel F/T 2006   Bereavement 08/18/2020   Bilateral hand pain 07/09/2016   Chronic knee pain    Chronic renal failure    Degenerative joint disease    Diabetes mellitus 11/05   type II   Diabetic peripheral  neuropathy (HCC)    Duodenitis    Gastritis    Health care maintenance 03/15/2013   Health maintenance examination    Mammogram 10/11: Possible mass, right breast. No evidence of malignancy in left breast. Needle biopsy of left breast 12/11: No evidence of  carcinoma. Patient needs follow-up right breast diagnostic mammogram with ultrasound in 6 months.   History of diabetic gastroparesis 11/28/2017   Hyperlipidemia    Hypertension    Left knee pain 10/06/2017    Osteoporosis 09/2009   T score -2.8   Psychophysiological insomnia 11/15/2016   Vertigo    Past Surgical History:  Procedure Laterality Date    Right needle localized lumpectomy.   12/11   Path report:FOCAL ATYPICAL DUCTAL HYPERPLASIA IN A BACKGROUND OF STROMAL  FIBROSIS. No evidence of carcinoma.    ABDOMINAL HYSTERECTOMY     1980's (Fibroids)   COLONOSCOPY     8/11 by Dr. Christella Hartigan: Normal colon, Given your age, you will not need another colonoscopy for colon cancer screening or polyp surveillance   Family History  Problem Relation Age of Onset   Diabetes Sister    Social History   Socioeconomic History   Marital status: Widowed    Spouse name: Not on file   Number of children: 5   Years of education: 10th grade   Highest education level: Not on file  Occupational History   Occupation: Multimedia programmer: RETIRED    Comment: now retired  Tobacco Use   Smoking status: Never   Smokeless tobacco: Never  Substance and Sexual Activity   Alcohol use: No    Alcohol/week: 0.0 standard drinks of alcohol   Drug use: No   Sexual activity: Not on file  Other Topics Concern   Not on file  Social History Narrative   Lives by herself.   Daughter lives near by and help her with groceries.   Given diabetes card 05/03/2010   Social Drivers of Health   Financial Resource Strain: Low Risk  (07/02/2023)   Overall Financial Resource Strain (CARDIA)    Difficulty of Paying Living Expenses: Not hard at all  Food Insecurity: No Food Insecurity (07/02/2023)   Hunger Vital Sign    Worried About Running Out of Food in the Last Year: Never true    Ran Out of Food in the Last Year: Never true  Transportation Needs: No Transportation Needs (07/02/2023)   PRAPARE - Administrator, Civil Service (Medical): No    Lack of Transportation (Non-Medical): No  Physical Activity: Inactive (07/02/2023)   Exercise Vital Sign    Days of Exercise per Week: 0 days    Minutes of Exercise per Session: 0 min   Stress: No Stress Concern Present (07/02/2023)   [employer] of Occupational Health - Occupational Stress Questionnaire    Feeling of Stress : Not at all  Social Connections: Socially Isolated (07/02/2023)   Social Connection and Isolation Panel [NHANES]    Frequency of Communication with Friends and Family: More than three times a week    Frequency of Social Gatherings with Friends and Family: More than three times a week    Attends Religious Services: Never    Database administrator or Organizations: No    Attends Banker Meetings: Never    Marital Status: Widowed    Tobacco Counseling Counseling given: Not Answered    Clinical Intake:  Pre-visit preparation completed: Yes  Pain : 0-10 Pain Score: 9  (due  to arthritis) Pain Type: Chronic pain Pain Location: Generalized Pain Orientation: Right, Left, Lower (arms, hands, legs, lower back) Pain Descriptors / Indicators: Discomfort, Aching, Dull, Throbbing Pain Onset: More than a month ago Pain Frequency: Constant     BMI - recorded: 25.06 Nutritional Status: BMI 25 -29 Overweight Nutritional Risks: None Diabetes: Yes CBG done?: No Did pt. bring in CBG monitor from home?: No  How often do you need to have someone help you when you read instructions, pamphlets, or other written materials from your doctor or pharmacy?: 1 - Never What is the last grade level you completed in school?: 10th Grade  Interpreter Needed?: No  Information entered by :: Susie Cassette, LPN.   Activities of Daily Living     07/02/2023   10:09 AM 02/18/2023   12:21 PM  In your present state of health, do you have any difficulty performing the following activities:  Hearing? 0 1  Comment  patient has bilateral hearing aids  Vision? 0 0  Difficulty concentrating or making decisions? 0 0  Walking or climbing stairs? 1 1  Dressing or bathing? 0 0  Doing errands, shopping? 0 0  Preparing Food and eating ? N   Using the  Toilet? N   In the past six months, have you accidently leaked urine? N   Do you have problems with loss of bowel control? N   Managing your Medications? N   Managing your Finances? N   Housekeeping or managing your Housekeeping? N     Patient Care Team: Rana Snare, DO as PCP - General Antonietta Barcelona, OD as Consulting Physician (Optometry)  Indicate any recent Medical Services you may have received from other than [facility] providers in the past year (date may be approximate).     Assessment:   This is a routine wellness examination for [facility].  Hearing/Vision screen Hearing Screening - Comments:: Patient has hearing difficulties. Wears hearing aids.   Vision Screening - Comments:: Wears rx glasses - up to date with routine eye exams with Blima Ledger, OD.    Goals Addressed             This Visit's Progress    Client understands the importance of follow-up with providers by attending scheduled visits         Depression Screen     07/02/2023   10:10 AM 02/18/2023   12:21 PM 12/16/2022   10:00 AM 08/01/2022    9:21 AM 05/02/2022   10:02 AM 01/28/2022   10:31 AM 10/16/2021   11:05 AM  PHQ 2/9 Scores  PHQ - 2 Score 0 0 0 0 1 0 0  PHQ- 9 Score 0   1 2 1    Exception Documentation Other- indicate reason in comment box        Not completed Patient's daughter assited with virtual visit.  Patient was able to answer all the questions.          Fall Risk     07/02/2023   10:08 AM 02/18/2023   12:21 PM 12/16/2022   10:00 AM 08/01/2022    9:21 AM 05/02/2022   11:51 AM  Fall Risk   Falls in the past year? 0 0 0 0 0  Number falls in past yr: 0 0 0  0  Injury with Fall? 0 0 0  0  Risk for fall due to : No Fall Risks  No Fall Risks Impaired balance/gait No Fall Risks  Risk for fall due to: Comment  BALANCE AT TIMES   Follow up Falls prevention discussed;Falls evaluation completed Falls evaluation completed Falls evaluation completed;Falls prevention discussed Falls evaluation  completed Falls evaluation completed;Falls prevention discussed    MEDICARE RISK AT HOME:  Medicare Risk at Home Any stairs in or around the home?: Yes (3 steps on front porch; there is a handrailing) If so, are there any without handrails?: No Home free of loose throw rugs in walkways, pet beds, electrical cords, etc?: Yes Adequate lighting in your home to reduce risk of falls?: Yes Life alert?: No Use of a cane, walker or w/c?: Yes Grab bars in the bathroom?: No Shower chair or bench in shower?: No Elevated toilet seat or a handicapped toilet?: Yes  TIMED UP AND GO:  Was the test performed?  No  Cognitive Function: Patient has current diagnosis of cognitive impairment.. Patient is unable to complete screening 6CIT or MMSE.      07/02/2023   10:23 AM  MMSE - Mini Mental State Exam  Not completed: Unable to complete        05/02/2022   11:51 AM  6CIT Screen  What Year? 4 points  What month? 3 points  Count back from 20 2 points  Months in reverse 4 points  Repeat phrase 4 points    Immunizations Immunization History  Administered Date(s) Administered   Fluad Quad(high Dose 65+) 02/07/2021, 01/28/2022   Fluad Trivalent(High Dose 65+) 05/20/2023   Influenza Split 02/06/2011   Influenza Whole 06/13/2009   Influenza,inj,Quad PF,6+ Mos 01/14/2014, 02/03/2015, 12/26/2015, 01/25/2020   Influenza-Unspecified 02/17/2018, 01/28/2019   PFIZER(Purple Top)SARS-COV-2 Vaccination 10/29/2019, 11/19/2019   Pneumococcal Conjugate-13 07/04/2014   Pneumococcal Polysaccharide-23 11/28/2008   Td 11/28/2008   Tdap 07/12/2019   Zoster Recombinant(Shingrix) 10/06/2022, 12/08/2022    Screening Tests Health Maintenance  Topic Date Due   COVID-19 Vaccine (3 - 2024-25 season) 12/29/2022   OPHTHALMOLOGY EXAM  05/22/2023   FOOT EXAM  08/01/2023   HEMOGLOBIN A1C  11/17/2023   Medicare Annual Wellness (AWV)  07/01/2024   DTaP/Tdap/Td (3 - Td or Tdap) 07/11/2029   Pneumonia Vaccine 62+  Years old  Completed   INFLUENZA VACCINE  Completed   DEXA SCAN  Completed   Zoster Vaccines- Shingrix  Completed   HPV VACCINES  Aged Out   LIPID PANEL  Discontinued    Health Maintenance  Health Maintenance Due  Topic Date Due   COVID-19 Vaccine (3 - 2024-25 season) 12/29/2022   OPHTHALMOLOGY EXAM  05/22/2023   Health Maintenance Items Addressed: Yes, Patient aware that she is overdue for Covid vaccine and diabetic eye exam.  Additional Screening:  Vision Screening: Recommended annual ophthalmology exams for early detection of glaucoma and other disorders of the eye.  Dental Screening: Recommended annual dental exams for proper oral hygiene  Community Resource Referral / Chronic Care Management: CRR required this visit?  No   CCM required this visit?  No     Plan:     I have personally reviewed and noted the following in the patient's chart:   Medical and social history Use of alcohol, tobacco or illicit drugs  Current medications and supplements including opioid prescriptions. Patient is not currently taking opioid prescriptions. Functional ability and status Nutritional status Physical activity Advanced directives List of other physicians Hospitalizations, surgeries, and ER visits in previous 12 months Vitals Screenings to include cognitive, depression, and falls Referrals and appointments  In addition, I have reviewed and discussed with patient certain preventive protocols, quality metrics, and best practice  recommendations. A written personalized care plan for preventive services as well as general preventive health recommendations were provided to patient.     Mickeal Needy, LPN   05/04/1094   After Visit Summary: (Declined) Due to this being a telephonic visit, with patients personalized plan was offered to patient but patient Declined AVS at this time   Notes: Please refer to Routing Comments.

## 2023-07-24 ENCOUNTER — Other Ambulatory Visit: Payer: Self-pay | Admitting: Student

## 2023-07-24 DIAGNOSIS — E1140 Type 2 diabetes mellitus with diabetic neuropathy, unspecified: Secondary | ICD-10-CM

## 2023-07-24 NOTE — Telephone Encounter (Signed)
 Medication sent to pharmacy

## 2023-08-07 ENCOUNTER — Other Ambulatory Visit: Payer: Self-pay | Admitting: Student

## 2023-08-07 DIAGNOSIS — M62830 Muscle spasm of back: Secondary | ICD-10-CM

## 2023-08-13 ENCOUNTER — Ambulatory Visit: Payer: Self-pay | Admitting: Student

## 2023-08-13 ENCOUNTER — Encounter: Payer: Self-pay | Admitting: Student

## 2023-08-13 VITALS — BP 162/88 | HR 95 | Temp 98.2°F | Ht 67.0 in | Wt 152.7 lb

## 2023-08-13 DIAGNOSIS — E1140 Type 2 diabetes mellitus with diabetic neuropathy, unspecified: Secondary | ICD-10-CM

## 2023-08-13 DIAGNOSIS — G3184 Mild cognitive impairment, so stated: Secondary | ICD-10-CM

## 2023-08-13 DIAGNOSIS — M1180 Other specified crystal arthropathies, unspecified site: Secondary | ICD-10-CM

## 2023-08-13 DIAGNOSIS — Z0000 Encounter for general adult medical examination without abnormal findings: Secondary | ICD-10-CM

## 2023-08-13 DIAGNOSIS — Z7984 Long term (current) use of oral hypoglycemic drugs: Secondary | ICD-10-CM

## 2023-08-13 DIAGNOSIS — I10 Essential (primary) hypertension: Secondary | ICD-10-CM

## 2023-08-13 DIAGNOSIS — M62830 Muscle spasm of back: Secondary | ICD-10-CM

## 2023-08-13 DIAGNOSIS — M170 Bilateral primary osteoarthritis of knee: Secondary | ICD-10-CM | POA: Diagnosis not present

## 2023-08-13 DIAGNOSIS — M65352 Trigger finger, left little finger: Secondary | ICD-10-CM

## 2023-08-13 MED ORDER — SARNA 0.5-0.5 % EX LOTN: 1.0000 | TOPICAL_LOTION | CUTANEOUS | 1 refills | Status: AC | PRN

## 2023-08-13 MED ORDER — DICLOFENAC SODIUM 1 % EX GEL: 2.0000 g | Freq: Four times a day (QID) | CUTANEOUS | 3 refills | Status: AC

## 2023-08-13 MED ORDER — GABAPENTIN 400 MG PO CAPS: 400.0000 mg | ORAL_CAPSULE | Freq: Every day | ORAL | 3 refills | Status: DC

## 2023-08-13 MED ORDER — LOSARTAN POTASSIUM-HCTZ 100-25 MG PO TABS: 1.0000 | ORAL_TABLET | Freq: Every day | ORAL | 3 refills | Status: DC

## 2023-08-13 NOTE — Assessment & Plan Note (Deleted)
 Ophthalmology appt on 4/29 per patient.

## 2023-08-13 NOTE — Patient Instructions (Addendum)
 Thank you, Wanda Mclaughlin for allowing us  to provide your care today. Today we discussed   -Blood pressure still high -Check your blood pressure at home after taking your blood pressure medicines (2-3 times a week) --- Record the readings and bring in at next visit -Will work on referral for home aide services if that is available for you   -Will call pharmacy about your medicine refills  Wanda Mclaughlin Address: [address] [place], [place], [place] [address] Phone: [phone]  Follow up:  2 week for blood pressure check with nurse    Should you have any questions or concerns please call the internal medicine clinic at [phone].    Wanda Mclaughlin, D.O. [facility]

## 2023-08-13 NOTE — Assessment & Plan Note (Signed)
 BP elevated on repeat at 162/88. Did not take her BP meds today. On amlodipine 10 mg and losartan-hydrochlorothiazide 100-25 mg. Denies any hypertensive symptoms at this time. Reports adherence to meds. Last BMP with normal electrolytes and stable CKD.   Plan -Continue amlodipine and losartan-hydrochlorothiazide  -2 week RN visit for BP check -If BP remains elevated on current regimen, add on spironolactone

## 2023-08-13 NOTE — Assessment & Plan Note (Addendum)
 Well controlled, last A1c 6.2 on metformin. No acute concerns.   Plan -Continue metformin 500 mg daily  -Refilled gabapentin 400 mg at bedtime (renally dosed) -Ophthalmology appt on 4/29 per patient.

## 2023-08-13 NOTE — Assessment & Plan Note (Signed)
 Hx of bilateral knee OA. Makes bathing difficult at times. OA has impacted her completing her ADLs. Patient and daughter requests option of home aide to assist with daily chores and some ADLs.   Plan -Referral to [facility] Mgmt

## 2023-08-13 NOTE — Progress Notes (Signed)
 CC: routine f/u  HPI:  Wanda Mclaughlin is a 89 y.o. female living with a history stated below and presents today for routine f/u. Please see problem based assessment and plan for additional details.  Past Medical History:  Diagnosis Date   Anemia    Normal EGG, colonoscopy, and Sm. Bowel F/T 2006   Bereavement 08/18/2020   Bilateral hand pain 07/09/2016   Chronic knee pain    Chronic renal failure    Degenerative joint disease    Diabetes mellitus 11/05   type II   Diabetic peripheral neuropathy (HCC)    Duodenitis    Gastritis    Health care maintenance 03/15/2013   Health maintenance examination    Mammogram 10/11: Possible mass, right breast. No evidence of malignancy in left breast. Needle biopsy of left breast 12/11: No evidence of  carcinoma. Patient needs follow-up right breast diagnostic mammogram with ultrasound in 6 months.   History of diabetic gastroparesis 11/28/2017   Hyperlipidemia    Hypertension    Left knee pain 10/06/2017   Osteoporosis 09/2009   T score -2.8   Psychophysiological insomnia 11/15/2016   Vertigo     Current Outpatient Medications on File Prior to Visit  Medication Sig Dispense Refill   acetaminophen (TYLENOL) 325 MG tablet Take 650 mg by mouth every 6 (six) hours as needed for mild pain.     amLODipine (NORVASC) 10 MG tablet Take 1 tablet (10 mg total) by mouth daily. 30 tablet 11   betamethasone valerate ointment (VALISONE) 0.1 % Apply 1 Application topically 2 (two) times daily. 30 g 0   camphor-menthol (SARNA) lotion Apply 1 Application topically as needed for itching. 222 mL 0   cholecalciferol (VITAMIN D3) 25 MCG (1000 UNIT) tablet TAKE 1 TABLET BY MOUTH EVERY DAY 90 tablet 3   cyclobenzaprine (FLEXERIL) 5 MG tablet TAKE 1 TABLET (5 MG) BY MOUTH EVERY OTHER DAY AS NEEDED FOR MUSCLE SPASMS 30 tablet 0   diclofenac Sodium (VOLTAREN) 1 % GEL Apply 2 g topically 4 (four) times daily. 100 g 1   diphenhydrAMINE-zinc acetate (BENADRYL ITCH  STOPPING) cream Apply topically 3 (three) times daily as needed for itching. 28.3 g 0   gabapentin (NEURONTIN) 400 MG capsule Take 1 capsule (400 mg total) by mouth 2 (two) times daily. 180 capsule 1   losartan-hydrochlorothiazide (HYZAAR) 100-25 MG tablet Take 1 tablet by mouth daily. 90 tablet 1   metFORMIN (GLUCOPHAGE) 500 MG tablet TAKE 1 TABLET BY MOUTH EVERY DAY WITH BREAKFAST 90 tablet 3   omeprazole (PRILOSEC) 20 MG capsule Take 1 capsule (20 mg total) by mouth daily. 90 capsule 3   No current facility-administered medications on file prior to visit.    Family History  Problem Relation Age of Onset   Diabetes Sister     Social History   Socioeconomic History   Marital status: Widowed    Spouse name: Not on file   Number of children: 5   Years of education: 10th grade   Highest education level: Not on file  Occupational History   Occupation: Multimedia programmer: RETIRED    Comment: now retired  Tobacco Use   Smoking status: Never   Smokeless tobacco: Never  Substance and Sexual Activity   Alcohol use: No    Alcohol/week: 0.0 standard drinks of alcohol   Drug use: No   Sexual activity: Not on file  Other Topics Concern   Not on file  Social History Narrative  Lives by herself.   Daughter lives near by and help her with groceries.   Given diabetes card 05/03/2010   Social Drivers of Health   Financial Resource Strain: Low Risk  (07/02/2023)   Overall Financial Resource Strain (CARDIA)    Difficulty of Paying Living Expenses: Not hard at all  Food Insecurity: No Food Insecurity (07/02/2023)   Hunger Vital Sign    Worried About Running Out of Food in the Last Year: Never true    Ran Out of Food in the Last Year: Never true  Transportation Needs: No Transportation Needs (07/02/2023)   PRAPARE - Administrator, Civil Service (Medical): No    Lack of Transportation (Non-Medical): No  Physical Activity: Inactive (07/02/2023)   Exercise Vital Sign    Days of  Exercise per Week: 0 days    Minutes of Exercise per Session: 0 min  Stress: No Stress Concern Present (07/02/2023)   [employer] of Occupational Health - Occupational Stress Questionnaire    Feeling of Stress : Not at all  Social Connections: Socially Isolated (07/02/2023)   Social Connection and Isolation Panel [NHANES]    Frequency of Communication with Friends and Family: More than three times a week    Frequency of Social Gatherings with Friends and Family: More than three times a week    Attends Religious Services: Never    Database administrator or Organizations: No    Attends Banker Meetings: Never    Marital Status: Widowed  Intimate Partner Violence: Not At Risk (07/02/2023)   Humiliation, Afraid, Rape, and Kick questionnaire    Fear of Current or Ex-Partner: No    Emotionally Abused: No    Physically Abused: No    Sexually Abused: No    Review of Systems: ROS negative except for what is noted on the assessment and plan.  Vitals:   08/13/23 0934 08/13/23 1026  BP: (!) 166/74 (!) 162/88  Pulse: 95   Temp: 98.2 F (36.8 C)   TempSrc: Oral   SpO2: 100%   Weight: 152 lb 11.2 oz (69.3 kg)   Height: 5\' 7"  (1.702 m)    Physical Exam: Constitutional: well-appearing female sitting in wheelchair, in no acute distress Cardiovascular: regular rate and rhythm Pulmonary/Chest: normal work of breathing on room air, lungs clear to auscultation bilaterally MSK: no LE edema bilaterally Neurological: alert & oriented x 3 Skin: warm and dry  Assessment & Plan:   Essential hypertension BP elevated on repeat at 162/88. Did not take her BP meds today. On amlodipine 10 mg and losartan-hydrochlorothiazide 100-25 mg. Denies any hypertensive symptoms at this time. Reports adherence to meds. Last BMP with normal electrolytes and stable CKD.   Plan -Continue amlodipine and losartan-hydrochlorothiazide  -2 week RN visit for BP check -If BP remains elevated on current  regimen, add on spironolactone   Osteoarthritis of bilateral knees Hx of bilateral knee OA. Makes bathing difficult at times. OA has impacted her completing her ADLs. Patient and daughter requests option of home aide to assist with daily chores and some ADLs.   Plan -Referral to [facility]   Diabetes mellitus with neuropathy (HCC) Well controlled, last A1c 6.2 on metformin. No acute concerns.   Plan -Continue metformin 500 mg daily  -Refilled gabapentin 400 mg at bedtime (renally dosed) -Ophthalmology appt on 4/29 per patient.  Healthcare maintenance Ophthalmology appt on 4/29 per patient.    Patient discussed with Dr. Machen  Sha Burling, D.O. [facility] Internal Medicine, PGY-2 Phone: [phone] Date 08/13/2023 Time 9:57 AM

## 2023-08-13 NOTE — Assessment & Plan Note (Signed)
 Ophthalmology appt on 4/29 per patient.

## 2023-08-14 NOTE — Progress Notes (Signed)
 Internal Medicine Clinic Attending  Case discussed with the resident at the time of the visit.  We reviewed the resident's history and exam and pertinent patient test results.  I agree with the assessment, diagnosis, and plan of care documented in the resident's note.

## 2023-08-18 ENCOUNTER — Other Ambulatory Visit: Payer: Self-pay | Admitting: Student

## 2023-08-18 DIAGNOSIS — M62830 Muscle spasm of back: Secondary | ICD-10-CM

## 2023-08-26 DIAGNOSIS — Z961 Presence of intraocular lens: Secondary | ICD-10-CM | POA: Diagnosis not present

## 2023-08-26 DIAGNOSIS — Z9849 Cataract extraction status, unspecified eye: Secondary | ICD-10-CM | POA: Diagnosis not present

## 2023-08-26 DIAGNOSIS — H53143 Visual discomfort, bilateral: Secondary | ICD-10-CM | POA: Diagnosis not present

## 2023-08-26 DIAGNOSIS — E119 Type 2 diabetes mellitus without complications: Secondary | ICD-10-CM | POA: Diagnosis not present

## 2023-08-27 ENCOUNTER — Ambulatory Visit: Admitting: *Deleted

## 2023-08-27 ENCOUNTER — Telehealth: Payer: Self-pay | Admitting: *Deleted

## 2023-08-27 NOTE — Progress Notes (Signed)
    Wanda Mclaughlin presented today for blood pressure check. Patient is prescribed blood pressure medications and I confirmed that patient did take their blood pressure medication prior to today's appointment. Blood pressure was taken in the usual and appropriate manner using an automated BP cuff.     Vitals:   08/27/23 0936  BP: (!) 140/65      Results of today's visit will be routed to Dr. Jari Merles for review and further management.    Repeat 140/65 P 77 @ 9:42 AM

## 2023-08-27 NOTE — Progress Notes (Signed)
 Complex Care Management Note Care Guide Note  08/27/2023 Name: Wanda Mclaughlin MRN: [id number] DOB: [birth year]   Complex Care Management Outreach Attempts: An unsuccessful telephone outreach was attempted today to offer the patient information about available complex care management services.  Follow Up Plan:  Additional outreach attempts will be made to offer the patient complex care management information and services.   Encounter Outcome:  No Answer  Barnie Bora  [facility]  [facility], [facility] Guide  Direct Dial: [phone]  Fax [phone]

## 2023-09-01 NOTE — Progress Notes (Signed)
 Complex Care Management Note Care Guide Note  09/01/2023 Name: NINEVEH OLDENKAMP MRN: [id number] DOB: 11-25-1928   Complex Care Management Outreach Attempts: A second unsuccessful outreach was attempted today to offer the patient with information about available complex care management services.  Follow Up Plan:  Additional outreach attempts will be made to offer the patient complex care management information and services.   Encounter Outcome:  No Answer  Barnie Bora  [facility]  [facility], [facility] Guide  Direct Dial: [phone]  Fax [phone]

## 2023-09-04 NOTE — Progress Notes (Signed)
 Complex Care Management Note Care Guide Note  09/04/2023 Name: Wanda Mclaughlin MRN: [id number] DOB: 1928-07-26   Complex Care Management Outreach Attempts: A third unsuccessful outreach was attempted today to offer the patient with information about available complex care management services.  Follow Up Plan:  No further outreach attempts will be made at this time. We have been unable to contact the patient to offer or enroll patient in complex care management services.  Encounter Outcome:  No Answer  Barnie Bora  [facility]  [facility], [facility] Guide  Direct Dial: [phone]  Fax [phone]

## 2023-09-16 ENCOUNTER — Other Ambulatory Visit: Payer: Self-pay | Admitting: Student

## 2023-09-16 DIAGNOSIS — M62830 Muscle spasm of back: Secondary | ICD-10-CM

## 2023-11-07 ENCOUNTER — Other Ambulatory Visit: Payer: Self-pay | Admitting: Student

## 2023-11-07 DIAGNOSIS — E1140 Type 2 diabetes mellitus with diabetic neuropathy, unspecified: Secondary | ICD-10-CM

## 2023-11-12 ENCOUNTER — Other Ambulatory Visit: Payer: Self-pay | Admitting: Student

## 2023-11-12 ENCOUNTER — Other Ambulatory Visit: Payer: Self-pay

## 2023-11-12 DIAGNOSIS — Z0000 Encounter for general adult medical examination without abnormal findings: Secondary | ICD-10-CM

## 2023-11-12 DIAGNOSIS — M62830 Muscle spasm of back: Secondary | ICD-10-CM

## 2023-11-12 MED ORDER — CYCLOBENZAPRINE HCL 5 MG PO TABS: ORAL_TABLET | ORAL | 0 refills | Status: DC

## 2023-11-12 MED ORDER — OMEPRAZOLE 20 MG PO CPDR: 20.0000 mg | DELAYED_RELEASE_CAPSULE | Freq: Every day | ORAL | 3 refills | Status: AC

## 2023-11-12 NOTE — Telephone Encounter (Signed)
 Medication sent to pharmacy

## 2023-11-12 NOTE — Telephone Encounter (Signed)
 Copied from CRM [id number]. Topic: Clinical - Medication Refill >> Nov 12, 2023 10:34 AM Adrianna P wrote: Medication: cyclobenzaprine  (FLEXERIL ) 5 MG tablet  Has the patient contacted their pharmacy? No (Agent: If no, request that the patient contact the pharmacy for the refill. If patient does not wish to contact the pharmacy document the reason why and proceed with request.) (Agent: If yes, when and what did the pharmacy advise?)  This is the patient's preferred pharmacy:  [id number] [place], [place] - [address] AT [address] [place] [address] Phone: [phone] Fax: [phone]  Is this the correct pharmacy for this prescription? Yes If no, delete pharmacy and type the correct one.   Has the prescription been filled recently? No  Is the patient out of the medication? Yes  Has the patient been seen for an appointment in the last year OR does the patient have an upcoming appointment? Yes  Can we respond through MyChart? Yes  Agent: Please be advised that Rx refills may take up to 3 business days. We ask that you follow-up with your pharmacy.

## 2023-11-13 ENCOUNTER — Other Ambulatory Visit: Payer: Self-pay | Admitting: Student

## 2023-11-13 DIAGNOSIS — E1140 Type 2 diabetes mellitus with diabetic neuropathy, unspecified: Secondary | ICD-10-CM

## 2023-11-13 NOTE — Telephone Encounter (Signed)
 Copied from CRM [id number]. Topic: Clinical - Medication Refill >> Nov 13, 2023  2:37 PM Carmell R wrote: Medication: gabapentin  (NEURONTIN ) 400 MG capsule  Has the patient contacted their pharmacy? Yes. Advised for pt to reach out to the doctor.  This is the patient's preferred pharmacy:  [id number] [place], [place] - [address] AT [address] [place] [address] Phone: [phone] Fax: [phone]  Is this the correct pharmacy for this prescription? Yes If no, delete pharmacy and type the correct one.   Has the prescription been filled recently? Yes  Is the patient out of the medication? Yes  Has the patient been seen for an appointment in the last year OR does the patient have an upcoming appointment? Yes  Can we respond through MyChart? No  Agent: Please be advised that Rx refills may take up to 3 business days. We ask that you follow-up with your pharmacy.

## 2023-11-17 ENCOUNTER — Other Ambulatory Visit: Payer: Self-pay

## 2023-11-17 ENCOUNTER — Other Ambulatory Visit: Payer: Self-pay | Admitting: Student

## 2023-11-17 ENCOUNTER — Telehealth: Payer: Self-pay | Admitting: *Deleted

## 2023-11-17 ENCOUNTER — Ambulatory Visit: Payer: Self-pay | Admitting: Student

## 2023-11-17 VITALS — BP 159/70 | HR 92 | Temp 98.2°F | Ht 67.0 in | Wt 151.6 lb

## 2023-11-17 DIAGNOSIS — E1122 Type 2 diabetes mellitus with diabetic chronic kidney disease: Secondary | ICD-10-CM | POA: Diagnosis not present

## 2023-11-17 DIAGNOSIS — I129 Hypertensive chronic kidney disease with stage 1 through stage 4 chronic kidney disease, or unspecified chronic kidney disease: Secondary | ICD-10-CM

## 2023-11-17 DIAGNOSIS — E1140 Type 2 diabetes mellitus with diabetic neuropathy, unspecified: Secondary | ICD-10-CM

## 2023-11-17 DIAGNOSIS — N1832 Chronic kidney disease, stage 3b: Secondary | ICD-10-CM | POA: Diagnosis not present

## 2023-11-17 DIAGNOSIS — I10 Essential (primary) hypertension: Secondary | ICD-10-CM

## 2023-11-17 DIAGNOSIS — Z7984 Long term (current) use of oral hypoglycemic drugs: Secondary | ICD-10-CM

## 2023-11-17 LAB — POCT GLYCOSYLATED HEMOGLOBIN (HGB A1C): Hemoglobin A1C: 5.8 % — AB (ref 4.0–5.6)

## 2023-11-17 LAB — GLUCOSE, CAPILLARY: Glucose-Capillary: 114 mg/dL — ABNORMAL HIGH (ref 70–99)

## 2023-11-17 MED ORDER — AMLODIPINE BESYLATE 10 MG PO TABS: 10.0000 mg | ORAL_TABLET | Freq: Every day | ORAL | 3 refills | Status: DC

## 2023-11-17 MED ORDER — GABAPENTIN 400 MG PO CAPS
400.0000 mg | ORAL_CAPSULE | Freq: Every day | ORAL | 3 refills | Status: DC
Start: 2023-11-17 — End: 2023-11-19

## 2023-11-17 NOTE — Progress Notes (Signed)
 CC: Routine office follow up   HPI:  [name] is a 89 y.o. female living with a history stated below and presents today for follow-up and to discuss her chronic conditions.. Please see problem based assessment and plan for additional details.  Past Medical History:  Diagnosis Date   Anemia    Normal EGG, colonoscopy, and Sm. Bowel F/T 2006   Bereavement 08/18/2020   Bilateral hand pain 07/09/2016   Chronic knee pain    Chronic renal failure    Degenerative joint disease    Diabetes mellitus 11/05   type II   Diabetic peripheral neuropathy (HCC)    Duodenitis    Gastritis    Health care maintenance 03/15/2013   Health maintenance examination    Mammogram 10/11: Possible mass, right breast. No evidence of malignancy in left breast. Needle biopsy of left breast 12/11: No evidence of  carcinoma. Patient needs follow-up right breast diagnostic mammogram with ultrasound in 6 months.   History of diabetic gastroparesis 11/28/2017   Hyperlipidemia    Hypertension    Left knee pain 10/06/2017   Osteoporosis 09/2009   T score -2.8   Psychophysiological insomnia 11/15/2016   Vertigo     Current Outpatient Medications on File Prior to Visit  Medication Sig Dispense Refill   acetaminophen  (TYLENOL ) 325 MG tablet Take 650 mg by mouth every 6 (six) hours as needed for mild pain.     betamethasone  valerate ointment (VALISONE ) 0.1 % Apply 1 Application topically 2 (two) times daily. 30 g 0   camphor-menthol (SARNA) lotion Apply 1 Application topically as needed for itching. 222 mL 1   cholecalciferol (VITAMIN D3) 25 MCG (1000 UNIT) tablet TAKE 1 TABLET BY MOUTH EVERY DAY 90 tablet 3   cyclobenzaprine  (FLEXERIL ) 5 MG tablet TAKE 1 TABLET (5 MG) BY MOUTH EVERY OTHER DAY AS NEEDED FOR MUSCLE SPASMS 15 tablet 0   diclofenac  Sodium (VOLTAREN ) 1 % GEL Apply 2 g topically 4 (four) times daily. 100 g 3   diphenhydrAMINE -zinc acetate (BENADRYL  ITCH STOPPING) cream Apply topically 3 (three) times  daily as needed for itching. 28.3 g 0   losartan -hydrochlorothiazide  (HYZAAR) 100-25 MG tablet Take 1 tablet by mouth daily. 90 tablet 3   metFORMIN  (GLUCOPHAGE ) 500 MG tablet TAKE 1 TABLET BY MOUTH EVERY DAY WITH BREAKFAST 90 tablet 3   omeprazole  (PRILOSEC) 20 MG capsule Take 1 capsule (20 mg total) by mouth daily. 90 capsule 3   No current facility-administered medications on file prior to visit.    Family History  Problem Relation Age of Onset   Diabetes Sister     Social History   Socioeconomic History   Marital status: Widowed    Spouse name: Not on file   Number of children: 5   Years of education: 10th grade   Highest education level: Not on file  Occupational History   Occupation: Multimedia programmer: RETIRED    Comment: now retired  Tobacco Use   Smoking status: Never   Smokeless tobacco: Never  Substance and Sexual Activity   Alcohol use: No    Alcohol/week: 0.0 standard drinks of alcohol   Drug use: No   Sexual activity: Not on file  Other Topics Concern   Not on file  Social History Narrative   Lives by herself.   Daughter lives near by and help her with groceries.   Given diabetes card 05/03/2010   Social Drivers of Health   Financial Resource Strain: Low  Risk  (07/02/2023)   Overall Financial Resource Strain (CARDIA)    Difficulty of Paying Living Expenses: Not hard at all  Food Insecurity: No Food Insecurity (07/02/2023)   Hunger Vital Sign    Worried About Running Out of Food in the Last Year: Never true    Ran Out of Food in the Last Year: Never true  Transportation Needs: No Transportation Needs (07/02/2023)   PRAPARE - Administrator, Civil Service (Medical): No    Lack of Transportation (Non-Medical): No  Physical Activity: Inactive (07/02/2023)   Exercise Vital Sign    Days of Exercise per Week: 0 days    Minutes of Exercise per Session: 0 min  Stress: No Stress Concern Present (07/02/2023)   [employer] of Occupational Health -  Occupational Stress Questionnaire    Feeling of Stress : Not at all  Social Connections: Socially Isolated (07/02/2023)   Social Connection and Isolation Panel    Frequency of Communication with Friends and Family: More than three times a week    Frequency of Social Gatherings with Friends and Family: More than three times a week    Attends Religious Services: Never    Database administrator or Organizations: No    Attends Banker Meetings: Never    Marital Status: Widowed  Intimate Partner Violence: Not At Risk (07/02/2023)   Humiliation, Afraid, Rape, and Kick questionnaire    Fear of Current or Ex-Partner: No    Emotionally Abused: No    Physically Abused: No    Sexually Abused: No    Review of Systems: ROS negative except for what is noted on the assessment and plan.  Vitals:   11/17/23 0825 11/17/23 0830  BP: (!) 156/78 (!) 159/70  Pulse: (!) 102 92  Temp: 98.2 F (36.8 C)   TempSrc: Oral   SpO2: 100%   Weight: 151 lb 9.6 oz (68.8 kg)   Height: 5' 7 (1.702 m)     Physical Exam: Constitutional: well-appearing woman, sitting in chair with a cane Cardiovascular: regular rate and rhythm Pulmonary/Chest: normal work of breathing on room air Psych: normal mood and behavior  Assessment & Plan:   Essential hypertension BP Readings from Last 3 Encounters:  11/17/23 (!) 159/70  08/27/23 (!) 140/65  08/13/23 (!) 162/88  [name] came in for a routine follow-up and to discuss her chronic conditions, including hypertension. She is currently being managed with Hyzaar and amlodipine . Today, her blood pressure in the office was elevated at 159/70. However, the patient reported that she ran out of amlodipine  and has not been taking it for the past week, which I suspect is contributing to the elevated blood pressure compared to her previous visit. I apologized for the medication lapse and have provided a 75-month refill. I also advised the patient to call us  within two  weeks before running out of her medications to avoid future interruptions. -Refill amlodipine  10 mg -Continue on Hyzaar  Diabetes mellitus with neuropathy (HCC) Lab Results  Component Value Date   HGBA1C 6.2 (A) 05/20/2023   HGBA1C 6.6 (A) 12/02/2022   HGBA1C 6.1 (A) 08/01/2022  This patient has a history of diabetes and is currently being managed with metformin . Fortunately, her hemoglobin A1c has been within the target range for the past year. Given this, I am questioning whether she still requires metformin , though maintaining adequate blood glucose control is important given her history of chronic kidney disease. She denies symptoms of polyuria or polydipsia  but still experiences peripheral neuropathy, for which she is taking gabapentin . I will recheck her hemoglobin A1c today in the office. If her levels remain within goal, we will discuss the possibility of discontinuing metformin  during her next visit, through a shared decision-making process. - Hemoglobin A1c -Continue metformin  500 mg daily  CKD stage 3b, GFR 30-44 ml/min [facility]) Ms. Norvin also has a history of CKD stage IIIb. However, she does not report any urinary symptoms and is currently asymptomatic. Her last BMP was relatively stable. I will recheck her BMP during this visit, and if the results are concerning, we will consider involving nephrology for further evaluation at that time. -BMP     Patient discussed with Dr. Karna Drue Grow, M.D [facility] Internal Medicine Phone: [phone] Date 11/17/2023 Time 9:11 AM

## 2023-11-17 NOTE — Assessment & Plan Note (Signed)
 BP Readings from Last 3 Encounters:  11/17/23 (!) 159/70  08/27/23 (!) 140/65  08/13/23 (!) 162/88  Wanda Mclaughlin came in for a routine follow-up and to discuss her chronic conditions, including hypertension. She is currently being managed with Hyzaar and amlodipine . Today, her blood pressure in the office was elevated at 159/70. However, the patient reported that she ran out of amlodipine  and has not been taking it for the past week, which I suspect is contributing to the elevated blood pressure compared to her previous visit. I apologized for the medication lapse and have provided a 55-month refill. I also advised the patient to call us  within two weeks before running out of her medications to avoid future interruptions. -Refill amlodipine  10 mg -Continue on Hyzaar

## 2023-11-17 NOTE — Patient Instructions (Addendum)
 Thank you, Wanda Mclaughlin for allowing us  to provide your care today. Today we discussed your general health. We will recheck your blood sugar today.   I am sorry you were out of your medications. That could the reason why your blood hilary was high today. I have refilled your Amlodipine  today with refills to last you for 6 months.   I have ordered the following labs for you:  Lab Orders         Basic metabolic panel with GFR         POC Hbg A1C       Tests ordered today:    Referrals ordered today:   Referral Orders  No referral(s) requested today     I have ordered the following medication/changed the following medications:   Stop the following medications: Medications Discontinued During This Encounter  Medication Reason   amLODipine  (NORVASC ) 10 MG tablet Reorder   gabapentin  (NEURONTIN ) 400 MG capsule Reorder     Start the following medications: Meds ordered this encounter  Medications   amLODipine  (NORVASC ) 10 MG tablet    Sig: Take 1 tablet (10 mg total) by mouth daily.    Dispense:  60 tablet    Refill:  3   gabapentin  (NEURONTIN ) 400 MG capsule    Sig: Take 1 capsule (400 mg total) by mouth at bedtime.    Dispense:  90 capsule    Refill:  3     Follow up: 4 months   Remember:   Should you have any questions or concerns please call the internal medicine clinic at [phone].   Drue Lisa Grow MD 11/17/2023, 8:57 AM   [facility]

## 2023-11-17 NOTE — Assessment & Plan Note (Signed)
 Wanda Mclaughlin also has a history of CKD stage IIIb. However, she does not report any urinary symptoms and is currently asymptomatic. Her last BMP was relatively stable. I will recheck her BMP during this visit, and if the results are concerning, we will consider involving nephrology for further evaluation at that time. -BMP

## 2023-11-17 NOTE — Assessment & Plan Note (Signed)
 Lab Results  Component Value Date   HGBA1C 6.2 (A) 05/20/2023   HGBA1C 6.6 (A) 12/02/2022   HGBA1C 6.1 (A) 08/01/2022  This patient has a history of diabetes and is currently being managed with metformin . Fortunately, her hemoglobin A1c has been within the target range for the past year. Given this, I am questioning whether she still requires metformin , though maintaining adequate blood glucose control is important given her history of chronic kidney disease. She denies symptoms of polyuria or polydipsia but still experiences peripheral neuropathy, for which she is taking gabapentin . I will recheck her hemoglobin A1c today in the office. If her levels remain within goal, we will discuss the possibility of discontinuing metformin  during her next visit, through a shared decision-making process. - Hemoglobin A1c -Continue metformin  500 mg daily

## 2023-11-17 NOTE — Telephone Encounter (Signed)
 RTC to patient's daughter asking about the Gabapentin .  States  the pharmacy has a prescription for 400 mg 1 time a day.  Patient was previously on Gabapentin  2 times a day.  Verified that new prescription is for 400 mg 1 time a day. Copied from CRM [id number]. Topic: Clinical - Prescription Issue >> Nov 17, 2023 11:12 AM Graeme ORN wrote: Reason for CRM: Patient daughter called. Not listed on DPR - no information provided. States she just left from an appt with patient and they are having trouble filling gabapentin . Would like to speak to someone about directions and Rx. Phone [phone]  Thank You

## 2023-11-18 LAB — BASIC METABOLIC PANEL WITH GFR
BUN/Creatinine Ratio: 14 (ref 12–28)
BUN: 25 mg/dL (ref 10–36)
CO2: 21 mmol/L (ref 20–29)
Calcium: 10.1 mg/dL (ref 8.7–10.3)
Chloride: 101 mmol/L (ref 96–106)
Creatinine, Ser: 1.75 mg/dL — ABNORMAL HIGH (ref 0.57–1.00)
Glucose: 122 mg/dL — ABNORMAL HIGH (ref 70–99)
Potassium: 3.9 mmol/L (ref 3.5–5.2)
Sodium: 138 mmol/L (ref 134–144)
eGFR: 27 mL/min/1.73 — ABNORMAL LOW (ref >59)

## 2023-11-19 MED ORDER — GABAPENTIN 400 MG PO CAPS: 400.0000 mg | ORAL_CAPSULE | Freq: Two times a day (BID) | ORAL | 3 refills | Status: DC

## 2023-11-19 NOTE — Addendum Note (Signed)
 Addended by: RENNE HOMANS on: 11/19/2023 11:13 AM   Modules accepted: Orders

## 2023-11-21 ENCOUNTER — Telehealth: Payer: Self-pay | Admitting: *Deleted

## 2023-11-21 ENCOUNTER — Other Ambulatory Visit: Payer: Self-pay | Admitting: Student

## 2023-11-21 DIAGNOSIS — E1140 Type 2 diabetes mellitus with diabetic neuropathy, unspecified: Secondary | ICD-10-CM

## 2023-11-21 MED ORDER — GABAPENTIN 300 MG PO CAPS: 300.0000 mg | ORAL_CAPSULE | Freq: Three times a day (TID) | ORAL | 3 refills | Status: DC

## 2023-11-21 NOTE — Progress Notes (Signed)
 Internal Medicine Clinic Attending  Case discussed with the resident at the time of the visit.  We reviewed the resident's history and exam and pertinent patient test results.  I agree with the assessment, diagnosis, and plan of care documented in the resident's note.

## 2023-11-21 NOTE — Telephone Encounter (Addendum)
 Call from patient's daughter stated did not get prescription for the Gabapentin .  Patient was instructed earlier in the week to only take 1 400 mg capsule 1 time a day.  Daughter also said that patient does not have an appetite .  Is drinking water and Gator Aid.  Daughter concerned.  Patient does not have any fevers or stomach upsets at this time.  Spoke to Dr. Amoako and asked how often is patient to take the Gabapentin .  Patient can only have a maximum of 700 mg.   Patient's daughter said that patient is out.  Call to Pharmacy stated that patient picked up the Gabapentin  400 mg # 90 on 11/02/2023 with the directions of 1 time a day.  Call to daughter stated did not pick up only got the Metformin .  Call to Pharmacy patient was shown to have gotten the Gabapentin  on 11/02/2023.  RTC to daughter.  Prescription  was found and Dr. Amoako will call to discuss dosing with the patient and her daughter.  Patient and daughter were informed that Dr. Amoako will call patient to discuss her decreased appetite today.  Daughter voiced understanding and will await call.  Can also reach daughter at [phone] if unable to reach patient at her home.

## 2023-11-21 NOTE — Progress Notes (Signed)
 Wanda Mclaughlin' basic metabolic panel  reveals a decline in glomerular filtration rate (GFR) from 33 to 27 mL/min, indicating worsening renal function. She reports significant symptomatic relief with gabapentin  400 mg twice daily. However, based on her current renal function, this dosage exceeds the recommended maximum daily dose of 700 mg for a GFR in this range. To balance efficacy and safety, and in accordance with renal dosing guidelines, I am adjusting her prescription to gabapentin  300 mg orally twice daily. This provides a total daily dose of 600 mg, which is within the recommended range. The patient is insistent on taking two pills per day, so she is instructed to take 300 mg in the morning and 300 mg at night. Renal function will continue to be monitored, and the dose will be reassessed as needed.  Drue Lisa Grow MD 11/21/2023, 11:52 AM

## 2023-11-24 NOTE — Telephone Encounter (Signed)
 Patient called she stated she missed a call from [facility] regarding her loss of appetite. Patient is requesting a call back to her daughter Jacquelyn Garrett @[phone]

## 2023-11-25 ENCOUNTER — Other Ambulatory Visit: Payer: Self-pay | Admitting: Student

## 2023-11-25 DIAGNOSIS — E1140 Type 2 diabetes mellitus with diabetic neuropathy, unspecified: Secondary | ICD-10-CM

## 2023-11-25 MED ORDER — GABAPENTIN 300 MG PO CAPS: 300.0000 mg | ORAL_CAPSULE | Freq: Two times a day (BID) | ORAL | 3 refills | Status: DC

## 2023-12-10 ENCOUNTER — Other Ambulatory Visit: Payer: Self-pay | Admitting: Student

## 2023-12-10 DIAGNOSIS — M62830 Muscle spasm of back: Secondary | ICD-10-CM

## 2024-02-04 ENCOUNTER — Other Ambulatory Visit: Payer: Self-pay

## 2024-02-04 DIAGNOSIS — I10 Essential (primary) hypertension: Secondary | ICD-10-CM

## 2024-02-04 DIAGNOSIS — E1140 Type 2 diabetes mellitus with diabetic neuropathy, unspecified: Secondary | ICD-10-CM

## 2024-02-04 MED ORDER — AMLODIPINE BESYLATE 10 MG PO TABS: 10.0000 mg | ORAL_TABLET | Freq: Every day | ORAL | 3 refills | Status: DC

## 2024-02-04 NOTE — Telephone Encounter (Signed)
 Medication sent to pharmacy

## 2024-02-05 MED ORDER — GABAPENTIN 300 MG PO CAPS: 300.0000 mg | ORAL_CAPSULE | Freq: Two times a day (BID) | ORAL | 3 refills | Status: AC

## 2024-02-05 NOTE — Telephone Encounter (Signed)
Which dose?

## 2024-02-05 NOTE — Telephone Encounter (Signed)
 Thank you for clarifying.

## 2024-02-05 NOTE — Telephone Encounter (Signed)
 Rx refilled in error. Can someone confirm if the patient supposed to be taking Amlodipine  5 mg or 10 mg?

## 2024-03-12 ENCOUNTER — Encounter: Payer: Self-pay | Admitting: Student

## 2024-03-12 ENCOUNTER — Ambulatory Visit: Admitting: Student

## 2024-03-12 VITALS — BP 152/79 | HR 86 | Temp 98.1°F | Ht 67.0 in | Wt 159.0 lb

## 2024-03-12 DIAGNOSIS — E1122 Type 2 diabetes mellitus with diabetic chronic kidney disease: Secondary | ICD-10-CM | POA: Diagnosis not present

## 2024-03-12 DIAGNOSIS — E1140 Type 2 diabetes mellitus with diabetic neuropathy, unspecified: Secondary | ICD-10-CM

## 2024-03-12 DIAGNOSIS — Z833 Family history of diabetes mellitus: Secondary | ICD-10-CM

## 2024-03-12 DIAGNOSIS — Z7984 Long term (current) use of oral hypoglycemic drugs: Secondary | ICD-10-CM

## 2024-03-12 DIAGNOSIS — N1832 Chronic kidney disease, stage 3b: Secondary | ICD-10-CM

## 2024-03-12 DIAGNOSIS — E559 Vitamin D deficiency, unspecified: Secondary | ICD-10-CM | POA: Diagnosis not present

## 2024-03-12 DIAGNOSIS — I129 Hypertensive chronic kidney disease with stage 1 through stage 4 chronic kidney disease, or unspecified chronic kidney disease: Secondary | ICD-10-CM

## 2024-03-12 DIAGNOSIS — Z79899 Other long term (current) drug therapy: Secondary | ICD-10-CM

## 2024-03-12 DIAGNOSIS — Z7189 Other specified counseling: Secondary | ICD-10-CM

## 2024-03-12 DIAGNOSIS — Z23 Encounter for immunization: Secondary | ICD-10-CM

## 2024-03-12 DIAGNOSIS — I10 Essential (primary) hypertension: Secondary | ICD-10-CM

## 2024-03-12 DIAGNOSIS — M62830 Muscle spasm of back: Secondary | ICD-10-CM

## 2024-03-12 MED ORDER — CYCLOBENZAPRINE HCL 5 MG PO TABS: ORAL_TABLET | ORAL | 1 refills | Status: DC

## 2024-03-12 NOTE — Assessment & Plan Note (Addendum)
 Hx of deficiency, last level 29. Taking vitamin D  supplement, will recheck today.   Orders:   Vitamin D  (25 hydroxy)

## 2024-03-12 NOTE — Progress Notes (Signed)
 Internal Medicine Clinic Attending  Case discussed with the resident at the time of the visit.  We reviewed the resident's history and exam and pertinent patient test results.  I agree with the assessment, diagnosis, and plan of care documented in the resident's note.

## 2024-03-12 NOTE — Assessment & Plan Note (Addendum)
 Hx of taking Flexeril  for years. Has had a long discussion in past OV. Taking it PRN (not taking every day or every other day). Aware of risks of taking it. At prior OV, agreeable to decrease dose to 5 mg. Will refill but use sparingly which patient agrees to.   Orders:   cyclobenzaprine  (FLEXERIL ) 5 MG tablet; TAKE 1 TABLET (5 MG) BY MOUTH EVERY OTHER DAY AS NEEDED FOR MUSCLE SPASMS

## 2024-03-12 NOTE — Assessment & Plan Note (Addendum)
 Status: Uncontrolled. BP today 152/79. Reports taking amlodipine  10 mg, losartan -HCTZ 100-25 mg.  Last BMP in 10/2023 with slight worsening of GFR to 27 from baseline of 30s (CKD 3B). Plan recheck BMP today.   Agreeable to make change if BMP stable by consolidating BP meds to Olmesartan -amlodipine -hydrochlorothiazide  (switch from losartan ). Pt and pt's daughter are aware/verbalize understanding.   Plan -BMP today  -Anticipate change to triple combo pill (olmesartan -amlodipine -hydrochlorothiazide ) to reduce pill burden  -Plan 1 month RN BP check visit

## 2024-03-12 NOTE — Assessment & Plan Note (Addendum)
 Slight decrease in GFR on last BMP in 10/2023. Will repeat BMP today to reassess renal status. Discussed potential changes including combo pill for HTN and start SGLT2 over metformin .   Orders:   Basic metabolic panel with GFR

## 2024-03-12 NOTE — Progress Notes (Addendum)
 CC: routine visit  HPI: [name] is a 89 y.o. female living with a history stated below and presents today for routine visit. Please see problem based assessment and plan for additional details.  Past Medical History:  Diagnosis Date   Anemia    Normal EGG, colonoscopy, and Sm. Bowel F/T 2006   Bereavement 08/18/2020   Bilateral hand pain 07/09/2016   Chronic knee pain    Chronic renal failure    Degenerative joint disease    Diabetes mellitus 11/05   type II   Diabetic peripheral neuropathy (HCC)    Duodenitis    Gastritis    Health care maintenance 03/15/2013   Health maintenance examination    Mammogram 10/11: Possible mass, right breast. No evidence of malignancy in left breast. Needle biopsy of left breast 12/11: No evidence of  carcinoma. Patient needs follow-up right breast diagnostic mammogram with ultrasound in 6 months.   History of diabetic gastroparesis 11/28/2017   Hyperlipidemia    Hypertension    Left knee pain 10/06/2017   Osteoporosis 09/2009   T score -2.8   Psychophysiological insomnia 11/15/2016   Vertigo     Current Outpatient Medications on File Prior to Visit  Medication Sig Dispense Refill   acetaminophen  (TYLENOL ) 325 MG tablet Take 650 mg by mouth every 6 (six) hours as needed for mild pain.     amLODipine  (NORVASC ) 10 MG tablet Take 1 tablet (10 mg total) by mouth daily. 60 tablet 3   betamethasone  valerate ointment (VALISONE ) 0.1 % Apply 1 Application topically 2 (two) times daily. 30 g 0   camphor-menthol (SARNA) lotion Apply 1 Application topically as needed for itching. 222 mL 1   cholecalciferol (VITAMIN D3) 25 MCG (1000 UNIT) tablet TAKE 1 TABLET BY MOUTH EVERY DAY 90 tablet 3   diclofenac  Sodium (VOLTAREN ) 1 % GEL Apply 2 g topically 4 (four) times daily. 100 g 3   diphenhydrAMINE -zinc acetate (BENADRYL  ITCH STOPPING) cream Apply topically 3 (three) times daily as needed for itching. 28.3 g 0   gabapentin  (NEURONTIN ) 300 MG capsule Take  1 capsule (300 mg total) by mouth 2 (two) times daily. 60 capsule 3   losartan -hydrochlorothiazide  (HYZAAR) 100-25 MG tablet Take 1 tablet by mouth daily. 90 tablet 3   metFORMIN  (GLUCOPHAGE ) 500 MG tablet TAKE 1 TABLET BY MOUTH EVERY DAY WITH BREAKFAST 90 tablet 3   omeprazole  (PRILOSEC) 20 MG capsule Take 1 capsule (20 mg total) by mouth daily. 90 capsule 3   No current facility-administered medications on file prior to visit.    Family History  Problem Relation Age of Onset   Diabetes Sister     Social History   Socioeconomic History   Marital status: Widowed    Spouse name: Not on file   Number of children: 5   Years of education: 10th grade   Highest education level: Not on file  Occupational History   Occupation: Multimedia Programmer: RETIRED    Comment: now retired  Tobacco Use   Smoking status: Never   Smokeless tobacco: Never  Substance and Sexual Activity   Alcohol use: No    Alcohol/week: 0.0 standard drinks of alcohol   Drug use: No   Sexual activity: Not on file  Other Topics Concern   Not on file  Social History Narrative   Lives by herself.   Daughter lives near by and help her with groceries.   Given diabetes card 05/03/2010   Social Drivers of  Health   Financial Resource Strain: Low Risk  (07/02/2023)   Overall Financial Resource Strain (CARDIA)    Difficulty of Paying Living Expenses: Not hard at all  Food Insecurity: No Food Insecurity (07/02/2023)   Hunger Vital Sign    Worried About Running Out of Food in the Last Year: Never true    Ran Out of Food in the Last Year: Never true  Transportation Needs: No Transportation Needs (07/02/2023)   PRAPARE - Administrator, Civil Service (Medical): No    Lack of Transportation (Non-Medical): No  Physical Activity: Inactive (07/02/2023)   Exercise Vital Sign    Days of Exercise per Week: 0 days    Minutes of Exercise per Session: 0 min  Stress: No Stress Concern Present (07/02/2023)   [employer]  of Occupational Health - Occupational Stress Questionnaire    Feeling of Stress : Not at all  Social Connections: Socially Isolated (07/02/2023)   Social Connection and Isolation Panel    Frequency of Communication with Friends and Family: More than three times a week    Frequency of Social Gatherings with Friends and Family: More than three times a week    Attends Religious Services: Never    Database Administrator or Organizations: No    Attends Banker Meetings: Never    Marital Status: Widowed  Intimate Partner Violence: Not At Risk (07/02/2023)   Humiliation, Afraid, Rape, and Kick questionnaire    Fear of Current or Ex-Partner: No    Emotionally Abused: No    Physically Abused: No    Sexually Abused: No    Review of Systems: ROS negative except for what is noted on the assessment and plan.  Vitals:   03/12/24 0853 03/12/24 0923  BP: (!) 159/80 (!) 152/79  Pulse: 87 86  Temp: 98.1 F (36.7 C)   TempSrc: Oral   SpO2: 97%   Weight: 159 lb (72.1 kg)   Height: 5' 7 (1.702 m)    Physical Exam: Constitutional: alert, sitting up in chair comfortably, in no acute distress Cardiovascular: regular rate and rhythm, systolic murmur noted, 2+ DP pulse bilaterally Pulmonary/Chest: normal work of breathing on room air, lungs clear to auscultation bilaterally Neurological: alert & oriented x 3 Skin: warm and dry, no open wounds/cuts/sores on b/l feet  Assessment & Plan:   Assessment & Plan Essential hypertension Status: Uncontrolled. BP today 152/79. Reports taking amlodipine  10 mg, losartan -HCTZ 100-25 mg.  Last BMP in 10/2023 with slight worsening of GFR to 27 from baseline of 30s (CKD 3B). Plan recheck BMP today.   Agreeable to make change if BMP stable by consolidating BP meds to Olmesartan -amlodipine -hydrochlorothiazide  (switch from losartan ). Pt and pt's daughter are aware/verbalize understanding.   Plan -BMP today  -Anticipate change to triple combo pill  (olmesartan -amlodipine -hydrochlorothiazide ) to reduce pill burden  -Plan 1 month RN BP check visit    Type 2 diabetes mellitus with diabetic neuropathy, without long-term current use of insulin (HCC) Controlled and not due for A1c recheck. On chronic metformin . Will check B12 level today. Discussion on changing metformin  for SGLT2, patient agreeable, will pend on repeat BMP today.   ADDENDUM: GFR stable but now <30, will stop metformin . A1c has been controlled, had discussion of SGLT2 holding off but potentially agreeable, had changes to BP meds for better control.   Orders:   Vitamin B12  Vitamin D  deficiency Hx of deficiency, last level 29. Taking vitamin D  supplement, will recheck today.   Orders:  Vitamin D  (25 hydroxy)  CKD stage 3b, GFR 30-44 ml/min (HCC) Slight decrease in GFR on last BMP in 10/2023. Will repeat BMP today to reassess renal status. Discussed potential changes including combo pill for HTN and start SGLT2 over metformin .   Orders:   Basic metabolic panel with GFR  Spasm of back muscles Hx of taking Flexeril  for years. Has had a long discussion in past OV. Taking it PRN (not taking every day or every other day). Aware of risks of taking it. At prior OV, agreeable to decrease dose to 5 mg. Will refill but use sparingly which patient agrees to.   Orders:   cyclobenzaprine  (FLEXERIL ) 5 MG tablet; TAKE 1 TABLET (5 MG) BY MOUTH EVERY OTHER DAY AS NEEDED FOR MUSCLE SPASMS  Encounter for immunization Orders:   Flu vaccine HIGH DOSE PF(Fluzone Trivalent)  Advance care planning Briefly discussed this today.  Daughter with patient today.  Patient lives alone but has good support with her family, patient's goal is to maintain as much independence as possible w/o compromising safety.  They have not discussed advance care planning, however they are interested.  Provided pamphlet and paperwork for advanced directive.      Return in about 4 weeks (around 04/09/2024) for  BP nurse check in 1 month, plan 2-3 months for doctor's visit.   Patient discussed with Dr. Shawn Ozell Nearing, D.O. [facility], PGY-3 Clinic Phone: [phone] Date 03/12/2024 Time 1:39 PM

## 2024-03-12 NOTE — Patient Instructions (Signed)
 Thank you, [name] for allowing us  to provide your care today. Today we discussed:  -Glad you are feeling well today.  -Blood work today, I will call with results.  -Flu shot today -Discussed changing your blood pressure medicine based off your blood work  -Discussed changing your diabetes medicine (metformin ) based off your blood work   Follow up: BP nurse check in 1 month, plan 2-3 months for doctor's visit   Remember: Should you have any questions or concerns please call the internal medicine clinic at [phone].    Sylvain Hasten, D.O. [facility]

## 2024-03-12 NOTE — Assessment & Plan Note (Addendum)
 Controlled and not due for A1c recheck. On chronic metformin . Will check B12 level today. Discussion on changing metformin  for SGLT2, patient agreeable, will pend on repeat BMP today.   ADDENDUM: GFR stable but now <30, will stop metformin . A1c has been controlled, had discussion of SGLT2 holding off but potentially agreeable, had changes to BP meds for better control.   Orders:   Vitamin B12

## 2024-03-13 LAB — BASIC METABOLIC PANEL WITH GFR
BUN/Creatinine Ratio: 19 (ref 12–28)
BUN: 32 mg/dL (ref 10–36)
CO2: 22 mmol/L (ref 20–29)
Calcium: 10 mg/dL (ref 8.7–10.3)
Chloride: 104 mmol/L (ref 96–106)
Creatinine, Ser: 1.71 mg/dL — ABNORMAL HIGH (ref 0.57–1.00)
Glucose: 102 mg/dL — ABNORMAL HIGH (ref 70–99)
Potassium: 4.1 mmol/L (ref 3.5–5.2)
Sodium: 140 mmol/L (ref 134–144)
eGFR: 28 mL/min/1.73 — ABNORMAL LOW (ref >59)

## 2024-03-13 LAB — VITAMIN B12: Vitamin B-12: 517 pg/mL (ref 232–1245)

## 2024-03-13 LAB — VITAMIN D 25 HYDROXY (VIT D DEFICIENCY, FRACTURES): Vit D, 25-Hydroxy: 39 ng/mL (ref 30.0–100.0)

## 2024-03-15 ENCOUNTER — Ambulatory Visit: Payer: Self-pay | Admitting: Student

## 2024-03-15 MED ORDER — OLMESARTAN-AMLODIPINE-HCTZ 40-10-25 MG PO TABS
1.0000 | ORAL_TABLET | Freq: Every day | ORAL | 3 refills | Status: AC
Start: 2024-03-15 — End: ?

## 2024-03-15 NOTE — Addendum Note (Signed)
 Addended by: ELICIA SHARPER on: 03/15/2024 06:13 PM   Modules accepted: Orders

## 2024-03-16 NOTE — Addendum Note (Signed)
 Addended by: ELICIA SHARPER on: 03/16/2024 04:27 PM   Modules accepted: Orders

## 2024-04-12 ENCOUNTER — Ambulatory Visit

## 2024-04-12 MED ORDER — EMPAGLIFLOZIN 10 MG PO TABS: 10.0000 mg | ORAL_TABLET | Freq: Every day | ORAL | 3 refills | Status: AC

## 2024-04-12 NOTE — Progress Notes (Unsigned)
 Discussed patient with Kenneth Peak today. Patient here for BP check BP's 150s/70s on amlodipine  10, omlesartan 40, hydrochlorothiazide  25mg  daily combo pill Last BMP was stable  Will add Jardiance  10mg  daily. Sent to her pharmacy.  RTC in February for HTN, T2DM follow up.  Check BMP then.

## 2024-04-12 NOTE — Progress Notes (Unsigned)
° ° °  Wanda Mclaughlin presented today for blood pressure check. Patient is prescribed blood pressure medications and I confirmed that patient did take their blood pressure medication prior to todays appointment. Blood pressure was taken in the usual and appropriate manner using an automated BP cuff.     Vitals:   04/12/24 1001 04/12/24 1006  BP: (!) 153/70 (!) 151/73      Results of todays visit will be routed to Dr. Lovie for review and further management. Spoke with Dr. Lovie about the blood pressure results.  Changes made.  Patient to start Jardiance  10 mg and to return in February for an appointment.  Explained to patient that her blood pressure are not where the doctors would like.  Still elevated.  Would like for her to start taking Jardiance  10 mg daily.  Which will help both her blood pressure and her Diabetes.  Patient and daughter were agreeable.  Medication to be sent to the [facility] on [address].

## 2024-04-14 NOTE — Progress Notes (Signed)
 I agree with RN's assessment and plan. Please see my separate note.

## 2024-05-08 ENCOUNTER — Other Ambulatory Visit: Payer: Self-pay | Admitting: Student

## 2024-05-08 DIAGNOSIS — M62830 Muscle spasm of back: Secondary | ICD-10-CM

## 2024-05-13 ENCOUNTER — Telehealth: Payer: Self-pay

## 2024-05-13 NOTE — Telephone Encounter (Signed)
Appointment has been scheduled for 1/27

## 2024-05-13 NOTE — Telephone Encounter (Signed)
 I called the patient to schedule a appointment for her Prolia Injection with Dr.Bender. I was unable to reach the patient. I lvm for her to give us  a call back and to specifically ask for Wanda Mclaughlin.  (DO NOT SCHEDULE)

## 2024-05-13 NOTE — Telephone Encounter (Signed)
 Patients daughter Jacquelyn called she stated she would have to speak with her mother before scheduling a appointment. Jacquelyn will give us  a call back.

## 2024-05-25 ENCOUNTER — Ambulatory Visit: Payer: Self-pay | Admitting: Student

## 2024-06-09 ENCOUNTER — Ambulatory Visit: Payer: Self-pay | Admitting: Student

## 2024-07-07 ENCOUNTER — Ambulatory Visit
